# Patient Record
Sex: Male | Born: 1960 | Race: White | Hispanic: No | Marital: Single | Smoking: Current every day smoker
Health system: Southern US, Community
[De-identification: ages and names within clinical notes are randomized; demographics above are authoritative.]

## PROBLEM LIST (undated history)

## (undated) DIAGNOSIS — H9190 Unspecified hearing loss, unspecified ear: Secondary | ICD-10-CM

## (undated) DIAGNOSIS — I10 Essential (primary) hypertension: Secondary | ICD-10-CM

## (undated) DIAGNOSIS — F319 Bipolar disorder, unspecified: Secondary | ICD-10-CM

## (undated) DIAGNOSIS — R569 Unspecified convulsions: Secondary | ICD-10-CM

## (undated) DIAGNOSIS — R011 Cardiac murmur, unspecified: Secondary | ICD-10-CM

## (undated) DIAGNOSIS — F1010 Alcohol abuse, uncomplicated: Secondary | ICD-10-CM

## (undated) DIAGNOSIS — F329 Major depressive disorder, single episode, unspecified: Secondary | ICD-10-CM

## (undated) DIAGNOSIS — B1920 Unspecified viral hepatitis C without hepatic coma: Secondary | ICD-10-CM

## (undated) DIAGNOSIS — F32A Depression, unspecified: Secondary | ICD-10-CM

## (undated) HISTORY — DX: Depression, unspecified: F32.A

## (undated) HISTORY — PX: KNEE SURGERY: SHX244

## (undated) HISTORY — DX: Major depressive disorder, single episode, unspecified: F32.9

## (undated) HISTORY — DX: Bipolar disorder, unspecified: F31.9

## (undated) HISTORY — PX: HIP FRACTURE SURGERY: SHX118

---

## 2015-12-27 ENCOUNTER — Encounter (HOSPITAL_COMMUNITY): Payer: Self-pay | Admitting: *Deleted

## 2015-12-27 ENCOUNTER — Emergency Department (HOSPITAL_COMMUNITY)
Admission: EM | Admit: 2015-12-27 | Discharge: 2015-12-28 | Disposition: A | Discharge status: Other Acute Inpatient Hospital | Payer: Medicaid Other | Attending: Emergency Medicine | Admitting: Emergency Medicine

## 2015-12-27 DIAGNOSIS — Z87828 Personal history of other (healed) physical injury and trauma: Secondary | ICD-10-CM | POA: Insufficient documentation

## 2015-12-27 DIAGNOSIS — I2510 Atherosclerotic heart disease of native coronary artery without angina pectoris: Secondary | ICD-10-CM | POA: Insufficient documentation

## 2015-12-27 DIAGNOSIS — Z880 Allergy status to penicillin: Secondary | ICD-10-CM | POA: Insufficient documentation

## 2015-12-27 DIAGNOSIS — F322 Major depressive disorder, single episode, severe without psychotic features: Secondary | ICD-10-CM | POA: Insufficient documentation

## 2015-12-27 DIAGNOSIS — I10 Essential (primary) hypertension: Secondary | ICD-10-CM | POA: Insufficient documentation

## 2015-12-27 DIAGNOSIS — J029 Acute pharyngitis, unspecified: Secondary | ICD-10-CM | POA: Insufficient documentation

## 2015-12-27 DIAGNOSIS — F1010 Alcohol abuse, uncomplicated: Secondary | ICD-10-CM | POA: Insufficient documentation

## 2015-12-27 DIAGNOSIS — F17200 Nicotine dependence, unspecified, uncomplicated: Secondary | ICD-10-CM | POA: Insufficient documentation

## 2015-12-27 HISTORY — DX: Alcohol abuse, uncomplicated: F10.10

## 2015-12-27 HISTORY — DX: Essential (primary) hypertension: I10

## 2015-12-27 HISTORY — DX: Unspecified hearing loss, unspecified ear: H91.90

## 2015-12-27 LAB — RAPID URINE DRUG SCREEN, HOSP PERFORMED
Amphetamines: NOT DETECTED
Barbiturates: NOT DETECTED
Benzodiazepines: NOT DETECTED
COCAINE: NOT DETECTED
OPIATES: NOT DETECTED
TETRAHYDROCANNABINOL: NOT DETECTED

## 2015-12-27 LAB — COMPREHENSIVE METABOLIC PANEL
ALBUMIN: 3.6 g/dL (ref 3.5–5.0)
ALK PHOS: 106 U/L (ref 38–126)
ALT: 35 U/L (ref 17–63)
ANION GAP: 13 (ref 5–15)
AST: 21 U/L (ref 15–41)
BILIRUBIN TOTAL: 0.2 mg/dL — AB (ref 0.3–1.2)
BUN: 12 mg/dL (ref 6–20)
CALCIUM: 9 mg/dL (ref 8.9–10.3)
CO2: 24 mmol/L (ref 22–32)
Chloride: 103 mmol/L (ref 101–111)
Creatinine, Ser: 0.72 mg/dL (ref 0.61–1.24)
GFR calc Af Amer: >60 mL/min (ref >60)
GLUCOSE: 157 mg/dL — AB (ref 65–99)
POTASSIUM: 3.7 mmol/L (ref 3.5–5.1)
SODIUM: 140 mmol/L (ref 135–145)
TOTAL PROTEIN: 7 g/dL (ref 6.5–8.1)

## 2015-12-27 LAB — CBC
HCT: 46.5 % (ref 39.0–52.0)
HEMOGLOBIN: 15.5 g/dL (ref 13.0–17.0)
MCH: 30.7 pg (ref 26.0–34.0)
MCHC: 33.3 g/dL (ref 30.0–36.0)
MCV: 92.1 fL (ref 78.0–100.0)
PLATELETS: 267 10*3/uL (ref 150–400)
RBC: 5.05 MIL/uL (ref 4.22–5.81)
RDW: 13.2 % (ref 11.5–15.5)
WBC: 12.2 10*3/uL — ABNORMAL HIGH (ref 4.0–10.5)

## 2015-12-27 LAB — ETHANOL: ALCOHOL ETHYL (B): 222 mg/dL — AB (ref <5)

## 2015-12-27 MED ORDER — LORAZEPAM 1 MG PO TABS
1.0000 mg | ORAL_TABLET | Freq: Once | ORAL | Status: AC
  Administered 2015-12-27: 1 mg via ORAL
  Filled 2015-12-27: qty 1
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
## 2015-12-27 NOTE — ED Notes (Signed)
He had a 5th of vodka just before he came here

## 2015-12-27 NOTE — ED Notes (Signed)
Pt advised he is having a panic attack. States he lives with it constantly due to PTSD. No noted tachycardia or SOB. Vitals at baseline.  Turned down lights and pt appears more comfortable and stable.

## 2015-12-27 NOTE — Discharge Instructions (Signed)
If you were given medicines take as directed.  If you are on coumadin or contraceptives realize their levels and effectiveness is altered by many different medicines.  If you have any reaction (rash, tongues swelling, other) to the medicines stop taking and see a physician.    If your blood pressure was elevated in the ER make sure you follow up for management with a primary doctor or return for chest pain, shortness of breath or stroke symptoms.  Please follow up as directed and return to the ER or see a physician for new or worsening symptoms.  Thank you. Filed Vitals:   12/27/15 2136  BP: 125/82  Pulse: 100  Temp: 97.8 F (36.6 C)  TempSrc: Oral  Resp: 18  Weight: 231 lb 8 oz (105.008 kg)  SpO2: 95%

## 2015-12-27 NOTE — ED Notes (Signed)
Pt endorses wanting to go to sleep and not wake up. Denies suicidal ideation. Denies suicide plan.

## 2015-12-27 NOTE — ED Notes (Signed)
The pt has  Been in the bed for 2 weeks.  He has mouth abscesses and he has been drinking alcohol
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
## 2015-12-28 ENCOUNTER — Inpatient Hospital Stay (HOSPITAL_COMMUNITY)
Admission: AD | Admit: 2015-12-28 | Discharge: 2016-01-01 | DRG: 885 | Disposition: A | Discharge status: Home / Self Care | Payer: Medicaid Other | Source: Intra-hospital | Attending: Psychiatry | Admitting: Psychiatry

## 2015-12-28 ENCOUNTER — Encounter (HOSPITAL_COMMUNITY): Payer: Self-pay | Admitting: *Deleted

## 2015-12-28 DIAGNOSIS — Z23 Encounter for immunization: Secondary | ICD-10-CM

## 2015-12-28 DIAGNOSIS — I2510 Atherosclerotic heart disease of native coronary artery without angina pectoris: Secondary | ICD-10-CM | POA: Diagnosis present

## 2015-12-28 DIAGNOSIS — F410 Panic disorder [episodic paroxysmal anxiety] without agoraphobia: Secondary | ICD-10-CM | POA: Diagnosis present

## 2015-12-28 DIAGNOSIS — F10230 Alcohol dependence with withdrawal, uncomplicated: Secondary | ICD-10-CM | POA: Diagnosis present

## 2015-12-28 DIAGNOSIS — F17210 Nicotine dependence, cigarettes, uncomplicated: Secondary | ICD-10-CM | POA: Diagnosis present

## 2015-12-28 DIAGNOSIS — F332 Major depressive disorder, recurrent severe without psychotic features: Secondary | ICD-10-CM | POA: Diagnosis present

## 2015-12-28 DIAGNOSIS — I10 Essential (primary) hypertension: Secondary | ICD-10-CM | POA: Diagnosis present

## 2015-12-28 DIAGNOSIS — G4710 Hypersomnia, unspecified: Secondary | ICD-10-CM | POA: Diagnosis present

## 2015-12-28 DIAGNOSIS — F1020 Alcohol dependence, uncomplicated: Secondary | ICD-10-CM | POA: Diagnosis present

## 2015-12-28 DIAGNOSIS — F314 Bipolar disorder, current episode depressed, severe, without psychotic features: Secondary | ICD-10-CM | POA: Diagnosis present

## 2015-12-28 HISTORY — DX: Cardiac murmur, unspecified: R01.1

## 2015-12-28 LAB — RAPID STREP SCREEN (MED CTR MEBANE ONLY): STREPTOCOCCUS, GROUP A SCREEN (DIRECT): NEGATIVE

## 2015-12-28 MED ORDER — CHLORDIAZEPOXIDE HCL 25 MG PO CAPS: 25.0000 mg | ORAL_CAPSULE | Freq: Every day | ORAL | Status: DC

## 2015-12-28 MED ORDER — PNEUMOCOCCAL VAC POLYVALENT 25 MCG/0.5ML IJ INJ
0.5000 mL | INJECTION | INTRAMUSCULAR | Status: AC
  Administered 2015-12-29: 0.5 mL via INTRAMUSCULAR

## 2015-12-28 MED ORDER — ADULT MULTIVITAMIN W/MINERALS CH
1.0000 | ORAL_TABLET | Freq: Every day | ORAL | Status: DC
  Administered 2015-12-28: 1 via ORAL
  Filled 2015-12-28: qty 1

## 2015-12-28 MED ORDER — HYDROXYZINE HCL 25 MG PO TABS
25.0000 mg | ORAL_TABLET | Freq: Four times a day (QID) | ORAL | Status: AC | PRN
  Administered 2015-12-29 – 2015-12-31 (×2): 25 mg via ORAL
  Filled 2015-12-28 (×2): qty 1

## 2015-12-28 MED ORDER — ALUM & MAG HYDROXIDE-SIMETH 200-200-20 MG/5ML PO SUSP: 30.0000 mL | ORAL | Status: DC | PRN

## 2015-12-28 MED ORDER — MAGNESIUM HYDROXIDE 400 MG/5ML PO SUSP: 30.0000 mL | Freq: Every day | ORAL | Status: DC | PRN

## 2015-12-28 MED ORDER — NICOTINE 21 MG/24HR TD PT24
21.0000 mg | MEDICATED_PATCH | Freq: Once | TRANSDERMAL | Status: DC
  Administered 2015-12-28: 21 mg via TRANSDERMAL
  Filled 2015-12-28: qty 1

## 2015-12-28 MED ORDER — LOPERAMIDE HCL 2 MG PO CAPS: 2.0000 mg | ORAL_CAPSULE | ORAL | Status: AC | PRN

## 2015-12-28 MED ORDER — CHLORDIAZEPOXIDE HCL 25 MG PO CAPS: 25.0000 mg | ORAL_CAPSULE | Freq: Three times a day (TID) | ORAL | Status: DC

## 2015-12-28 MED ORDER — LORAZEPAM 2 MG/ML IJ SOLN
1.0000 mg | Freq: Four times a day (QID) | INTRAMUSCULAR | Status: DC | PRN
  Administered 2015-12-28: 1 mg via INTRAVENOUS
  Filled 2015-12-28 (×2): qty 1

## 2015-12-28 MED ORDER — FOLIC ACID 1 MG PO TABS
1.0000 mg | ORAL_TABLET | Freq: Every day | ORAL | Status: DC
  Administered 2015-12-28: 1 mg via ORAL
  Filled 2015-12-28: qty 1

## 2015-12-28 MED ORDER — CHLORDIAZEPOXIDE HCL 25 MG PO CAPS
25.0000 mg | ORAL_CAPSULE | Freq: Four times a day (QID) | ORAL | Status: DC
  Administered 2015-12-28 – 2015-12-29 (×3): 25 mg via ORAL
  Filled 2015-12-28 (×3): qty 1

## 2015-12-28 MED ORDER — ENSURE ENLIVE PO LIQD
237.0000 mL | Freq: Two times a day (BID) | ORAL | Status: DC
  Administered 2015-12-31: 237 mL via ORAL

## 2015-12-28 MED ORDER — LORAZEPAM 1 MG PO TABS: 0.0000 mg | ORAL_TABLET | Freq: Two times a day (BID) | ORAL | Status: DC

## 2015-12-28 MED ORDER — VITAMIN B-1 100 MG PO TABS
100.0000 mg | ORAL_TABLET | Freq: Every day | ORAL | Status: DC
  Administered 2015-12-29 – 2016-01-01 (×4): 100 mg via ORAL
  Filled 2015-12-28 (×6): qty 1

## 2015-12-28 MED ORDER — THIAMINE HCL 100 MG/ML IJ SOLN
100.0000 mg | Freq: Every day | INTRAMUSCULAR | Status: DC
  Administered 2015-12-28: 100 mg via INTRAVENOUS
  Filled 2015-12-28: qty 2

## 2015-12-28 MED ORDER — PANTOPRAZOLE SODIUM 40 MG IV SOLR
40.0000 mg | Freq: Once | INTRAVENOUS | Status: AC
  Administered 2015-12-28: 40 mg via INTRAVENOUS
  Filled 2015-12-28: qty 40

## 2015-12-28 MED ORDER — NICOTINE 21 MG/24HR TD PT24: 21.0000 mg | MEDICATED_PATCH | Freq: Every day | TRANSDERMAL | Status: DC

## 2015-12-28 MED ORDER — ADULT MULTIVITAMIN W/MINERALS CH
1.0000 | ORAL_TABLET | Freq: Every day | ORAL | Status: DC
  Administered 2015-12-29: 1 via ORAL
  Filled 2015-12-28 (×2): qty 1

## 2015-12-28 MED ORDER — LORAZEPAM 2 MG/ML IJ SOLN
2.0000 mg | Freq: Once | INTRAMUSCULAR | Status: AC
  Administered 2015-12-28: 2 mg via INTRAVENOUS

## 2015-12-28 MED ORDER — ONDANSETRON HCL 4 MG PO TABS: 4.0000 mg | ORAL_TABLET | Freq: Three times a day (TID) | ORAL | Status: DC | PRN

## 2015-12-28 MED ORDER — LORAZEPAM 1 MG PO TABS: 1.0000 mg | ORAL_TABLET | Freq: Four times a day (QID) | ORAL | Status: DC | PRN

## 2015-12-28 MED ORDER — THIAMINE HCL 100 MG/ML IJ SOLN: 100.0000 mg | Freq: Once | INTRAMUSCULAR | Status: DC

## 2015-12-28 MED ORDER — CHLORDIAZEPOXIDE HCL 25 MG PO CAPS: 25.0000 mg | ORAL_CAPSULE | ORAL | Status: DC

## 2015-12-28 MED ORDER — SODIUM CHLORIDE 0.9 % IV BOLUS (SEPSIS)
1000.0000 mL | Freq: Once | INTRAVENOUS | Status: AC
  Administered 2015-12-28: 1000 mL via INTRAVENOUS

## 2015-12-28 MED ORDER — ACETAMINOPHEN 325 MG PO TABS: 650.0000 mg | ORAL_TABLET | ORAL | Status: DC | PRN

## 2015-12-28 MED ORDER — ACETAMINOPHEN 325 MG PO TABS
650.0000 mg | ORAL_TABLET | Freq: Four times a day (QID) | ORAL | Status: DC | PRN
  Administered 2015-12-28 – 2015-12-30 (×3): 650 mg via ORAL
  Filled 2015-12-28 (×3): qty 2

## 2015-12-28 MED ORDER — VITAMIN B-1 100 MG PO TABS: 100.0000 mg | ORAL_TABLET | Freq: Every day | ORAL | Status: DC

## 2015-12-28 MED ORDER — ONDANSETRON 4 MG PO TBDP
4.0000 mg | ORAL_TABLET | Freq: Four times a day (QID) | ORAL | Status: AC | PRN
  Administered 2015-12-29: 4 mg via ORAL
  Filled 2015-12-28: qty 1

## 2015-12-28 MED ORDER — NICOTINE 21 MG/24HR TD PT24
21.0000 mg | MEDICATED_PATCH | Freq: Every day | TRANSDERMAL | Status: DC
  Administered 2015-12-29 – 2016-01-01 (×4): 21 mg via TRANSDERMAL
  Filled 2015-12-28 (×7): qty 1

## 2015-12-28 MED ORDER — LORAZEPAM 1 MG PO TABS
0.0000 mg | ORAL_TABLET | Freq: Four times a day (QID) | ORAL | Status: DC
  Administered 2015-12-28: 1 mg via ORAL
  Filled 2015-12-28: qty 1

## 2015-12-28 MED ORDER — CHLORDIAZEPOXIDE HCL 25 MG PO CAPS: 25.0000 mg | ORAL_CAPSULE | Freq: Four times a day (QID) | ORAL | Status: DC | PRN

## 2015-12-28 MED ORDER — ONDANSETRON HCL 4 MG/2ML IJ SOLN
4.0000 mg | INTRAMUSCULAR | Status: AC | PRN
  Administered 2015-12-28 (×2): 4 mg via INTRAVENOUS
  Filled 2015-12-28 (×2): qty 2

## 2015-12-28 MED ORDER — LORAZEPAM 1 MG PO TABS: 1.0000 mg | ORAL_TABLET | Freq: Three times a day (TID) | ORAL | Status: DC | PRN

## 2015-12-28 MED ORDER — INFLUENZA VAC SPLIT QUAD 0.5 ML IM SUSY
0.5000 mL | PREFILLED_SYRINGE | INTRAMUSCULAR | Status: AC
  Administered 2015-12-29: 0.5 mL via INTRAMUSCULAR
  Filled 2015-12-28: qty 0.5

## 2015-12-28 NOTE — ED Notes (Signed)
Pt signed consent forms - copy faxed to [facility] and copy sent to medical records.

## 2015-12-28 NOTE — ED Notes (Addendum)
Ginger Ale given to pt. Pt woke briefly and advised "OK" when asked to drink po fluid given. Pt then returned to sleeping.

## 2015-12-28 NOTE — ED Notes (Signed)
Pt spoke w/girlfriend on phone and returned to room.

## 2015-12-28 NOTE — ED Notes (Signed)
Woke pt - pt took a few sips of Ginger Ale and returned to sleeping.

## 2015-12-28 NOTE — Progress Notes (Signed)
Pt accepted at [facility], 303-2 Dr. Dub Mikes Attending, Maryjean Morn accepting pending medical clearance from ED.  Seward Speck [facility] Behavioral Health Disposition CSW [phone]

## 2015-12-28 NOTE — ED Notes (Addendum)
Pt attempted to eat and started violently vomiting.  Dr Clydene Pugh notified.

## 2015-12-28 NOTE — ED Notes (Signed)
Pt to receive meal tray. 

## 2015-12-28 NOTE — ED Notes (Signed)
Attempted to call report to [facility] - no answer x 2.

## 2015-12-28 NOTE — Tx Team (Signed)
Initial Interdisciplinary Treatment Plan   PATIENT STRESSORS: Financial difficulties Health problems Substance abuse   PATIENT STRENGTHS: Ability for insight Active sense of humor Average or above average intelligence Capable of independent living Communication skills General fund of knowledge Supportive family/friends   PROBLEM LIST: Problem List/Patient Goals Date to be addressed Date deferred Reason deferred Estimated date of resolution  "coping mechanisms for my alcohol"  12/28/15     "my depression"  12/28/15           SI 12/28/15     Substance abuse 12/28/15     depression 12/28/15                        DISCHARGE CRITERIA:  Improved stabilization in mood, thinking, and/or behavior Withdrawal symptoms are absent or subacute and managed without 24-hour nursing intervention  PRELIMINARY DISCHARGE PLAN: Attend 12-step recovery group Outpatient therapy  PATIENT/FAMIILY INVOLVEMENT: This treatment plan has been presented to and reviewed with the patient, [employer].  The patient and family have been given the opportunity to ask questions and make suggestions.  Arrie Aran 12/28/2015, 10:47 PM

## 2015-12-28 NOTE — Progress Notes (Addendum)
Disposition CSW faxed referral to [facility] for possible detox bed. [facility] declined patient stating he needed higher level of care.  Seward Speck [facility] Behavioral Health Disposition CSW [phone]

## 2015-12-28 NOTE — ED Notes (Signed)
Woke pt from sleeping. States continues to feel some nausea and headache and states "room felt like it was spinning a few minutes ago". IV fluids continuing. Pt returned to sleeping rather quickly. Snorous respirations noted intermittently.

## 2015-12-28 NOTE — ED Notes (Signed)
Pt placed on 2L [place] for sats of 88% while sleeping.

## 2015-12-28 NOTE — ED Notes (Signed)
Pt is sleeping at present.

## 2015-12-28 NOTE — ED Notes (Signed)
Pt sleeping. 

## 2015-12-28 NOTE — ED Notes (Signed)
Per [facility], requested for report to be given after shift change.
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
## 2015-12-28 NOTE — BH Assessment (Signed)
Assessment Note  Steven Daniels is an 55 y.o. male, who reports driving himself to [facility].  Patient presented orientated x4, mood "depressed, and affect congruent with mood.  The Veteran expressed vague SI in terms of "if I go to sleep and don't wake up, I will be okay with that."  He denied HI and AVH.  Patient reports remaining in bed for the past 3 weeks consuming alcohol and not taking care of daily hygiene needs.  He reports his Father died in Jan 25, 2017and that is when his alcohol consumption increased.  The Patient reports his Spouse was recently hospitalized for schizophrenia.  Patient reports being on SSI for PTSD.  He reports mental health diagnoses of PTSD and Bipolar I.  Patient reports previously being prescribed Prozac, Neurontin, and Trazodone, however he has not been med compliant for " a long time."  Patient reports consuming a 5th of liquor daily starting at wakening, if not he will experience alcohol withdrawal symptoms.  Patient reports snorting and smoking Meth with last use a month ago.  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   He reports participating in substance rehab in 2000.  The Patient reports no history of psychiatric hospitalization.  Patient reports wanting substance use rehabilitation.       .     Diagnosis: Alcohol Use Disorder, severe, Bipolar I Disorder, current episode depressed, severe  Past Medical History:  Past Medical History  Diagnosis Date  . Hypertension   . Coronary artery disease     History reviewed. No pertinent past surgical history.  Family History: No family history on file.  Social History:  reports that he has been smoking.  He does not have any smokeless tobacco history on file. He reports that he drinks alcohol. His drug history is not on file.  Additional Social History:     CIWA: CIWA-Ar BP: 125/82 mmHg Pulse Rate: 100 COWS:    Allergies:  Allergies  Allergen Reactions  . Other Shortness Of Breath    Reaction to  cats  . Apple Other (See Comments)    Raw apples cause gum swelling  . Cherry Other (See Comments)    Raw cherries cause gum swelling  . Lisinopril Hives    Possible reaction to lisinopril  . Penicillins Other (See Comments)    Allergic reaction as a 55 year old  . Plum Pulp Other (See Comments)    Raw plums cause gum swelling    Home Medications:  (Not in a hospital admission)  OB/GYN Status:  No LMP for male patient.  General Assessment Data Location of Assessment: [facility] ED TTS Assessment: In system Is this a Tele or Face-to-Face Assessment?: Tele Assessment Is this an Initial Assessment or a Re-assessment for this encounter?: Initial Assessment Marital status: Married [place] name: N/A Is patient pregnant?: No Pregnancy Status: No Living Arrangements: Spouse/significant other Can pt return to current living arrangement?: Yes Admission Status: Voluntary Is patient capable of signing voluntary admission?: Yes Referral Source: Self/Family/Friend Insurance type: Medicaid, [place]  Medical Screening Exam [facility] Walk-in ONLY) Medical Exam completed: Yes  Crisis Care Plan Living Arrangements: Spouse/significant other Legal Guardian: Other: (Self) Name of Psychiatrist: None Name of Therapist: None  Education Status Is patient currently in school?: No Current Grade: N/A Highest grade of school patient has completed: 12th Name of school: N/A Contact person: N/A  Risk to self with the past 6 months Suicidal Ideation: Yes-Currently Present (Vague SI) Has patient been a risk to self  within the past 6 months prior to admission? : No Suicidal Intent: No Has patient had any suicidal intent within the past 6 months prior to admission? : No Is patient at risk for suicide?: No Suicidal Plan?: No Has patient had any suicidal plan within the past 6 months prior to admission? : No Access to Means: No What has been your use of drugs/alcohol within the last 12 months?: ETOH, Meth Previous  Attempts/Gestures: No How many times?: 0 Other Self Harm Risks: No Triggers for Past Attempts: None known Intentional Self Injurious Behavior: None Family Suicide History: Unknown Recent stressful life event(s): Loss (Comment) (Father died 11/10/2015 and Spouse hospitalized for MH) Persecutory voices/beliefs?: No Depression: Yes Depression Symptoms: Feeling worthless/self pity, Loss of interest in usual pleasures, Fatigue Substance abuse history and/or treatment for substance abuse?: Yes (HX of THC and IV herion) Suicide prevention information given to non-admitted patients: Not applicable  Risk to Others within the past 6 months Homicidal Ideation: No Does patient have any lifetime risk of violence toward others beyond the six months prior to admission? : No Thoughts of Harm to Others: No Current Homicidal Intent: No Current Homicidal Plan: No Access to Homicidal Means: No Identified Victim: N/A History of harm to others?: No Assessment of Violence: None Noted Violent Behavior Description: N/A Does patient have access to weapons?: No (Patient denied) Criminal Charges Pending?: No Does patient have a court date: No Is patient on probation?: No  Psychosis Hallucinations: None noted Delusions: None noted  Mental Status Report Appearance/Hygiene: In hospital gown Eye Contact: Good Motor Activity: Unremarkable Speech: Logical/coherent Level of Consciousness: Alert Mood: Depressed, Other (Comment) (Hopeless) Affect: Depressed Anxiety Level: Minimal Thought Processes: Coherent, Relevant Judgement: Impaired Orientation: Person, Place, Time, Situation Obsessive Compulsive Thoughts/Behaviors: None  Cognitive Functioning Concentration: Decreased Memory: Recent Intact, Remote Intact IQ: Average Insight: Poor Impulse Control: Poor Appetite: Good Weight Loss: 0 Weight Gain: 0 Sleep: Increased Total Hours of Sleep: 12 Vegetative Symptoms: Staying in bed, Not  bathing  ADLScreening [facility] Assessment Services) Patient's cognitive ability adequate to safely complete daily activities?: No Patient able to express need for assistance with ADLs?: Yes Independently performs ADLs?: Yes (appropriate for developmental age)  Prior Inpatient Therapy Prior Inpatient Therapy: Yes (Substance use residential treatment) Prior Therapy Dates: 2000 Prior Therapy Facilty/Provider(s): Unknown Reason for Treatment: alcohol dependency  Prior Outpatient Therapy Prior Outpatient Therapy: No Prior Therapy Dates: N/A Prior Therapy Facilty/Provider(s): N/A Reason for Treatment: N/A Does patient have an ACCT team?: No Does patient have Intensive In-House Services?  : No Does patient have [facility] services? : No Does patient have P4CC services?: No  ADL Screening (condition at time of admission) Patient's cognitive ability adequate to safely complete daily activities?: No Is the patient deaf or have difficulty hearing?: No Does the patient have difficulty seeing, even when wearing glasses/contacts?: No Does the patient have difficulty concentrating, remembering, or making decisions?: No Patient able to express need for assistance with ADLs?: Yes Does the patient have difficulty dressing or bathing?: No Independently performs ADLs?: Yes (appropriate for developmental age) Does the patient have difficulty walking or climbing stairs?: No Weakness of Legs: None Weakness of Arms/Hands: None  Home Assistive Devices/Equipment Home Assistive Devices/Equipment: None    Abuse/Neglect Assessment (Assessment to be complete while patient is alone) Physical Abuse: Denies Verbal Abuse: Denies Sexual Abuse: Denies Exploitation of patient/patient's resources: Denies Self-Neglect: Denies Values / Beliefs Cultural Requests During Hospitalization: None Spiritual Requests During Hospitalization: None   Advance Directives (For Healthcare) Does  patient have an advance  directive?: No Would patient like information on creating an advanced directive?: No - patient declined information    Additional Information 1:1 In Past 12 Months?: No CIRT Risk: No Elopement Risk: No Does patient have medical clearance?: Yes     Disposition:  Disposition Initial Assessment Completed for this Encounter: Yes Disposition of Patient: Other dispositions (Pending) Other disposition(s): Other (Comment) (Pending)  On Site Evaluation by:   Reviewed with Physician:    Dey-Johnson,Emillio Ngo 12/28/2015 12:04 AM

## 2015-12-28 NOTE — ED Notes (Signed)
Security at bedside to wand pt. 

## 2015-12-28 NOTE — ED Notes (Addendum)
Pt more alert now and CIWA has decreased. In agreement to go to [facility].

## 2015-12-28 NOTE — ED Notes (Signed)
Per Simonne Come, [facility] - pt has bed 303-2 - Dr Dub Mikes when cleared.

## 2015-12-29 ENCOUNTER — Encounter (HOSPITAL_COMMUNITY): Payer: Self-pay | Admitting: Psychiatry

## 2015-12-29 DIAGNOSIS — F1020 Alcohol dependence, uncomplicated: Secondary | ICD-10-CM | POA: Diagnosis present

## 2015-12-29 DIAGNOSIS — F332 Major depressive disorder, recurrent severe without psychotic features: Principal | ICD-10-CM | POA: Diagnosis present

## 2015-12-29 DIAGNOSIS — F10230 Alcohol dependence with withdrawal, uncomplicated: Secondary | ICD-10-CM

## 2015-12-29 MED ORDER — LORAZEPAM 1 MG PO TABS
1.0000 mg | ORAL_TABLET | Freq: Every day | ORAL | Status: AC
  Administered 2016-01-01: 1 mg via ORAL
  Filled 2015-12-29: qty 1

## 2015-12-29 MED ORDER — THIAMINE HCL 100 MG/ML IJ SOLN
100.0000 mg | Freq: Once | INTRAMUSCULAR | Status: DC
Start: 2015-12-29 — End: 2015-12-29

## 2015-12-29 MED ORDER — ADULT MULTIVITAMIN W/MINERALS CH
1.0000 | ORAL_TABLET | Freq: Every day | ORAL | Status: DC
  Administered 2015-12-30 – 2016-01-01 (×3): 1 via ORAL
  Filled 2015-12-29 (×6): qty 1

## 2015-12-29 MED ORDER — LORAZEPAM 1 MG PO TABS
1.0000 mg | ORAL_TABLET | Freq: Three times a day (TID) | ORAL | Status: AC
  Administered 2015-12-30 (×3): 1 mg via ORAL
  Filled 2015-12-29 (×2): qty 1

## 2015-12-29 MED ORDER — LORAZEPAM 1 MG PO TABS: 1.0000 mg | ORAL_TABLET | Freq: Four times a day (QID) | ORAL | Status: DC | PRN

## 2015-12-29 MED ORDER — VITAMIN B-1 100 MG PO TABS
100.0000 mg | ORAL_TABLET | Freq: Every day | ORAL | Status: DC
  Filled 2015-12-29: qty 1

## 2015-12-29 MED ORDER — SULFAMETHOXAZOLE-TRIMETHOPRIM 800-160 MG PO TABS
1.0000 | ORAL_TABLET | Freq: Two times a day (BID) | ORAL | Status: DC
  Filled 2015-12-29 (×5): qty 1

## 2015-12-29 MED ORDER — ONDANSETRON 4 MG PO TBDP: 4.0000 mg | ORAL_TABLET | Freq: Four times a day (QID) | ORAL | Status: DC | PRN

## 2015-12-29 MED ORDER — LOPERAMIDE HCL 2 MG PO CAPS: 2.0000 mg | ORAL_CAPSULE | ORAL | Status: DC | PRN

## 2015-12-29 MED ORDER — BUSPIRONE HCL 10 MG PO TABS
10.0000 mg | ORAL_TABLET | Freq: Two times a day (BID) | ORAL | Status: DC
Start: 2015-12-29 — End: 2015-12-31
  Administered 2015-12-29 – 2015-12-31 (×5): 10 mg via ORAL
  Filled 2015-12-29 (×8): qty 1

## 2015-12-29 MED ORDER — HYDROXYZINE HCL 25 MG PO TABS: 25.0000 mg | ORAL_TABLET | Freq: Four times a day (QID) | ORAL | Status: DC | PRN

## 2015-12-29 MED ORDER — LORAZEPAM 1 MG PO TABS
1.0000 mg | ORAL_TABLET | Freq: Two times a day (BID) | ORAL | Status: AC
  Administered 2015-12-31 (×2): 1 mg via ORAL
  Filled 2015-12-29 (×4): qty 1

## 2015-12-29 MED ORDER — IBUPROFEN 600 MG PO TABS
600.0000 mg | ORAL_TABLET | Freq: Four times a day (QID) | ORAL | Status: DC | PRN
Start: 2015-12-29 — End: 2015-12-31
  Administered 2015-12-31 (×2): 600 mg via ORAL
  Filled 2015-12-29 (×2): qty 1

## 2015-12-29 MED ORDER — LORAZEPAM 1 MG PO TABS
1.0000 mg | ORAL_TABLET | ORAL | Status: DC | PRN
  Administered 2015-12-30 – 2015-12-31 (×3): 1 mg via ORAL
  Filled 2015-12-29 (×3): qty 1

## 2015-12-29 MED ORDER — FLUOXETINE HCL 20 MG PO CAPS
20.0000 mg | ORAL_CAPSULE | Freq: Every day | ORAL | Status: DC
  Administered 2015-12-29 – 2016-01-01 (×4): 20 mg via ORAL
  Filled 2015-12-29 (×7): qty 1

## 2015-12-29 MED ORDER — LORAZEPAM 1 MG PO TABS
1.0000 mg | ORAL_TABLET | Freq: Four times a day (QID) | ORAL | Status: AC
  Administered 2015-12-29 (×3): 1 mg via ORAL
  Filled 2015-12-29 (×3): qty 1

## 2015-12-29 MED ORDER — DOXYCYCLINE HYCLATE 100 MG PO TABS
100.0000 mg | ORAL_TABLET | Freq: Two times a day (BID) | ORAL | Status: DC
  Administered 2015-12-29 – 2016-01-01 (×7): 100 mg via ORAL
  Filled 2015-12-29 (×11): qty 1

## 2015-12-29 NOTE — Progress Notes (Signed)
Adult Psychoeducational Group Note  Date:  12/29/2015 Time:  9:24 PM  Group Topic/Focus:  Wrap-Up Group:   The focus of this group is to help patients review their daily goal of treatment and discuss progress on daily workbooks.  Participation Level:  Active  Participation Quality:  Appropriate and Attentive  Affect:  Appropriate  Cognitive:  Appropriate  Insight: Appropriate and Good  Engagement in Group:  Engaged  Modes of Intervention:  Discussion  Additional Comments:  Pt rated his day a 4 out of 10. Pt mentioned his day started off bad until he talked with his doctor and got a general ideal of what he was here for. Pt goal for tomorrow is to get more exercise to stretch out his bones.   Merlinda Frederick 12/29/2015, 9:24 PM

## 2015-12-29 NOTE — BHH Group Notes (Signed)
[facility] LCSW Aftercare Discharge Planning Group Note   12/29/2015 10:43 AM  Participation Quality:  Invited .DID NOT ATTEND. Pt chose to remain in bed.   Smart, Dejon Jungman LCSW

## 2015-12-29 NOTE — BHH Group Notes (Signed)
[facility] LCSW Group Therapy  12/29/2015 3:30 PM  Type of Therapy:  Group Therapy  Participation Level:  Did Not Attend-invited. Chose to remain in bed.   Summary of Progress/Problems: Today's Topic: Overcoming Obstacles. Patients identified one short term goal and potential obstacles in reaching this goal. Patients processed barriers involved in overcoming these obstacles. Patients identified steps necessary for overcoming these obstacles and explored motivation (internal and external) for facing these difficulties head on.   Smart, Lastacia Solum LCSW 12/29/2015, 3:30 PM

## 2015-12-29 NOTE — Progress Notes (Signed)
NUTRITION ASSESSMENT  Pt identified as at risk on the Malnutrition Screen Tool  INTERVENTION: 1. Educated patient on the importance of nutrition and encouraged intake of food and beverages. 2. Discussed weight goals. 3. Supplements: continue Ensure Enlive po BID, each supplement provides 350 kcal and 20 grams of protein   NUTRITION DIAGNOSIS: Unintentional weight loss related to sub-optimal intake as evidenced by pt report.   Goal: Pt to meet >/= 90% of their estimated nutrition needs.  Monitor:  PO intake  Assessment:  Pt seen for MST. Pt admitted with bipolar disorder, substance abuse, and SI. He drinks ~1/5 liquor/day. He also states that due to lifestyle choices he has lost ~20 lbs in the past few months. No previous weight hx available in the chart. A 20 lb weight loss would indicate 8% body weight loss in unknown time frame.   Ensure Shiela Mayer has already been ordered BID.  55 y.o. male  Height: Ht Readings from Last 1 Encounters:  12/28/15  (1.905 m)    Weight: Wt Readings from Last 1 Encounters:  12/28/15 226 lb (102.513 kg)    Weight Hx: Wt Readings from Last 10 Encounters:  12/28/15 226 lb (102.513 kg)  12/27/15 231 lb 8 oz (105.008 kg)    BMI:  Body mass index is 28.25 kg/(m^2). Pt meets criteria for overweight based on current BMI.  Estimated Nutritional Needs: Kcal: 25-30 kcal/kg Protein: > 1 gram protein/kg Fluid: 1 ml/kcal  Diet Order: Diet regular Room service appropriate?: Yes; Fluid consistency:: Thin Pt is also offered choice of unit snacks mid-morning and mid-afternoon.  Pt is eating as desired.   Lab results and medications reviewed.     Trenton Gammon, RD, LDN Inpatient Clinical Dietitian Pager # [phone] After hours/weekend pager # [phone]

## 2015-12-29 NOTE — Progress Notes (Signed)
Patient ID: Steven Daniels, male   DOB: November 17, 1961, 55 y.o.   MRN: [id number]   Pt currently presents with a flat affect and anxious behavior. Per self inventory, pt rates depression at a 9, hopelessness 9 and anxiety 4. Pt's daily goal is "less pain" and they intend to do so by "sleep." Pt reports fair sleep, a fair appetite and normal concentration. Pt anxious this morning, relaxes this afternoon after speaking with MD. Pt cooperative.   Pt provided with medications per providers orders. Pt's labs and vitals were monitored throughout the day. Pt supported emotionally and encouraged to express concerns and questions. Pt educated on medications, vaccinations and medication side effects.   Pt's safety ensured with 15 minute and environmental checks. Pt currently denies SI/HI and A/V hallucinations. Pt verbally agrees to seek staff if SI/HI or A/VH occurs and to consult with staff before acting on these thoughts. Will continue POC.

## 2015-12-29 NOTE — BHH Counselor (Signed)
CSW attempted to complete Psycho social assessment/discuss aftercare with pt today. Pt remains sleeping in room/unable to participate in treatment plan at this time. He has not been attending groups today. CSW to reassess in the morning.   Trula Slade, MSW, LCSW Clinical Social Worker 12/29/2015 3:31 PM
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
## 2015-12-29 NOTE — Tx Team (Signed)
Interdisciplinary Treatment Plan Update (Adult)  Date:  12/29/2015  Time Reviewed:  8:49 AM   Progress in Treatment: Attending groups: No. New to unit. Continuing to assess. Participating in groups:  No. Taking medication as prescribed:  Yes. Tolerating medication:  Yes. Family/Significant othe contact made:  SPE required for this pt.  Patient understands diagnosis:  Yes. and As evidenced by:  seeking treatment for passive SI, increased depression/grief, ETOH abuse, hx of polysubstance abuse, and for medication stabilization.  Discussing patient identified problems/goals with staff:  Yes. Medical problems stabilized or resolved:  Yes. Denies suicidal/homicidal ideation: Yes. Issues/concerns per patient self-inventory:  Other:  Discharge Plan or Barriers: CSW assessing for appropriate referrals.   Reason for Continuation of Hospitalization: Depression Medication stabilization Withdrawal symptoms  Comments:  Steven Daniels is an 55 y.o. male, who reports driving himself to [facility]. Patient presented orientated x4, mood "depressed, and affect congruent with mood. The Veteran expressed vague SI in terms of "if I go to sleep and don't wake up, I will be okay with that." He denied HI and AVH. Patient reports remaining in bed for the past 3 weeks consuming alcohol and not taking care of daily hygiene needs. He reports his Father died in 2015/11/07 and that is when his alcohol consumption increased. The Patient reports his Spouse was recently hospitalized for schizophrenia. Patient reports being on SSI for PTSD. He reports mental health diagnoses of PTSD and Bipolar I. Patient reports previously being prescribed Prozac, Neurontin, and Trazodone, however he has not been med compliant for " a long time." Patient reports consuming a 5th of liquor daily starting at wakening, if not he will experience alcohol withdrawal symptoms. Patient reports snorting and smoking Meth with last use a month  ago. 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 He reports participating in substance rehab in 2000. The Patient reports no history of psychiatric hospitalization. Patient reports wanting substance use rehabilitation.Diagnosis: Alcohol Use Disorder, severe, Bipolar I Disorder, current episode depressed, severe  Estimated length of stay:  3-5 days   New goal(s): to develop effective aftercare plan.   Additional Comments:  Patient and CSW reviewed pt's identified goals and treatment plan. Patient verbalized understanding and agreed to treatment plan. CSW reviewed [facility] "Discharge Process and Patient Involvement" Form. Pt verbalized understanding of information provided and signed form.    Review of initial/current patient goals per problem list:  1. Goal(s): Patient will participate in aftercare plan  Met: No.   Target date: at discharge  As evidenced by: Patient will participate within aftercare plan AEB aftercare provider and housing plan at discharge being identified.  1/30: CSW assessing for appropriate referrals. Pt did not attend morning d/c planning group.   2. Goal (s): Patient will exhibit decreased depressive symptoms and suicidal ideations.  Met: No.    Target date: at discharge  As evidenced by: Patient will utilize self rating of depression at 3 or below and demonstrate decreased signs of depression or be deemed stable for discharge by MD.  1/30: Pt rates depression as high. Denies Si/HI/AVH today.   3. Goal(s): Patient will demonstrate decreased signs of withdrawal due to substance abuse  Met:No.   Target date:at discharge   As evidenced by: Patient will produce a CIWA/COWS score of 0, have stable vitals signs, and no symptoms of withdrawal.  1/30: Pt reports moderate/severe withdrawals with CIWA score of 10 and stable vitals.   Attendees: Patient:   12/29/2015 8:49 AM   Family:  12/29/2015 8:49 AM   Physician:  Dr. Carlton Adam, MD  12/29/2015 8:49 AM   Nursing:   Natale Milch RN 12/29/2015 8:49 AM   Clinical Social Worker: Maxie Better, LCSW 12/29/2015 8:49 AM   Clinical Social Worker: Erasmo Downer Drinkard LCSWA; Peri Maris LCSWA 12/29/2015 8:49 AM   Other:  Gerline Legacy Nurse Case Manager 12/29/2015 8:49 AM   Other:   12/29/2015 8:49 AM   Other:   12/29/2015 8:49 AM   Other:  12/29/2015 8:49 AM   Other:  12/29/2015 8:49 AM   Other:  12/29/2015 8:49 AM    12/29/2015 8:49 AM    12/29/2015 8:49 AM    12/29/2015 8:49 AM    12/29/2015 8:49 AM    Scribe for Treatment Team:   Maxie Better, LCSW 12/29/2015 8:49 AM
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
## 2015-12-29 NOTE — Plan of Care (Signed)
Problem: Alteration in mood & ability to function due to Goal: LTG-Pt reports reduction in suicidal thoughts (Patient reports reduction in suicidal thoughts and is able to verbalize a safety plan for whenever patient is feeling suicidal)  Outcome: Progressing Pt denies any current SI

## 2015-12-30 LAB — TSH: TSH: 0.777 u[IU]/mL (ref 0.350–4.500)

## 2015-12-30 LAB — LIPID PANEL
CHOL/HDL RATIO: 3 ratio
CHOLESTEROL: 236 mg/dL — AB (ref 0–200)
HDL: 80 mg/dL (ref >40)
LDL Cholesterol: 124 mg/dL — ABNORMAL HIGH (ref 0–99)
TRIGLYCERIDES: 162 mg/dL — AB (ref <150)
VLDL: 32 mg/dL (ref 0–40)

## 2015-12-30 LAB — CULTURE, GROUP A STREP (THRC)

## 2015-12-30 MED ORDER — PROPRANOLOL HCL 10 MG PO TABS
10.0000 mg | ORAL_TABLET | Freq: Three times a day (TID) | ORAL | Status: DC
  Administered 2015-12-30 – 2016-01-01 (×6): 10 mg via ORAL
  Filled 2015-12-30 (×11): qty 1

## 2015-12-30 NOTE — [facility] Counselor (Signed)
CSW attempted to meet with pt for a second time to complete PSA. He was in bed sleeping and did not wake up when called. CSW will make another attempt in the morning.   Trula Slade, MSW, LCSW Clinical Social Worker 12/30/2015 3:33 PM

## 2015-12-30 NOTE — Progress Notes (Signed)
Patient ID: Steven Daniels, male   DOB: [birth year], 55 y.o.   MRN: [id number]  Adult Psychoeducational Group Note  Date:  12/30/2015 Time:  09:00am  Group Topic/Focus:  Orientation:   The focus of this group is to educate the patient on the purpose and policies of crisis stabilization and provide a format to answer questions about their admission.  The group details unit policies and expectations of patients while admitted.  Participation Level:  Did Not Attend  Participation Quality: n/a  Affect: n/a  Cognitive: n/a  Insight:n/a  Engagement in Group: n/a  Modes of Intervention:  Education, Orientation and Support  Additional Comments:  Pt chose not to attend group, pt in bed asleep.   Aurora Mask 12/30/2015, 2:08 PM

## 2015-12-30 NOTE — Progress Notes (Signed)
Recreation Therapy Notes  Animal-Assisted Activity (AAA) Program Checklist/Progress Notes Patient Eligibility Criteria Checklist & Daily Group note for Rec Tx Intervention  Date: 01.31.2017 Time: 2:45pm Location: [address] Dayroom   AAA/T Program Assumption of Risk Form signed by Patient/ or Parent Legal Guardian yes  Patient is free of allergies or sever asthma yes  Patient reports no fear of animals yes  Patient reports no history of cruelty to animals yes  Patient understands his/her participation is voluntary yes  Behavioral Response: Did not attend.   Ha Placeres L Damarco Keysor, LRT/CTRS  Nicholes Hibler L 12/30/2015 3:20 PM 

## 2015-12-30 NOTE — Progress Notes (Signed)
Patient ID: Steven Daniels, male   DOB: [birth year], 55 y.o.   MRN: [id number]  Pt currently presents with a flat affect and depressed behavior. Pt remains in bed for most of the day, complains of withdrawal symptoms that lessen throughout the day. Pt complains of abdominal cramping, anxiety and headache.   Pt provided with medications per providers orders. Pt's labs and vitals were monitored throughout the day. Pt supported emotionally and encouraged to express concerns and questions. Pt educated on medications.  Pt's safety ensured with 15 minute and environmental checks. Pt currently denies SI/HI and A/V hallucinations. Pt verbally agrees to seek staff if SI/HI or A/VH occurs and to consult with staff before acting on these thoughts. Will continue POC.

## 2015-12-30 NOTE — Plan of Care (Signed)
Problem: Alteration in mood & ability to function due to Goal: STG-Patient will report withdrawal symptoms Outcome: Progressing Same nurse assigned to this patient today as of yesterday

## 2015-12-30 NOTE — Progress Notes (Signed)
Pt reports that he is doing ok this evening.  He reports that he is having mild to moderate withdrawal symptoms, but he is utilizing the prns available.  Pt did express concern about his roommate, stating that he was loud, slamming the bathroom door and posturing as if he was challenging the patient.  Pt stated he was uncomfortable being in the room with the other patient.  Assured pt that staff would keep a close watch on the situation and address any presumed threatening behaviors.  Pt voiced understanding.  Pt denies SI/HI/AVH.  He makes his needs known to staff.  Support and encouragement offered.  Discharge plans are in process.  Safety maintained with q15 minute checks.

## 2015-12-30 NOTE — Progress Notes (Signed)
[facility] MD Progress Note  12/30/2015 3:28 PM Steven Daniels  MRN:  [id number]  Subjective: Steven Daniels reports, "I'm not feeling very well. I got the tremors, headache pains, stomach hurting. I guess you call it alcohol detox"  Objective: Steven Daniels is seen, chart reviewed. Steven Daniels is lying down in Steven Daniels bed sleeping. Steven Daniels has not been out of Steven Daniels room today. Hoping to be up at around lunch time. Steven Daniels presents with alcohol withdrawal symptoms: dizziness, headaches, stomach cramps & nausea. Steven Daniels says Steven Daniels feeling very depressed since the death of Steven Daniels father during the December 2016 holidays. Steven Daniels says that contributed more to Steven Daniels increased drinking & worsening depression. Even with all these in Steven Daniels mind, Ayson denies any SIHI, AVH. Steven Daniels says Steven Daniels will be able to participate in group activities once Steven Daniels withdrawal symptoms subsides. Steven Daniels appears to be in no apparent distress.  Principal Problem: Severe recurrent major depression without psychotic features (HCC)  Diagnosis:   Steven Daniels Active Problem List   Diagnosis Date Noted  . Alcohol dependence with withdrawal, uncomplicated (HCC) [F10.230] 12/29/2015  . Severe recurrent major depression without psychotic features (HCC) [F33.2] 12/29/2015   Total Time spent with Steven Daniels: 25 minutes  Past Psychiatric History: Alcoholism, depression  Past Medical History:  Past Medical History  Diagnosis Date  . Hypertension   . Coronary artery disease   . Alcohol abuse   . Hard of hearing   . Heart murmur     per pt 12/28/15    Past Surgical History  Procedure Laterality Date  . Hip fracture surgery Right    Family History: History reviewed. No pertinent family history.  Family Psychiatric  History: See H&P  Social History:  History  Alcohol Use  . 51.0 oz/week  . 85 Shots of liquor per week     History  Drug Use No    Social History   Social History  . Marital Status: Single    Spouse Name: N/A  . Number of Children: N/A  . Years of Education: N/A   Social History Main  Topics  . Smoking status: Current Every Day Smoker -- 1.00 packs/day    Types: Cigarettes  . Smokeless tobacco: None  . Alcohol Use: 51.0 oz/week    85 Shots of liquor per week  . Drug Use: No  . Sexual Activity: Yes    Birth Control/ Protection: None   Other Topics Concern  . None   Social History Narrative   Additional Social History:    Pain Medications: denies Prescriptions: denies Over the Counter: denies History of alcohol / drug use?: Yes Longest period of sobriety (when/how long): "about 2 or 3 years between 2000-2003" Negative Consequences of Use: Surveyor, quantity, Work / Programmer, multimedia, Personal relationships Withdrawal Symptoms: Tremors, Irritability  Sleep: Good  Appetite:  Good  Current Medications: Current Facility-Administered Medications  Medication Dose Route Frequency Provider Last Rate Last Dose  . acetaminophen (TYLENOL) tablet 650 mg  650 mg Oral Q6H PRN Worthy Flank, NP   650 mg at 12/29/15 1134  . alum & mag hydroxide-simeth (MAALOX/MYLANTA) 200-200-20 MG/5ML suspension 30 mL  30 mL Oral Q4H PRN Worthy Flank, NP      . busPIRone (BUSPAR) tablet 10 mg  10 mg Oral BID Rachael Fee, MD   10 mg at 12/30/15 0850  . doxycycline (VIBRA-TABS) tablet 100 mg  100 mg Oral Q12H Rachael Fee, MD   100 mg at 12/30/15 0850  . feeding supplement (ENSURE ENLIVE) (ENSURE ENLIVE) liquid 237 mL  237 mL Oral BID BM Rachael Fee, MD   237 mL at 12/29/15 1000  . FLUoxetine (PROZAC) capsule 20 mg  20 mg Oral Daily Rachael Fee, MD   20 mg at 12/30/15 0850  . hydrOXYzine (ATARAX/VISTARIL) tablet 25 mg  25 mg Oral Q6H PRN Worthy Flank, NP   25 mg at 12/29/15 2119  . ibuprofen (ADVIL,MOTRIN) tablet 600 mg  600 mg Oral Q6H PRN Rachael Fee, MD      . loperamide (IMODIUM) capsule 2-4 mg  2-4 mg Oral PRN Worthy Flank, NP      . LORazepam (ATIVAN) tablet 1 mg  1 mg Oral TID Rachael Fee, MD   1 mg at 12/30/15 1306   Followed by  . [START ON 12/31/2015] LORazepam (ATIVAN) tablet 1  mg  1 mg Oral BID Rachael Fee, MD       Followed by  . [START ON 01/01/2016] LORazepam (ATIVAN) tablet 1 mg  1 mg Oral Daily Rachael Fee, MD      . LORazepam (ATIVAN) tablet 1 mg  1 mg Oral Q4H PRN Rachael Fee, MD      . magnesium hydroxide (MILK OF MAGNESIA) suspension 30 mL  30 mL Oral Daily PRN Worthy Flank, NP      . multivitamin with minerals tablet 1 tablet  1 tablet Oral Daily Rachael Fee, MD   1 tablet at 12/30/15 0850  . nicotine (NICODERM CQ - dosed in mg/24 hours) patch 21 mg  21 mg Transdermal Daily Rachael Fee, MD   21 mg at 12/30/15 [phone]  . ondansetron (ZOFRAN-ODT) disintegrating tablet 4 mg  4 mg Oral Q6H PRN Worthy Flank, NP   4 mg at 12/29/15 0650  . propranolol (INDERAL) tablet 10 mg  10 mg Oral TID Sanjuana Kava, NP      . thiamine (B-1) injection 100 mg  100 mg Intramuscular Once Worthy Flank, NP   100 mg at 12/28/15 2158  . thiamine (VITAMIN B-1) tablet 100 mg  100 mg Oral Daily Worthy Flank, NP   100 mg at 12/30/15 9604    Lab Results:  Results for orders placed or performed during the hospital encounter of 12/28/15 (from the past 48 hour(s))  TSH     Status: None   Collection Time: 12/30/15  6:35 AM  Result Value Ref Range   TSH 0.777 0.350 - 4.500 uIU/mL    Comment: Performed at [facility]  Lipid panel     Status: Abnormal   Collection Time: 12/30/15  6:35 AM  Result Value Ref Range   Cholesterol 236 (H) 0 - 200 mg/dL   Triglycerides 540 (H) <150 mg/dL   HDL 80 >98 mg/dL   Total CHOL/HDL Ratio 3.0 RATIO   VLDL 32 0 - 40 mg/dL   LDL Cholesterol 119 (H) 0 - 99 mg/dL    Comment:        Total Cholesterol/HDL:CHD Risk Coronary Heart Disease Risk Table                     Men   Women  1/2 Average Risk   3.4   3.3  Average Risk       5.0   4.4  2 X Average Risk   9.6   7.1  3 X Average Risk  23.4   11.0        Use the calculated  Steven Daniels Ratio above and the CHD Risk Table to determine the Steven Daniels's CHD Risk.        ATP  III CLASSIFICATION (LDL):  <100     mg/dL   Optimal  161-096  mg/dL   Near or Above                    Optimal  130-159  mg/dL   Borderline  045-409  mg/dL   High  >811     mg/dL   Very High Performed at [facility]     Physical Findings: AIMS: Facial and Oral Movements Muscles of Facial Expression: None, normal Lips and Perioral Area: None, normal Jaw: None, normal Tongue: None, normal,Extremity Movements Upper (arms, wrists, hands, fingers): None, normal Lower (legs, knees, ankles, toes): None, normal, Trunk Movements Neck, shoulders, hips: None, normal, Overall Severity Severity of abnormal movements (highest score from questions above): None, normal Incapacitation due to abnormal movements: None, normal Steven Daniels's awareness of abnormal movements (rate only Steven Daniels's report): No Awareness, Dental Status Current problems with teeth and/or dentures?: Yes Does Steven Daniels usually wear dentures?: No  CIWA:  CIWA-Ar Total: 3 COWS:  COWS Total Score: 5  Musculoskeletal: Strength & Muscle Tone: within normal limits Gait & Station: normal Steven Daniels leans: N/A  Psychiatric Specialty Exam: Review of Systems  Constitutional: Positive for chills, malaise/fatigue and diaphoresis.  Eyes: Positive for blurred vision.  Respiratory: Negative.   Cardiovascular: Negative.   Gastrointestinal: Positive for nausea and abdominal pain.  Musculoskeletal: Positive for myalgias.  Skin: Negative.   Neurological: Positive for dizziness, weakness and headaches.  Endo/Heme/Allergies: Negative.   Psychiatric/Behavioral: Positive for depression and substance abuse (Alcoholism, chronic). Negative for suicidal ideas, hallucinations and memory loss. The Steven Daniels is nervous/anxious and has insomnia.     Blood pressure 143/99, pulse 91, temperature 97.8 F (36.6 C), temperature source Oral, resp. rate 20, height  (1.905 m), weight 102.513 kg (226 lb).Body mass index is 28.25 kg/(m^2).  General  Appearance: Fairly Groomed  Patent attorney:: Fair  Speech: Clear and Coherent  Volume: Decreased  Mood: Anxious and Depressed  Affect:Anxious worried  Thought Process: Coherent and Goal Directed  Orientation: Full (Time, Place, and Person)  Thought Content: symptoms events worries concerns  Suicidal Thoughts: No  Homicidal Thoughts: No  Memory: Immediate; Fair Recent; Fair Remote; Fair  Judgement: Fair  Insight: Present and Shallow  Psychomotor Activity: Restlessness, Tremors  Concentration: Fair  Recall: [employer] of Knowledge:Fair  Language: Fair  Akathisia: No  Handed: Right  AIMS (if indicated):    Assets: Desire for Improvement  ADL's: Intact  Cognition: WNL  Sleep: Number of Hours: 6.5         1. Continue crisis management, mood stabilization & relapse prevention.. 2. Continue current medication management to reduce current symptoms to base line and improve the  Steven Daniels's overall level of functioning; Continue Fluoxetine 20 mg for depression, Buspar 10 mg for anxiety, Ativan detox protocols for alcohol detox, initiate propranolol for anxiety/HTN. 3. Treat health problems as indicated; initiated Propranolol 10 mg for HTN. 4. Develop treatment plan to enhance medication adeherance upon discharge and prevent the need for  readmission. 5. Psycho-social education regarding relapse prevention and self care.  Armandina Stammer I, PMHNP, FNP-BC 12/30/2015, 3:28 PM

## 2015-12-30 NOTE — Progress Notes (Signed)
Adult Psychoeducational Group Note  Date:  12/30/2015 Time:  9:30 PM  Group Topic/Focus:  Wrap-Up Group:   The focus of this group is to help patients review their daily goal of treatment and discuss progress on daily workbooks.  Participation Level:  Did Not Attend  Participation Quality:  Did not attend  Affect:  Did not attend  Cognitive:  Did not attend  Insight: None  Engagement in Group:  Did not attend  Modes of Intervention:  Did not attend  Additional Comments:  Patient did not attend wrap up group tonight.   Stefany Starace L Rosevelt Luu 12/30/2015, 9:30 PM

## 2015-12-30 NOTE — [facility] Group Notes (Signed)
[facility] LCSW Group Therapy  12/30/2015 3:32 PM  Type of Therapy:  Group Therapy  Participation Level:  Did Not Attend-pt invited. Chose to remain in bed to sleep.   Modes of Intervention:  Confrontation, Discussion, Education, Exploration, Problem-solving, Rapport Building, Socialization and Support  Summary of Progress/Problems: Feelings Around Diagnosis.   Smart, Devaris Quirk LCSW 12/30/2015, 3:32 PM

## 2015-12-31 LAB — HEMOGLOBIN A1C
Hgb A1c MFr Bld: 6 % — ABNORMAL HIGH (ref 4.8–5.6)
MEAN PLASMA GLUCOSE: 126 mg/dL

## 2015-12-31 MED ORDER — IBUPROFEN 800 MG PO TABS
800.0000 mg | ORAL_TABLET | Freq: Four times a day (QID) | ORAL | Status: DC | PRN
  Administered 2015-12-31 – 2016-01-01 (×2): 800 mg via ORAL
  Filled 2015-12-31 (×2): qty 1

## 2015-12-31 MED ORDER — BUSPIRONE HCL 10 MG PO TABS
10.0000 mg | ORAL_TABLET | Freq: Three times a day (TID) | ORAL | Status: DC
  Administered 2016-01-01 (×2): 10 mg via ORAL
  Filled 2015-12-31 (×7): qty 1

## 2015-12-31 NOTE — Progress Notes (Signed)
[facility] MD Progress Note  12/31/2015 6:22 PM Steven Daniels  MRN:  [id number] Subjective:  Lasaro states that he is starting to feel better. He is worried about his GF who he describes as having issues herself. He feels he needs to be there for her no later than tomorrow. States that once he knows she is OK he can pursue further treatment Principal Problem: Severe recurrent major depression without psychotic features (HCC) Diagnosis:   Patient Active Problem List   Diagnosis Date Noted  . Alcohol dependence with withdrawal, uncomplicated (HCC) [F10.230] 12/29/2015  . Severe recurrent major depression without psychotic features (HCC) [F33.2] 12/29/2015   Total Time spent with patient: 20 min  Past Psychiatric History: see admission H and P  Past Medical History:  Past Medical History  Diagnosis Date  . Hypertension   . Coronary artery disease   . Alcohol abuse   . Hard of hearing   . Heart murmur     per pt 12/28/15    Past Surgical History  Procedure Laterality Date  . Hip fracture surgery Right    Family History: History reviewed. No pertinent family history. Family Psychiatric  History: see admission H and P Social History:  History  Alcohol Use  . 51.0 oz/week  . 85 Shots of liquor per week     History  Drug Use No    Social History   Social History  . Marital Status: Single    Spouse Name: N/A  . Number of Children: N/A  . Years of Education: N/A   Social History Main Topics  . Smoking status: Current Every Day Smoker -- 1.00 packs/day    Types: Cigarettes  . Smokeless tobacco: None  . Alcohol Use: 51.0 oz/week    85 Shots of liquor per week  . Drug Use: No  . Sexual Activity: Yes    Birth Control/ Protection: None   Other Topics Concern  . None   Social History Narrative   Additional Social History:    Pain Medications: denies Prescriptions: denies Over the Counter: denies History of alcohol / drug use?: Yes Longest period of sobriety (when/how long):  "about 2 or 3 years between 2000-2003" Negative Consequences of Use: Surveyor, quantity, Work / Programmer, multimedia, Personal relationships Withdrawal Symptoms: Tremors, Irritability                    Sleep: Fair  Appetite:  Fair  Current Medications: Current Facility-Administered Medications  Medication Dose Route Frequency Provider Last Rate Last Dose  . acetaminophen (TYLENOL) tablet 650 mg  650 mg Oral Q6H PRN Worthy Flank, NP   650 mg at 12/30/15 2005  . alum & mag hydroxide-simeth (MAALOX/MYLANTA) 200-200-20 MG/5ML suspension 30 mL  30 mL Oral Q4H PRN Worthy Flank, NP      . busPIRone (BUSPAR) tablet 10 mg  10 mg Oral BID Rachael Fee, MD   10 mg at 12/31/15 1728  . doxycycline (VIBRA-TABS) tablet 100 mg  100 mg Oral Q12H Rachael Fee, MD   100 mg at 12/31/15 0800  . feeding supplement (ENSURE ENLIVE) (ENSURE ENLIVE) liquid 237 mL  237 mL Oral BID BM Rachael Fee, MD   237 mL at 12/31/15 1500  . FLUoxetine (PROZAC) capsule 20 mg  20 mg Oral Daily Rachael Fee, MD   20 mg at 12/31/15 0800  . hydrOXYzine (ATARAX/VISTARIL) tablet 25 mg  25 mg Oral Q6H PRN Worthy Flank, NP   25 mg at 12/29/15  2119  . ibuprofen (ADVIL,MOTRIN) tablet 800 mg  800 mg Oral Q6H PRN Rachael Fee, MD      . loperamide (IMODIUM) capsule 2-4 mg  2-4 mg Oral PRN Worthy Flank, NP      . Melene Muller ON 01/01/2016] LORazepam (ATIVAN) tablet 1 mg  1 mg Oral Daily Rachael Fee, MD      . LORazepam (ATIVAN) tablet 1 mg  1 mg Oral Q4H PRN Rachael Fee, MD   1 mg at 12/31/15 4098  . magnesium hydroxide (MILK OF MAGNESIA) suspension 30 mL  30 mL Oral Daily PRN Worthy Flank, NP      . multivitamin with minerals tablet 1 tablet  1 tablet Oral Daily Rachael Fee, MD   1 tablet at 12/31/15 0800  . nicotine (NICODERM CQ - dosed in mg/24 hours) patch 21 mg  21 mg Transdermal Daily Rachael Fee, MD   21 mg at 12/31/15 0800  . ondansetron (ZOFRAN-ODT) disintegrating tablet 4 mg  4 mg Oral Q6H PRN Worthy Flank, NP   4 mg at  12/29/15 0650  . propranolol (INDERAL) tablet 10 mg  10 mg Oral TID Sanjuana Kava, NP   10 mg at 12/31/15 1728  . thiamine (B-1) injection 100 mg  100 mg Intramuscular Once Worthy Flank, NP   100 mg at 12/28/15 2158  . thiamine (VITAMIN B-1) tablet 100 mg  100 mg Oral Daily Worthy Flank, NP   100 mg at 12/31/15 0800    Lab Results:  Results for orders placed or performed during the hospital encounter of 12/28/15 (from the past 48 hour(s))  TSH     Status: None   Collection Time: 12/30/15  6:35 AM  Result Value Ref Range   TSH 0.777 0.350 - 4.500 uIU/mL    Comment: Performed at [facility]  Hemoglobin A1c     Status: Abnormal   Collection Time: 12/30/15  6:35 AM  Result Value Ref Range   Hgb A1c MFr Bld 6.0 (H) 4.8 - 5.6 %    Comment: (NOTE)         Pre-diabetes: 5.7 - 6.4         Diabetes: >6.4         Glycemic control for adults with diabetes: <7.0    Mean Plasma Glucose 126 mg/dL    Comment: (NOTE) Performed At: [facility] [address] [place], [place] [id number] Mila Homer MD FA:[id number] Performed at [facility]   Lipid panel     Status: Abnormal   Collection Time: 12/30/15  6:35 AM  Result Value Ref Range   Cholesterol 236 (H) 0 - 200 mg/dL   Triglycerides 696 (H) <150 mg/dL   HDL 80 >29 mg/dL   Total CHOL/HDL Ratio 3.0 RATIO   VLDL 32 0 - 40 mg/dL   LDL Cholesterol 528 (H) 0 - 99 mg/dL    Comment:        Total Cholesterol/HDL:CHD Risk Coronary Heart Disease Risk Table                     Men   Women  1/2 Average Risk   3.4   3.3  Average Risk       5.0   4.4  2 X Average Risk   9.6   7.1  3 X Average Risk  23.4   11.0        Use  the calculated Patient Ratio above and the CHD Risk Table to determine the patient's CHD Risk.        ATP III CLASSIFICATION (LDL):  <100     mg/dL   Optimal  161-096  mg/dL   Near or Above                    Optimal  130-159  mg/dL   Borderline  045-409  mg/dL    High  >811     mg/dL   Very High Performed at [facility]     Physical Findings: AIMS: Facial and Oral Movements Muscles of Facial Expression: None, normal Lips and Perioral Area: None, normal Jaw: None, normal Tongue: None, normal,Extremity Movements Upper (arms, wrists, hands, fingers): None, normal Lower (legs, knees, ankles, toes): None, normal, Trunk Movements Neck, shoulders, hips: None, normal, Overall Severity Severity of abnormal movements (highest score from questions above): None, normal Incapacitation due to abnormal movements: None, normal Patient's awareness of abnormal movements (rate only patient's report): No Awareness, Dental Status Current problems with teeth and/or dentures?: Yes Does patient usually wear dentures?: No  CIWA:  CIWA-Ar Total: 4 COWS:  COWS Total Score: 5  Musculoskeletal: Strength & Muscle Tone: within normal limits Gait & Station: uses cane to stabilize his right side Patient leans: normal  Psychiatric Specialty Exam: Review of Systems  Constitutional: Negative.   Eyes: Negative.   Respiratory: Negative.   Cardiovascular: Negative.   Gastrointestinal: Negative.   Genitourinary: Negative.   Musculoskeletal: Positive for back pain and joint pain.  Skin: Negative.   Neurological: Negative.   Endo/Heme/Allergies: Negative.   Psychiatric/Behavioral: Positive for depression and substance abuse. The patient is nervous/anxious.     Blood pressure 157/83, pulse 76, temperature 99.1 F (37.3 C), temperature source Oral, resp. rate 16, height 6\' 3"  (1.905 m), weight 102.513 kg (226 lb).Body mass index is 28.25 kg/(m^2).  General Appearance: Fairly Groomed  Patent attorney::  Fair  Speech:  Clear and Coherent  Volume:  Normal  Mood:  anxious worried  Affect:  anxious worried  Thought Process:  Coherent and Goal Directed  Orientation:  Full (Time, Place, and Person)  Thought Content:  symptoms events worries concerns  Suicidal Thoughts:   No  Homicidal Thoughts:  No  Memory:  Immediate;   Fair Recent;   Fair Remote;   Fair  Judgement:  Fair  Insight:  Present  Psychomotor Activity:  Normal  Concentration:  Fair  Recall:  [employer] of Knowledge:Fair  Language: Fair  Akathisia:  No  Handed:  Right  AIMS (if indicated):     Assets:  Desire for Improvement  ADL's:  Intact  Cognition: WNL  Sleep:  Number of Hours: 6.75   Treatment Plan Summary: Daily contact with patient to assess and evaluate symptoms and progress in treatment and Medication management Alcohol dependence; complete the Ativan detox protocol/continue to work a relapse prevention plan Depression ; continue to work with the Prozac 20 mg Anxiety; increase the Buspar to 10 mg TID Work with CBT/mindfulness Rachael Fee, MD 12/31/2015, 6:22 PM

## 2015-12-31 NOTE — Progress Notes (Signed)
[facility] Group Notes:  (Nursing/MHT/Case Management/Adjunct)  Date:  12/31/2015  Time:  7:27 PM  Type of Therapy:  Psychoeducational Skills    Summary of Progress/Problems: Pt did not attend personal development group. Pt was encouraged to attend group and did not attend.  Karleen Hampshire Brittini 12/31/2015, 7:27 PM

## 2015-12-31 NOTE — [facility] Group Notes (Signed)
[facility] LCSW Group Therapy  12/31/2015 3:39 PM  Type of Therapy:  Group Therapy  Participation Level:  Did Not Attend  Modes of Intervention:  Confrontation, Discussion, Education, Exploration, Problem-solving, Rapport Building, Socialization and Support  Summary of Progress/Problems: Today's Topic: Overcoming Obstacles. Patients identified one short term goal and potential obstacles in reaching this goal. Patients processed barriers involved in overcoming these obstacles. Patients identified steps necessary for overcoming these obstacles and explored motivation (internal and external) for facing these difficulties head on.    Smart, Marquel Pottenger LCSW 12/31/2015, 3:39 PM
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
## 2015-12-31 NOTE — Plan of Care (Signed)
Problem: Alteration in mood & ability to function due to Goal: LTG-Patient demonstrates decreased signs of withdrawal (Patient demonstrates decreased signs of withdrawal to the point the patient is safe to return home and continue treatment in an outpatient setting)  Outcome: Progressing Pt reports decreased sweating, chills and tremors today. Pt reports that overall he feels better physically than yesterday.

## 2015-12-31 NOTE — Progress Notes (Addendum)
Patient ID: Steven Daniels, male   DOB: [birth year], 55 y.o.   MRN: [id number]  Pt currently presents with an affect appropriate to circumstances and cooperative behavior. Per self inventory, pt rates depression at a 5, hopelessness 4 and anxiety 8. Pt's daily goal is to "get my meds straight, get recovery plan" and they intend to do so by "whatever it takes." Pt reports fair sleep, a good appetite, low energy and poor concentration. Pt affect brighter today. Pt reports "day 3 is always the worst, now I just have to stay clean." Pt hypertensive this morning, will follow up. Pt still exhibiting some signs of withdrawal including tremors, sweats and chills this morning.   Pt provided with medications per providers orders. Pt's labs and vitals were monitored throughout the day. Pt supported emotionally and encouraged to express concerns and questions. Pt educated on medications and potential side effects.   Pt's safety ensured with 15 minute and environmental checks. Pt currently denies SI/HI and A/V hallucinations. Pt verbally agrees to seek staff if SI/HI or A/VH occurs and to consult with staff before acting on these thoughts. Pt writes, "Thank you for your help." Pt states that the thing that would keep him from following his discharge plan would be "losing focus on my duty to myself and my family, being selfish." Will continue POC.

## 2015-12-31 NOTE — [facility] Group Notes (Signed)
[facility] LCSW Aftercare Discharge Planning Group Note   12/31/2015 9:25 AM  Participation Quality:  Invited. DID NOT ATTEND. Pt chose to remain in bed.   Smart, Steven Adcox LCSW

## 2015-12-31 NOTE — Progress Notes (Signed)
Pt did not attend NA meeting this evening. Pt stayed in his bed reading a book.

## 2015-12-31 NOTE — Progress Notes (Signed)
D: Pt was in his room upon initial approach.  He presents with anxious affect and mood.  He reports "I've been detoxing pretty heavy today, I think I'll be better tomorrow."  Pt reports withdrawal symptoms of "shakes, sweats."  Pt denies SI/HI, denies hallucinations.  He reports bilateral lower jaw pain of 8/10.  Pt stayed in his room tonight and did not attend evening group because he wasn't feeling well.    A: Met with pt 1:1 and provided support and encouragement.  PRN medication administered for anxiety and pain.  PO fluids encouraged and provided.  Actively listened to pt.   R: Pt is compliant with medications.  He verbally contracts for safety and reports that he will inform staff of needs and concerns.  Will continue to monitor and assess.

## 2016-01-01 MED ORDER — FLUOXETINE HCL 20 MG PO CAPS: 20.0000 mg | ORAL_CAPSULE | Freq: Every day | ORAL | Status: DC

## 2016-01-01 MED ORDER — BUSPIRONE HCL 10 MG PO TABS: 10.0000 mg | ORAL_TABLET | Freq: Three times a day (TID) | ORAL | Status: DC

## 2016-01-01 MED ORDER — DOXYCYCLINE HYCLATE 100 MG PO TABS: 100.0000 mg | ORAL_TABLET | Freq: Two times a day (BID) | ORAL | Status: DC

## 2016-01-01 MED ORDER — NICOTINE 21 MG/24HR TD PT24: 21.0000 mg | MEDICATED_PATCH | Freq: Every day | TRANSDERMAL | Status: DC

## 2016-01-01 MED ORDER — PROPRANOLOL HCL 10 MG PO TABS: 10.0000 mg | ORAL_TABLET | Freq: Three times a day (TID) | ORAL | Status: DC

## 2016-01-01 NOTE — Progress Notes (Signed)
  [facility] Adult Case Management Discharge Plan :  Will you be returning to the same living situation after discharge:  Yes,  home with gf At discharge, do you have transportation home?: Yes,  bus or gf Do you have the ability to pay for your medications: Yes,  [facility] Medicaid  Release of information consent forms completed and submitted to medical records by CSW.  Patient to Follow up at: Follow-up Information    Follow up with [facility].   Why:  Walk in between 8am-9am Monday through Friday for hospital follow-up, medication management, assessment for therapy services.    Contact information:   [address], [place] [address] Phone: [phone] Fax: [phone]      Next level of care provider has access to [facility] Link:no  Safety Planning and Suicide Prevention discussed: Yes,  SPE completed with pt, as he did not have gf's number. SPI pamphlet provided to pt and he was encouraged to share information with his support network, ask questions, and talk about any concerns relating to SPE.  Have you used any form of tobacco in the last 30 days? (Cigarettes, Smokeless Tobacco, Cigars, and/or Pipes): Yes  Has patient been referred to the Quitline?: Patient refused referral  Patient has been referred for addiction treatment: Pt. refused referral-pt given [facility] and [facility] [employer] information per his request.   Smart, Myliyah Rebuck  LCSW  01/01/2016, 10:37 AM

## 2016-01-01 NOTE — BHH Suicide Risk Assessment (Signed)
[facility] Discharge Suicide Risk Assessment   Principal Problem: Severe recurrent major depression without psychotic features [facility]) Discharge Diagnoses:  Patient Active Problem List   Diagnosis Date Noted  . Alcohol dependence with withdrawal, uncomplicated (HCC) [F10.230] 12/29/2015  . Severe recurrent major depression without psychotic features (HCC) [F33.2] 12/29/2015    Total Time spent with patient: 15 minutes  Musculoskeletal: Strength & Muscle Tone: within normal limits Gait & Station: normaluses cane for support Patient leans: normal  Psychiatric Specialty Exam: Review of Systems  Constitutional: Negative.   HENT: Negative.   Eyes: Negative.   Respiratory: Negative.   Cardiovascular: Negative.   Gastrointestinal: Negative.   Musculoskeletal: Positive for joint pain.  Skin: Negative.   Neurological: Negative.   Endo/Heme/Allergies: Negative.   Psychiatric/Behavioral: Positive for substance abuse.    Blood pressure 133/94, pulse 84, temperature 97.5 F (36.4 C), temperature source Oral, resp. rate 16, height  (1.905 m), weight 102.513 kg (226 lb).Body mass index is 28.25 kg/(m^2).  General Appearance: Fairly Groomed  Patent attorney::  Fair  Speech:  Clear and Coherent409  Volume:  Normal  Mood:  Euthymic  Affect:  Appropriate  Thought Process:  Coherent and Goal Directed  Orientation:  Full (Time, Place, and Person)  Thought Content:  plans as he moves on, relapse prevention plan  Suicidal Thoughts:  No  Homicidal Thoughts:  No  Memory:  Immediate;   Fair Recent;   Fair Remote;   Fair  Judgement:  Fair  Insight:  Present  Psychomotor Activity:  Normal  Concentration:  Fair  Recall:  [employer] of Knowledge:Fair  Language: Fair  Akathisia:  No  Handed:  Right  AIMS (if indicated):     Assets:  Desire for Improvement  Sleep:  Number of Hours: 6.75  Cognition: WNL  ADL's:  Intact  IN full contact with reality. There are no active S/S of withdrawal. There  are no active SI plans or intent. He is planning to go back to the motel. His GF is going to pick him up. He is going to see how she is doing and plan to stay together or encourage her to seek out treatment if she is not "right" never the less he is committed not to drink Mental Status Per Nursing Assessment::   On Admission:  NA  Demographic Factors:  Male and Caucasian  Loss Factors: Decline in physical health and Financial problems/change in socioeconomic status  Historical Factors: none identified  Risk Reduction Factors:   Living with another person, especially a relative  Continued Clinical Symptoms:  Depression:   Comorbid alcohol abuse/dependence Alcohol/Substance Abuse/Dependencies  Cognitive Features That Contribute To Risk:  None    Suicide Risk:  Minimal: No identifiable suicidal ideation.  Patients presenting with no risk factors but with morbid ruminations; may be classified as minimal risk based on the severity of the depressive symptoms  Follow-up Information    Follow up with [facility].   Why:  Walk in between 8am-9am Monday through Friday for hospital follow-up, medication management, assessment for therapy services.    Contact information:   [address], [place] [address] Phone: [phone] Fax: [phone]      Plan Of Care/Follow-up recommendations:  Activity:  as tolerated Diet:  heart healthy  Mick Tanguma A, MD 01/01/2016, 11:50 AM

## 2016-01-01 NOTE — Progress Notes (Signed)
Patient resting in bed much of the morning. States she was poor in anticipation for discharge today. Patient rates his depression at a 5/10, hopelessness at a 4/10 and anxiety at a 7/10. Continues with chronic hip pain of a 7/10. Also reports tooth pain from infection at a 7/10 but indicates it is lessening with antibiotic therapy. Patient medicated per orders and motrin given for pain. Emotional support offered. Fall precautions reviewed with patient and in place. Patient verbalizes understanding. On reassess of pain, patient is asleep. He denies SI/HI/AVH and remains safe. Lawrence Marseilles

## 2016-01-01 NOTE — Progress Notes (Signed)
Patient verbalizes readiness for discharge. Follow up plan explained by discharging nurse A Breedlove RN. Rx's given and all belongings returned. Patient verbalizes understanding. Denies SI/HI and assures staff he will seek assistance should that status change. Patient discharged ambulatory and in stable condition. Steven Daniels

## 2016-01-01 NOTE — BHH Group Notes (Signed)
B[facility] Group Notes:  (Nursing/MHT/Case Management/Adjunct)  Date:  01/01/2016  Time:  0900  Type of Therapy:  Nurse Education  Participation Level:  Did Not Attend  Participation Quality:    Affect:    Cognitive:    Insight:    Engagement in Group:    Modes of Intervention:    Summary of Progress/Problems: patient was invited by staff however elected to remain in bed.  Merian Capron [facility] 01/01/2016, 0930

## 2016-01-01 NOTE — Tx Team (Signed)
Interdisciplinary Treatment Plan Update (Adult)  Date:  01/01/2016  Time Reviewed:  10:38 AM   Progress in Treatment: Attending groups: No.  Participating in groups:  No. Taking medication as prescribed:  Yes. Tolerating medication:  Yes. Family/Significant othe contact made:  SPE completed with pt, as he did not provide phone number(s) for any contacts.  Patient understands diagnosis:  Yes. and As evidenced by:  seeking treatment for passive SI, increased depression/grief, ETOH abuse, hx of polysubstance abuse, and for medication stabilization.  Discussing patient identified problems/goals with staff:  Yes. Medical problems stabilized or resolved:  Yes. Denies suicidal/homicidal ideation: Yes. Issues/concerns per patient self-inventory:  Other:  Discharge Plan or Barriers: Pt to return home; given [facility] information per his request. Pt plans to follow-up at [facility].   Reason for Continuation of Hospitalization: None  Comments:  Wright Gravely is an 55 y.o. male, who reports driving himself to [facility]. Patient presented orientated x4, mood "depressed, and affect congruent with mood. The Veteran expressed vague SI in terms of "if I go to sleep and don't wake up, I will be okay with that." He denied HI and AVH. Patient reports remaining in bed for the past 3 weeks consuming alcohol and not taking care of daily hygiene needs. He reports his Father died in 2017/01/06and that is when his alcohol consumption increased. The Patient reports his Spouse was recently hospitalized for schizophrenia. Patient reports being on SSI for PTSD. He reports mental health diagnoses of PTSD and Bipolar I. Patient reports previously being prescribed Prozac, Neurontin, and Trazodone, however he has not been med compliant for " a long time." Patient reports consuming a 5th of liquor daily starting at wakening, if not he will experience alcohol withdrawal symptoms. Patient reports  snorting and smoking Meth with last use a month ago. 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 He reports participating in substance rehab in 2000. The Patient reports no history of psychiatric hospitalization. Patient reports wanting substance use rehabilitation.Diagnosis: Alcohol Use Disorder, severe, Bipolar I Disorder, current episode depressed, severe  Estimated length of stay:  D/c today  Additional Comments:  Patient and CSW reviewed pt's identified goals and treatment plan. Patient verbalized understanding and agreed to treatment plan. CSW reviewed [facility] "Discharge Process and Patient Involvement" Form. Pt verbalized understanding of information provided and signed form.    Review of initial/current patient goals per problem list:  1. Goal(s): Patient will participate in aftercare plan  Met:Yes   Target date: at discharge  As evidenced by: Patient will participate within aftercare plan AEB aftercare provider and housing plan at discharge being identified.  1/30: CSW assessing for appropriate referrals. Pt did not attend morning d/c planning group.   2/2: Pt plans to return home and will follow-up at [facility]   2. Goal (s): Patient will exhibit decreased depressive symptoms and suicidal ideations.  Met: Yes    Target date: at discharge  As evidenced by: Patient will utilize self rating of depression at 3 or below and demonstrate decreased signs of depression or be deemed stable for discharge by MD.   1/30: Pt rates depression as high. Denies Si/HI/AVH today.    2/2: PT rates depression as 2/10 and presents with pleasant mood/lethargic affect.   3. Goal(s): Patient will demonstrate decreased signs of withdrawal due to substance abuse  Met:Yes.   Target date:at discharge   As evidenced by: Patient will produce a CIWA/COWS score of 0, have stable vitals  signs, and no symptoms of withdrawal.  1/30: Pt reports moderate/severe withdrawals with CIWA  score of 10 and stable vitals.   2/2: Pt reports no signs of withdrawal with CIWA score of 0 and high BP. Per MD, pt is medically stable for discharge today.  Attendees: Patient:   01/01/2016 10:38 AM   Family:   01/01/2016 10:38 AM   Physician:  Dr. Carlton Adam, MD 01/01/2016 10:38 AM   Nursing:   Carlis Stable RN  01/01/2016 10:38 AM   Clinical Social Worker: Maxie Better, LCSW 01/01/2016 10:38 AM   Clinical Social Worker: Erasmo Downer Drinkard LCSWA; Peri Maris LCSWA 01/01/2016 10:38 AM   Other:  Gerline Legacy Nurse Case Manager 01/01/2016 10:38 AM   Other:   01/01/2016 10:38 AM   Other:   01/01/2016 10:38 AM   Other:  01/01/2016 10:38 AM   Other:  01/01/2016 10:38 AM   Other:  01/01/2016 10:38 AM    01/01/2016 10:38 AM    01/01/2016 10:38 AM    01/01/2016 10:38 AM    01/01/2016 10:38 AM    Scribe for Treatment Team:   Maxie Better, LCSW 01/01/2016 10:38 AM

## 2016-01-01 NOTE — Discharge Summary (Signed)
Physician Discharge Summary Note  Patient:  Steven Daniels is an 55 y.o., male MRN:  [id number] DOB:  28-Jan-1961 Patient phone:  [phone] (home)  Patient address:   [address] [place] [address],  Total Time spent with patient: Greater than 30 minutes  Date of Admission:  12/28/2015  Date of Discharge: 01-01-16  Reason for Admission: Alcohol use disorder/worsening symptoms of depression  Principal Problem: Severe recurrent major depression without psychotic features [facility])  Discharge Diagnoses: Patient Active Problem List   Diagnosis Date Noted  . Alcohol dependence with withdrawal, uncomplicated (HCC) [F10.230] 12/29/2015  . Severe recurrent major depression without psychotic features (HCC) [F33.2] 12/29/2015   Past Psychiatric History: Alcoholism, chronic  Past Medical History:  Past Medical History  Diagnosis Date  . Hypertension   . Coronary artery disease   . Alcohol abuse   . Hard of hearing   . Heart murmur     per pt 12/28/15    Past Surgical History  Procedure Laterality Date  . Hip fracture surgery Right    Family History: History reviewed. No pertinent family history.  Family Psychiatric  History: See H&P  Social History:  History  Alcohol Use  . 51.0 oz/week  . 85 Shots of liquor per week     History  Drug Use No    Social History   Social History  . Marital Status: Single    Spouse Name: N/A  . Number of Children: N/A  . Years of Education: N/A   Social History Main Topics  . Smoking status: Current Every Day Smoker -- 1.00 packs/day    Types: Cigarettes  . Smokeless tobacco: None  . Alcohol Use: 51.0 oz/week    85 Shots of liquor per week  . Drug Use: No  . Sexual Activity: Yes    Birth Control/ Protection: None   Other Topics Concern  . None   Social History Narrative   Hospital Course:  55 Y/O male who states he has been going through a lot of pressure lately, lost his mother some time back and the funeral did not go as  mother wanted as his sister was the executor and did not follow her wishes. His faather died during the holidays his sister the executor told him [place] could not [place]. He left with his GF of 3 years. They are staying at a motel. States his GF has more issues he thought she had. States she is very unstable. States that is has been hard 3 years with her. 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 States his life is getting more and more complicated, has has several surgeries in hi hip, has some other medical things going on and he is not taking care of himself. Before he went to treatment in 2000 he was drinking [employer] to half a gallon a day. Got sober got bored started drinking here and there. Drinking wine beer not liquor. He was drinking 2-3 beers a night but as the last weeks his drinking has escalated. Admits to depression feeling overwhelmed. States he was diagnosed with Bipolar Disorder but he does not think he has Bipolar disorder but anxiety and depression.   Steven Daniels was admitted to the hospital with a BAL of 222 per toxicology tests results. He admits having been drinking a lot & it has worsened. He stated he was facing a lot of familial stressors that worsened his depression. He was here for alcohol detox as well mood stabilization treatments. Steven Daniels's detoxification treatment  was achieved using Ativan detox regimen on a tapering dose format. By using Ativan detox regimen, he received a cleaner detoxification treatment without the lingering effects of the long acting Benzodiazepines. He was enrolled in the group counseling sessions, AA/NA meetings being offered & held on this unit. He participated and learned coping skills. He tolerated his treatment regimen without any significant adverse effects and or reactions.  Besides the detoxification treatments, Steven Daniels was also medicated & discharged on; Buspar 10 mg for anxiety, Fluoxetine 20 mg for depression, Propranolol 10 for anxiety/HTN & Nicotine patch  21 mg for smoking cessation. He presented no other significant pre-existing medical issues that required treatments. Steven Daniels has completed detox treatment and his mood is stable. This is evidenced by his reports of improved mood and absence substance withdrawal symptoms. He will resume psychiatric care and routine medication management at the [facility] clinic here in [place], [place]. He was encouraged to join/attend AA/NA meetings being offered and held within his community to achieve & maintain maximum sobriety.   Upon discharge, he adamantly denies any suicidal, homicidal ideations, auditory, visual hallucinations, delusional thoughts, paranoia & or withdrawal symptoms. Steven Daniels left [facility] with all personal belongings in no apparent distress. He received a 7 days worth supply samples of his [facility] discharge medications provided by [facility] pharmacy. Transportation per city girlfriend.  Physical Findings: AIMS: Facial and Oral Movements Muscles of Facial Expression: None, normal Lips and Perioral Area: None, normal Jaw: None, normal Tongue: None, normal,Extremity Movements Upper (arms, wrists, hands, fingers): None, normal Lower (legs, knees, ankles, toes): None, normal, Trunk Movements Neck, shoulders, hips: None, normal, Overall Severity Severity of abnormal movements (highest score from questions above): None, normal Incapacitation due to abnormal movements: None, normal Patient's awareness of abnormal movements (rate only patient's report): No Awareness, Dental Status Current problems with teeth and/or dentures?: Yes Does patient usually wear dentures?: No  CIWA:  CIWA-Ar Total: 0 COWS:  COWS Total Score: 5  Musculoskeletal: Strength & Muscle Tone: within normal limits Gait & Station: normal Patient leans: N/A  Psychiatric Specialty Exam: Review of Systems  Constitutional: Negative.   HENT: Negative.   Eyes: Negative.   Respiratory: Negative.   Cardiovascular: Negative.   Gastrointestinal:  Negative.   Genitourinary: Negative.   Musculoskeletal: Negative.   Skin: Negative.   Neurological: Negative.   Endo/Heme/Allergies: Negative.   Psychiatric/Behavioral: Positive for depression (Stable) and substance abuse (Alcoholism, chronic). Negative for suicidal ideas, hallucinations and memory loss. The patient has insomnia (Stable). The patient is not nervous/anxious.     Blood pressure 133/94, pulse 84, temperature 97.5 F (36.4 C), temperature source Oral, resp. rate 16, height 6\' 3"  (1.905 m), weight 102.513 kg (226 lb).Body mass index is 28.25 kg/(m^2).  See Md's SRA   Have you used any form of tobacco in the last 30 days? (Cigarettes, Smokeless Tobacco, Cigars, and/or Pipes): Yes  Has this patient used any form of tobacco in the last 30 days? (Cigarettes, Smokeless Tobacco, Cigars, and/or Pipes) Yes, Nicotine patch prescription provided.  Metabolic Disorder Labs:  Lab Results  Component Value Date   HGBA1C 6.0* 12/30/2015   MPG 126 12/30/2015   No results found for: PROLACTIN Lab Results  Component Value Date   CHOL 236* 12/30/2015   TRIG 162* 12/30/2015   HDL 80 12/30/2015   CHOLHDL 3.0 12/30/2015   VLDL 32 12/30/2015   LDLCALC 124* 12/30/2015   See Psychiatric Specialty Exam and Suicide Risk Assessment completed by Attending Physician prior to discharge.  Discharge destination:  Home  Is patient on multiple antipsychotic therapies at discharge:  No   Has Patient had three or more failed trials of antipsychotic monotherapy by history:  No  Recommended Plan for Multiple Antipsychotic Therapies: NA    Medication List    TAKE these medications      Indication   busPIRone 10 MG tablet  Commonly known as:  BUSPAR  Take 1 tablet (10 mg total) by mouth 3 (three) times daily. For anxiety   Indication:  Anxiety Disorder     doxycycline 100 MG tablet  Commonly known as:  VIBRA-TABS  Take 1 tablet (100 mg total) by mouth every 12 (twelve) hours. For infection    Indication:  Infection     FLUoxetine 20 MG capsule  Commonly known as:  PROZAC  Take 1 capsule (20 mg total) by mouth daily. For depression   Indication:  Major Depressive Disorder     nicotine 21 mg/24hr patch  Commonly known as:  NICODERM CQ - dosed in mg/24 hours  Place 1 patch (21 mg total) onto the skin daily. For smoking cessation   Indication:  Nicotine Addiction     propranolol 10 MG tablet  Commonly known as:  INDERAL  Take 1 tablet (10 mg total) by mouth 3 (three) times daily. For anxiety/high blood pressure   Indication:  High Blood Pressure, Anxiety       Follow-up Information    Follow up with [facility].   Why:  Walk in between 8am-9am Monday through Friday for hospital follow-up, medication management, assessment for therapy services.    Contact information:   [address] [place] [address] Phone: [phone] Fax: [phone]     Follow-up recommendations: Activity:  As tolerated Diet: As recommended by your primary care doctor. Keep all scheduled follow-up appointments as recommended.   Comments: Take all your medications as prescribed by your mental healthcare provider. Report any adverse effects and or reactions from your medicines to your outpatient provider promptly. Patient is instructed and cautioned to not engage in alcohol and or illegal drug use while on prescription medicines. In the event of worsening symptoms, patient is instructed to call the crisis hotline, 911 and or go to the nearest ED for appropriate evaluation and treatment of symptoms. Follow-up with your primary care provider for your other medical issues, concerns and or health care needs.   Signed: Sanjuana Kava, NP, PMHNP-BC 01/01/2016, 10:38 AM  I personally assessed the patient and formulated the plan Madie Reno A. Dub Mikes, M.D.

## 2016-01-01 NOTE — BHH Suicide Risk Assessment (Signed)
[facility] INPATIENT:  Family/Significant Other Suicide Prevention Education  Suicide Prevention Education:  Patient Refusal for Family/Significant Other Suicide Prevention Education: The patient Steven Daniels has refused to provide written consent for family/significant other to be provided Family/Significant Other Suicide Prevention Education during admission and/or prior to discharge.  Physician notified.  SPE completed with pt, as pt refused to consent to family contact. SPI pamphlet provided to pt and pt was encouraged to share information with support network, ask questions, and talk about any concerns relating to SPE. Pt denies access to guns/firearms and verbalized understanding of information provided. Mobile Crisis information also provided to pt.   Smart, Barabara Motz LCSW 01/01/2016, 10:37 AM

## 2016-01-01 NOTE — Progress Notes (Signed)
D    Pt requested his medications early so he could go to bed early   He has not been compliant with treatment as he chooses not to attend group but is otherwise cooperative  A   Verbal support given   Medications administered and effectiveness monitored    Q 15 min checks R   Pt safe at present

## 2016-03-14 DIAGNOSIS — F3160 Bipolar disorder, current episode mixed, unspecified: Secondary | ICD-10-CM | POA: Insufficient documentation

## 2017-01-20 ENCOUNTER — Encounter (HOSPITAL_COMMUNITY): Payer: Self-pay | Admitting: Emergency Medicine

## 2017-01-20 ENCOUNTER — Emergency Department (HOSPITAL_COMMUNITY)
Admission: EM | Admit: 2017-01-20 | Discharge: 2017-01-20 | Discharge status: Against Medical Advice | Payer: Medicaid Other | Attending: Emergency Medicine | Admitting: Emergency Medicine

## 2017-01-20 DIAGNOSIS — F10120 Alcohol abuse with intoxication, uncomplicated: Secondary | ICD-10-CM | POA: Insufficient documentation

## 2017-01-20 DIAGNOSIS — F17210 Nicotine dependence, cigarettes, uncomplicated: Secondary | ICD-10-CM | POA: Diagnosis not present

## 2017-01-20 DIAGNOSIS — I10 Essential (primary) hypertension: Secondary | ICD-10-CM | POA: Insufficient documentation

## 2017-01-20 DIAGNOSIS — F1010 Alcohol abuse, uncomplicated: Secondary | ICD-10-CM

## 2017-01-20 DIAGNOSIS — F1910 Other psychoactive substance abuse, uncomplicated: Secondary | ICD-10-CM | POA: Diagnosis not present

## 2017-01-20 DIAGNOSIS — I2510 Atherosclerotic heart disease of native coronary artery without angina pectoris: Secondary | ICD-10-CM | POA: Insufficient documentation

## 2017-01-20 NOTE — Discharge Instructions (Signed)
RESOURCE GUIDE ° °Chronic Pain Problems: °Contact [facility]  [phone] °Patients need to be referred by their primary care doctor. ° °Insufficient Money for Medicine: °Contact [employer]:  call [phone] [phone] ° °No Primary Care Doctor: °Call [facility]  [phone] - can help you locate a primary care doctor that  accepts your insurance, provides certain services, etc. °Physician Referral Service- [phone] ° °Agencies that provide inexpensive medical care: °[facility]  [phone] °[facility]  [phone] °[facility]  [phone] °[facility]  [phone] °[facility]  [phone] °[facility]  [phone] ° °Medicaid-accepting [place] Providers: °[facility]- [address] ° [phone], Mon-Fri 9am-7pm, Sat 9am-1pm °[facility]- [address], [address] ° [phone] °[facility]- [address], [address] ° [phone] °[facility]- [address] [place] [address] ° [phone] °[facility]- [address], [address], [phone] ° Only accepts [place] Access Medicaid patients after they have their name  applied to their card ° °Self Pay (no insurance) in [place]: °Sickle Cell Patients - [facility] ° [address], [phone] °[facility]- [address] ° [phone] °      -     [facility] - [address] °      -     [facility]- see information above (Speak to Pam H if you do not have insurance) °      -  [facility] [place]- [address],  [phone] °      -  [facility]- [address] [place] [address], [phone] °      -  Dr Osei-Bonsu-  [address], [address], [place], [phone] °      -  [facility] - [address], [phone] °      -  [facility]- [address] [place] [address], [phone], also [address]  [address],  [phone] °      -     [facility]- [address], [phone], 1st & 3rd Saturday °        every month, 10am-1pm ° -     [facility] °  [address], [place]. °  Phone:  [phone], Fax:  [phone]. Hours of Operation:  9 am - 6 pm, M-F. ° -     [facility] °  [address], [address], [place] °  Phone: [phone], Fax: [phone]. Hours of Operation:  8:30 am - 5:30 pm, M-F. ° ° ° °Dental Assistance °If unable to pay or uninsured, contact:  [facility] [facility] to become qualified for the adult dental clinic. ° °Patients with Medicaid: [facility] °[address], [phone] °[address], [phone] ° °If unable to pay, or uninsured, contact [place] [facility] ([phone] in [place], [phone] in [place]) to become qualified for the adult dental clinic ° °[facility] °[address] °, [place] [address] °[phone] °[email or web address] ° °Other Low-Cost Community Dental Services: °[facility]- [address], [place], [place], [address], [phone], Ext. [phone], 2nd and 4th Thursday of the month at 6:30am.  10 clients each day by appointment, can sometimes see walk-in patients if someone does not show for an appointment. °  [facility]- [address], [place], [place], [address], [phone] °[facility]- [address], [place], [place], [address], [phone] °[facility]- [phone] °[facility]- [phone] °[facility]- [phone]  °

## 2017-01-20 NOTE — ED Provider Notes (Signed)
Emergency Department Provider Note   I have reviewed the triage vital signs and the nursing notes.   HISTORY  Chief Complaint Alcohol Intoxication   HPI Steven Daniels is a 56 y.o. male with PMH of EtOH abuse, CAD, and HTN and sent to the emergency department for evaluation of alcohol abuse. Patient states that he's been clean for several years and over the past several days he's been drinking heavily and last night used IV drugs. He woke up this morning and then in place and called police for help. [facility] and is requesting inpatient detox. His [place] inpatient detox in [place] before and that worked for his alcohol abuse. He states he quit IV drugs and opiates on his own.   At this point the patient denies any pain, falls, fevers, chills. He states that he last drank immediately prior to ED presentation.    Past Medical History:  Diagnosis Date  . Alcohol abuse   . Coronary artery disease   . Hard of hearing   . Heart murmur    per pt 12/28/15  . Hypertension     Patient Active Problem List   Diagnosis Date Noted  . Alcohol dependence with withdrawal, uncomplicated (HCC) 12/29/2015  . Severe recurrent major depression without psychotic features (HCC) 12/29/2015    Past Surgical History:  Procedure Laterality Date  . HIP FRACTURE SURGERY Right   . KNEE SURGERY Right     Current Outpatient Rx  . Order #: [id number] Class: Normal  . Order #: [id number] Class: Normal  . Order #: [id number] Class: Normal  . Order #: [id number] Class: Normal  . Order #: [id number] Class: Normal    Allergies Other; Apple; Cherry; Lisinopril; Penicillins; and Plum pulp  No family history on file.  Social History Social History  Substance Use Topics  . Smoking status: Current Every Day Smoker    Packs/day: 1.00    Types: Cigarettes  . Smokeless tobacco: Never Used  . Alcohol use 51.0 oz/week    85 Shots of liquor per week      Review of Systems  Constitutional: No fever/chills. EtOH abuse.  Eyes: No visual changes. ENT: No sore throat. Cardiovascular: Denies chest pain. Respiratory: Denies shortness of breath. Gastrointestinal: No abdominal pain.  No nausea, no vomiting.  No diarrhea.  No constipation. Genitourinary: Negative for dysuria. Musculoskeletal: Negative for back pain. Skin: Negative for rash. Neurological: Negative for headaches, focal weakness or numbness.  10-point ROS otherwise negative.  ____________________________________________   PHYSICAL EXAM:  VITAL SIGNS: ED Triage Vitals  Enc Vitals Group     BP 01/20/17 1359 131/75     Pulse Rate 01/20/17 1359 81     Resp 01/20/17 1359 18     Temp 01/20/17 1359 98.9 F (37.2 C)     Temp Source 01/20/17 1359 Oral     SpO2 01/20/17 1359 92 %     Weight 01/20/17 1402 220 lb (99.8 kg)     Height 01/20/17 1402 6\' 2"  (1.88 m)     Pain Score 01/20/17 1402 5   Constitutional: Alert and oriented. Well appearing and in no acute distress. Smells of EtOH. Walking with steady gait.  Eyes: Conjunctivae are normal.  Head: Atraumatic. Nose: No congestion/rhinnorhea. Mouth/Throat: Mucous membranes are moist.  Oropharynx non-erythematous. Neck: No stridor.   Cardiovascular: Normal rate, regular rhythm. Good peripheral circulation. Grossly normal heart sounds.   Respiratory: Normal respiratory effort.  No retractions. Lungs CTAB. Gastrointestinal: Soft and nontender.  No distention.  Musculoskeletal: No lower extremity tenderness nor edema. No gross deformities of extremities. Neurologic:  Normal speech and language. No gross focal neurologic deficits are appreciated. Normal gait.  Skin:  Skin is warm, dry and intact. No rash noted.  ____________________________________________   PROCEDURES  Procedure(s) performed:   Procedures  None ____________________________________________   INITIAL IMPRESSION / ASSESSMENT AND PLAN / ED  COURSE  Pertinent labs & imaging results that were available during my care of the patient were reviewed by me and considered in my medical decision making (see chart for details).  Patient presents to the emergency department for evaluation of alcohol and drug detox. The patient has no history of seizure or DTs in the past. He is awake, talking on his phone, ambulatory in the department. I advised that he be monitored for signs of severe withdrawal that would trigger an inpatient detox along with baseline lab work. He stated if he could not be immediately offered inpatient treatment that this was "bullshit" and he was leaving. I encouraged him to stay for monitoring or at least allow me to provide him outpatient resources for alcohol and drug abuse treatment. I instructed the patient that by leaving he was leaving AGAINST MEDICAL ADVICE. Patient walked away with steady gait and using foul language. He appears clinically sober to me and capable of making such a decision. The patient did not drive to the ED. He will be allowed to leave and return with any new or worsening symptoms.    ____________________________________________  FINAL CLINICAL IMPRESSION(S) / ED DIAGNOSES  Final diagnoses:  ETOH abuse  Polysubstance abuse     MEDICATIONS GIVEN DURING THIS VISIT:  None  NEW OUTPATIENT MEDICATIONS STARTED DURING THIS VISIT:  None   Note:  This document was prepared using Dragon voice recognition software and may include unintentional dictation errors.  Alona Bene, MD Emergency Medicine   Maia Plan, MD 01/20/17 [phone]

## 2017-01-20 NOTE — ED Notes (Signed)
After speaking with the EDP, patient ambulated out of the department. Patient breathing normally and appeared to be in no acute distress.

## 2017-01-20 NOTE — ED Triage Notes (Signed)
Per EMS, patient reports drinking alcohol  (binge for the past 3 days) and cocaine use (last night). Patient was recently discharged from rehab. Patient is conscious, alert, oriented. Denies any other complaints. Denies SI/HI. Patient states he feels depressed sometimes but does not have plans of hurting self.

## 2017-01-20 NOTE — ED Notes (Signed)
Bed: [facility] Expected date:  Expected time:  Means of arrival:  Comments: EMS/ETOH 

## 2017-01-20 NOTE — ED Notes (Signed)
ED Provider at bedside. 

## 2017-02-15 ENCOUNTER — Ambulatory Visit (HOSPITAL_COMMUNITY)
Admission: EM | Admit: 2017-02-15 | Discharge: 2017-02-15 | Disposition: A | Discharge status: Home / Self Care | Payer: Medicaid Other | Attending: Family Medicine | Admitting: Family Medicine

## 2017-02-15 ENCOUNTER — Encounter (HOSPITAL_COMMUNITY): Payer: Self-pay | Admitting: Emergency Medicine

## 2017-02-15 DIAGNOSIS — J069 Acute upper respiratory infection, unspecified: Secondary | ICD-10-CM

## 2017-02-15 DIAGNOSIS — J40 Bronchitis, not specified as acute or chronic: Secondary | ICD-10-CM

## 2017-02-15 DIAGNOSIS — B9789 Other viral agents as the cause of diseases classified elsewhere: Secondary | ICD-10-CM

## 2017-02-15 DIAGNOSIS — J4520 Mild intermittent asthma, uncomplicated: Secondary | ICD-10-CM

## 2017-02-15 MED ORDER — METHYLPREDNISOLONE 4 MG PO TBPK: ORAL_TABLET | ORAL | 0 refills | Status: DC

## 2017-02-15 MED ORDER — SODIUM CHLORIDE 0.9 % IN NEBU
INHALATION_SOLUTION | RESPIRATORY_TRACT | Status: AC
  Filled 2017-02-15: qty 3

## 2017-02-15 MED ORDER — AZITHROMYCIN 250 MG PO TABS: 250.0000 mg | ORAL_TABLET | Freq: Every day | ORAL | 0 refills | Status: DC

## 2017-02-15 MED ORDER — ALBUTEROL SULFATE (2.5 MG/3ML) 0.083% IN NEBU
INHALATION_SOLUTION | RESPIRATORY_TRACT | Status: AC
  Filled 2017-02-15: qty 3

## 2017-02-15 MED ORDER — ALBUTEROL SULFATE (2.5 MG/3ML) 0.083% IN NEBU
2.5000 mg | INHALATION_SOLUTION | Freq: Once | RESPIRATORY_TRACT | Status: AC
  Administered 2017-02-15: 2.5 mg via RESPIRATORY_TRACT

## 2017-02-15 MED ORDER — BENZONATATE 100 MG PO CAPS: 100.0000 mg | ORAL_CAPSULE | Freq: Three times a day (TID) | ORAL | 0 refills | Status: DC

## 2017-02-15 MED ORDER — IPRATROPIUM BROMIDE 0.06 % NA SOLN: 2.0000 | Freq: Four times a day (QID) | NASAL | 0 refills | Status: DC

## 2017-02-15 NOTE — ED Provider Notes (Signed)
CSN: [id number]     Arrival date & time 02/15/17  1252 History   First MD Initiated Contact with Patient 02/15/17 1323     Chief Complaint  Patient presents with  . Cough  . Shortness of Breath   (Consider location/radiation/quality/duration/timing/severity/associated sxs/prior Treatment) Patient presents with c/o cough and sob staring today.  He states he has difficulty with persistent cough last night.  He has DOE.   The history is provided by the patient.  Cough  Cough characteristics:  Productive Sputum characteristics:  White Severity:  Moderate Onset quality:  Sudden Duration:  1 day Timing:  Constant Chronicity:  New Smoker: no   Context: upper respiratory infection and weather changes   Relieved by:  Nothing Worsened by:  Nothing Ineffective treatments:  None tried Associated symptoms: shortness of breath   Shortness of Breath  Associated symptoms: cough     Past Medical History:  Diagnosis Date  . Alcohol abuse   . Coronary artery disease   . Hard of hearing   . Heart murmur    per pt 12/28/15  . Hypertension    Past Surgical History:  Procedure Laterality Date  . HIP FRACTURE SURGERY Right   . KNEE SURGERY Right    History reviewed. No pertinent family history. Social History  Substance Use Topics  . Smoking status: Current Every Day Smoker    Packs/day: 1.00    Types: Cigarettes  . Smokeless tobacco: Never Used  . Alcohol use 51.0 oz/week    85 Shots of liquor per week    Review of Systems  Constitutional: Positive for fatigue.  HENT: Positive for sinus pressure and sneezing. Negative for sinus pain.   Eyes: Negative.   Respiratory: Positive for cough and shortness of breath.   Cardiovascular: Negative.   Gastrointestinal: Negative.   Endocrine: Negative.   Genitourinary: Negative.   Musculoskeletal: Negative.   Allergic/Immunologic: Negative.   Neurological: Negative.   Hematological: Negative.   Psychiatric/Behavioral: Negative.      Allergies  Other; Apple; Cherry; Lisinopril; Penicillins; and Plum pulp  Home Medications   Prior to Admission medications   Medication Sig Start Date End Date Taking? Authorizing Provider  DULoxetine (CYMBALTA) 60 MG capsule Take 60 mg by mouth daily.   Yes Historical Provider, MD  propranolol (INDERAL) 10 MG tablet Take 1 tablet (10 mg total) by mouth 3 (three) times daily. For anxiety/high blood pressure 01/01/16  Yes Sanjuana Kava, NP  azithromycin (ZITHROMAX) 250 MG tablet Take 1 tablet (250 mg total) by mouth daily. Take first 2 tablets together, then 1 every day until finished. 02/15/17   Deatra Canter, FNP  benzonatate (TESSALON) 100 MG capsule Take 1 capsule (100 mg total) by mouth every 8 (eight) hours. 02/15/17   Deatra Canter, FNP  ipratropium (ATROVENT) 0.06 % nasal spray Place 2 sprays into both nostrils 4 (four) times daily. 02/15/17   Deatra Canter, FNP  methylPREDNISolone (MEDROL DOSEPAK) 4 MG TBPK tablet Take 6-5-4-3-2-1 po qd 02/15/17   Deatra Canter, FNP   Meds Ordered and Administered this Visit   Medications  albuterol (PROVENTIL) (2.5 MG/3ML) 0.083% nebulizer solution 2.5 mg (2.5 mg Nebulization Given 02/15/17 1337)    BP 125/87 (BP Location: Right Arm)   Pulse 75   Temp 98.4 F (36.9 C) (Oral)   Resp 20   SpO2 97%  No data found.   Physical Exam  Constitutional: He is oriented to person, place, and time. He appears well-developed and well-nourished.  HENT:  Head: Normocephalic and atraumatic.  Right Ear: External ear normal.  Left Ear: External ear normal.  Mouth/Throat: Oropharynx is clear and moist.  Eyes: Conjunctivae and EOM are normal. Pupils are equal, round, and reactive to light.  Neck: Normal range of motion. Neck supple.  Cardiovascular: Normal rate, regular rhythm and normal heart sounds.   Pulmonary/Chest: Effort normal. He has wheezes.  Abdominal: Soft. Bowel sounds are normal.  Musculoskeletal: Normal range of motion.   Neurological: He is alert and oriented to person, place, and time.  Nursing note and vitals reviewed.   Urgent Care Course     Procedures (including critical care time)  Labs Review Labs Reviewed - No data to display  Imaging Review No results found.   Visual Acuity Review  Right Eye Distance:   Left Eye Distance:   Bilateral Distance:    Right Eye Near:   Left Eye Near:    Bilateral Near:         MDM   1. Bronchitis   2. Viral URI with cough   3. Mild intermittent reactive airway disease without complication    Zpak Tessalon Perles Medrol dose pack Atrovent nasal spray  Push po fluids, rest, tylenol and motrin otc prn as directed for fever, arthralgias, and myalgias.  Follow up prn if sx's continue or persist.    Deatra CanterWilliam J Oxford, FNP 02/15/17 [phone]

## 2017-02-15 NOTE — ED Triage Notes (Signed)
[facility] a [facility] of a cough and shortness of breath that started today.

## 2017-02-15 NOTE — Discharge Instructions (Signed)
Use albuterol inhaler from home every 6 hours 2 puffs

## 2017-04-06 ENCOUNTER — Emergency Department (HOSPITAL_COMMUNITY)
Admission: EM | Admit: 2017-04-06 | Discharge: 2017-04-06 | Disposition: A | Discharge status: Home / Self Care | Payer: Medicaid Other | Attending: Emergency Medicine | Admitting: Emergency Medicine

## 2017-04-06 DIAGNOSIS — F1010 Alcohol abuse, uncomplicated: Secondary | ICD-10-CM | POA: Insufficient documentation

## 2017-04-06 DIAGNOSIS — I2510 Atherosclerotic heart disease of native coronary artery without angina pectoris: Secondary | ICD-10-CM | POA: Diagnosis not present

## 2017-04-06 DIAGNOSIS — Z79899 Other long term (current) drug therapy: Secondary | ICD-10-CM | POA: Diagnosis not present

## 2017-04-06 DIAGNOSIS — F332 Major depressive disorder, recurrent severe without psychotic features: Secondary | ICD-10-CM | POA: Diagnosis not present

## 2017-04-06 DIAGNOSIS — I10 Essential (primary) hypertension: Secondary | ICD-10-CM | POA: Insufficient documentation

## 2017-04-06 DIAGNOSIS — F17210 Nicotine dependence, cigarettes, uncomplicated: Secondary | ICD-10-CM | POA: Diagnosis not present

## 2017-04-06 LAB — RAPID URINE DRUG SCREEN, HOSP PERFORMED
AMPHETAMINES: NOT DETECTED
BARBITURATES: NOT DETECTED
Benzodiazepines: NOT DETECTED
COCAINE: POSITIVE — AB
Opiates: NOT DETECTED
TETRAHYDROCANNABINOL: NOT DETECTED

## 2017-04-06 LAB — COMPREHENSIVE METABOLIC PANEL
ALT: 53 U/L (ref 17–63)
ANION GAP: 10 (ref 5–15)
AST: 39 U/L (ref 15–41)
Albumin: 5 g/dL (ref 3.5–5.0)
Alkaline Phosphatase: 77 U/L (ref 38–126)
BUN: 9 mg/dL (ref 6–20)
CO2: 25 mmol/L (ref 22–32)
Calcium: 9.2 mg/dL (ref 8.9–10.3)
Chloride: 105 mmol/L (ref 101–111)
Creatinine, Ser: 0.7 mg/dL (ref 0.61–1.24)
GFR calc Af Amer: >60 mL/min (ref >60)
GFR calc non Af Amer: >60 mL/min (ref >60)
GLUCOSE: 82 mg/dL (ref 65–99)
POTASSIUM: 3.9 mmol/L (ref 3.5–5.1)
SODIUM: 140 mmol/L (ref 135–145)
TOTAL PROTEIN: 8.2 g/dL — AB (ref 6.5–8.1)
Total Bilirubin: 0.6 mg/dL (ref 0.3–1.2)

## 2017-04-06 LAB — LIPASE, BLOOD: Lipase: 30 U/L (ref 11–51)

## 2017-04-06 LAB — CBC WITH DIFFERENTIAL/PLATELET
BASOS PCT: 1 %
Basophils Absolute: 0.1 10*3/uL (ref 0.0–0.1)
Eosinophils Absolute: 0.1 10*3/uL (ref 0.0–0.7)
Eosinophils Relative: 1 %
HEMATOCRIT: 50.1 % (ref 39.0–52.0)
HEMOGLOBIN: 17.6 g/dL — AB (ref 13.0–17.0)
LYMPHS ABS: 1.8 10*3/uL (ref 0.7–4.0)
Lymphocytes Relative: 18 %
MCH: 32.4 pg (ref 26.0–34.0)
MCHC: 35.1 g/dL (ref 30.0–36.0)
MCV: 92.3 fL (ref 78.0–100.0)
Monocytes Absolute: 1.2 10*3/uL — ABNORMAL HIGH (ref 0.1–1.0)
Monocytes Relative: 12 %
NEUTROS ABS: 6.7 10*3/uL (ref 1.7–7.7)
NEUTROS PCT: 68 %
Platelets: 209 10*3/uL (ref 150–400)
RBC: 5.43 MIL/uL (ref 4.22–5.81)
RDW: 12.8 % (ref 11.5–15.5)
WBC: 9.8 10*3/uL (ref 4.0–10.5)

## 2017-04-06 LAB — ETHANOL: Alcohol, Ethyl (B): 7 mg/dL — ABNORMAL HIGH (ref <5)

## 2017-04-06 MED ORDER — SODIUM CHLORIDE 0.9 % IV BOLUS (SEPSIS)
1000.0000 mL | Freq: Once | INTRAVENOUS | Status: AC
  Administered 2017-04-06: 1000 mL via INTRAVENOUS

## 2017-04-06 MED ORDER — ONDANSETRON HCL 4 MG/2ML IJ SOLN
4.0000 mg | Freq: Once | INTRAMUSCULAR | Status: AC
  Administered 2017-04-06: 4 mg via INTRAVENOUS
  Filled 2017-04-06: qty 2

## 2017-04-06 MED ORDER — LORAZEPAM 1 MG PO TABS: 0.0000 mg | ORAL_TABLET | Freq: Two times a day (BID) | ORAL | Status: DC

## 2017-04-06 MED ORDER — LORAZEPAM 1 MG PO TABS
0.0000 mg | ORAL_TABLET | Freq: Four times a day (QID) | ORAL | Status: DC
  Administered 2017-04-06: 1 mg via ORAL
  Filled 2017-04-06: qty 1

## 2017-04-06 NOTE — ED Notes (Signed)
Bed: [facility] Expected date:  Expected time:  Means of arrival:  Comments: 

## 2017-04-06 NOTE — ED Notes (Signed)
ED Provider at bedside. 

## 2017-04-06 NOTE — ED Notes (Signed)
Bed: [facility] Expected date:  Expected time:  Means of arrival:  Comments: 

## 2017-04-06 NOTE — ED Notes (Signed)
Last Drink was 5 A.M. Patient stated he drinks 3 to 4 40oz a day He also smoked crack this A.M.

## 2017-04-06 NOTE — ED Provider Notes (Signed)
[facility]-EMERGENCY DEPT Provider Note   CSN: [id number] Arrival date & time: 04/06/17  1101     History   Chief Complaint Chief Complaint  Patient presents with  . Alcohol Problem    HPI Steven Daniels is a 56 y.o. male.  HPI   56yo male with history of alcohol abuse and dependence, hypertension, depression, who presents with concern for depression and desire for ETOH treatment. Reports he has been "on a bender", drinking 3 40oz a day. Reports history of shakes and anxiety with withdrawal, no hx of seizures.  Has nausea, fatigue, just doesn't feel well. Reports using other drugs. Used cocaine today.  Last drink today 5AM. Reports worsening depression, SI. Went to [facility] and was sent here.  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  Reports the thoughts were getting worse and that is why he sought help.  Reports [facility] but denies suicide attempt in the past.   Past Medical History:  Diagnosis Date  . Alcohol abuse   . Coronary artery disease   . Hard of hearing   . Heart murmur    per pt 12/28/15  . Hypertension     Patient Active Problem List   Diagnosis Date Noted  . Alcohol dependence with withdrawal, uncomplicated (HCC) 12/29/2015  . Severe recurrent major depression without psychotic features (HCC) 12/29/2015    Past Surgical History:  Procedure Laterality Date  . HIP FRACTURE SURGERY Right   . KNEE SURGERY Right        Home Medications    Prior to Admission medications   Medication Sig Start Date End Date Taking? Authorizing Provider  propranolol (INDERAL) 10 MG tablet Take 1 tablet (10 mg total) by mouth 3 (three) times daily. For anxiety/high blood pressure 01/01/16  Yes Nwoko, Agnes I, NP  azithromycin (ZITHROMAX) 250 MG tablet Take 1 tablet (250 mg total) by mouth daily. Take first 2 tablets together, then 1 every day until  finished. Patient not taking: Reported on 04/06/2017 02/15/17   Deatra Canterxford, William J, FNP  benzonatate (TESSALON) 100 MG capsule Take 1 capsule (100 mg total) by mouth every 8 (eight) hours. Patient not taking: Reported on 04/06/2017 02/15/17   Deatra Canterxford, William J, FNP  ipratropium (ATROVENT) 0.06 % nasal spray Place 2 sprays into both nostrils 4 (four) times daily. Patient not taking: Reported on 04/06/2017 02/15/17   Deatra Canterxford, William J, FNP  methylPREDNISolone (MEDROL DOSEPAK) 4 MG TBPK tablet Take 6-5-4-3-2-1 po qd Patient not taking: Reported on 04/06/2017 02/15/17   Deatra Canterxford, William J, FNP    Family History No family history on file.  Social History Social History  Substance Use Topics  . Smoking status: Current Every Day Smoker    Packs/day: 1.00    Types: Cigarettes  . Smokeless tobacco: Never Used  . Alcohol use 51.0 oz/week    85 Shots of liquor per week     Allergies   Other; Apple; Cherry; Lisinopril; Penicillins; and Plum pulp   Review of Systems Review of Systems  Constitutional: Negative for fever.  HENT: Negative for sore throat.   Eyes: Negative for visual disturbance.  Respiratory: Negative for shortness of breath.   Cardiovascular: Negative for chest pain.  Gastrointestinal: Positive for nausea. Negative for abdominal pain.  Genitourinary: Negative for difficulty urinating.  Musculoskeletal: Negative for back pain and neck stiffness.  Skin: Negative for rash.  Neurological:  Negative for syncope and headaches.  Psychiatric/Behavioral: Positive for behavioral problems, dysphoric mood and suicidal ideas. Negative for hallucinations and self-injury.     Physical Exam Updated Vital Signs BP 136/85 (BP Location: Right Arm)   Pulse 80   Temp 98.2 F (36.8 C) (Oral)   Resp 18   Ht 6\' 2"  (1.88 m)   Wt 215 lb (97.5 kg)   SpO2 97%   BMI 27.60 kg/m   Physical Exam  Constitutional: He is oriented to person, place, and time. He appears well-developed and well-nourished. No  distress.  HENT:  Head: Normocephalic and atraumatic.  Eyes: Conjunctivae and EOM are normal.  Neck: Normal range of motion.  Cardiovascular: Normal rate, regular rhythm, normal heart sounds and intact distal pulses.  Exam reveals no gallop and no friction rub.   No murmur heard. Pulmonary/Chest: Effort normal and breath sounds normal. No respiratory distress. He has no wheezes. He has no rales.  Abdominal: Soft. He exhibits no distension. There is no tenderness. There is no guarding.  Musculoskeletal: He exhibits no edema.  Neurological: He is alert and oriented to person, place, and time.  Skin: Skin is warm and dry. He is not diaphoretic.  Psychiatric: He expresses suicidal ideation. He expresses no homicidal ideation. He expresses no suicidal plans and no homicidal plans.  Nursing note and vitals reviewed.    ED Treatments / Results  Labs (all labs ordered are listed, but only abnormal results are displayed) Labs Reviewed  COMPREHENSIVE METABOLIC PANEL - Abnormal; Notable for the following:       Result Value   Total Protein 8.2 (*)    All other components within normal limits  ETHANOL - Abnormal; Notable for the following:    Alcohol, Ethyl (B) 7 (*)    All other components within normal limits  CBC WITH DIFFERENTIAL/PLATELET - Abnormal; Notable for the following:    Hemoglobin 17.6 (*)    Monocytes Absolute 1.2 (*)    All other components within normal limits  RAPID URINE DRUG SCREEN, HOSP PERFORMED - Abnormal; Notable for the following:    Cocaine POSITIVE (*)    All other components within normal limits  LIPASE, BLOOD    EKG  EKG Interpretation None       Radiology No results found.  Procedures Procedures (including critical care time)  Medications Ordered in ED Medications  LORazepam (ATIVAN) tablet 0-4 mg (1 mg Oral Given 04/06/17 1522)    Followed by  LORazepam (ATIVAN) tablet 0-4 mg (not administered)  sodium chloride 0.9 % bolus 1,000 mL (0 mLs  Intravenous Stopped 04/06/17 1613)  ondansetron (ZOFRAN) injection 4 mg (4 mg Intravenous Given 04/06/17 1521)     Initial Impression / Assessment and Plan / ED Course  I have reviewed the triage vital signs and the nursing notes.  Pertinent labs & imaging results that were available during my care of the patient were reviewed by me and considered in my medical decision making (see chart for details).     56yo male with history of alcohol abuse and dependence, hypertension, depression, who presents with concern for depression and desire for ETOH treatment. Reports feeling of anxiety. Tremors noted on exam. Given IV fluids, ativan. Labs WNL. Consulted TTS however prior to evaluation, patient reports he has changed his mind regarding psychiatric evaluation.  He shows appropriate insight into symptoms, appropriately seeking help, no history of suicide attempt, has capacity to make decisions. Discussed possibility to stay but he would like outpatient follow up,  reports he would like to get home to his wife.  He contracts for safety, reports he will seek help again if he has worsening depression and suicidal thoughts and will follow up with [facility].  Patient discharged in stable condition with understanding of reasons to return.   Final Clinical Impressions(s) / ED Diagnoses   Final diagnoses:  Alcohol abuse  Severe episode of recurrent major depressive disorder, without psychotic features [facility])    New Prescriptions Discharge Medication List as of 04/06/2017  5:00 PM       Alvira Monday, MD 04/06/17 2111

## 2017-04-06 NOTE — ED Notes (Signed)
Bed: [facility] Expected date:  Expected time:  Means of arrival:  Comments: 

## 2017-04-06 NOTE — ED Triage Notes (Signed)
Pt here for alcohol detox, felt depressed yesterday, had suicidal ideation at that time with no specific plan. Denies suicidal ideation today, states "I don't want to say anything that will make you keep me." Pt reports drinking 3-4 forty ounce beers per day. Last drink at about 0500 today.

## 2017-04-06 NOTE — ED Notes (Signed)
Pt not in room.

## 2017-08-14 ENCOUNTER — Emergency Department (HOSPITAL_COMMUNITY)
Admission: EM | Admit: 2017-08-14 | Discharge: 2017-08-14 | Disposition: A | Discharge status: Home / Self Care | Payer: Medicaid Other | Attending: Emergency Medicine | Admitting: Emergency Medicine

## 2017-08-14 ENCOUNTER — Encounter (HOSPITAL_COMMUNITY): Payer: Self-pay | Admitting: Emergency Medicine

## 2017-08-14 DIAGNOSIS — I10 Essential (primary) hypertension: Secondary | ICD-10-CM | POA: Diagnosis not present

## 2017-08-14 DIAGNOSIS — K0889 Other specified disorders of teeth and supporting structures: Secondary | ICD-10-CM

## 2017-08-14 DIAGNOSIS — Z79899 Other long term (current) drug therapy: Secondary | ICD-10-CM | POA: Insufficient documentation

## 2017-08-14 DIAGNOSIS — Z23 Encounter for immunization: Secondary | ICD-10-CM | POA: Diagnosis not present

## 2017-08-14 DIAGNOSIS — I2510 Atherosclerotic heart disease of native coronary artery without angina pectoris: Secondary | ICD-10-CM | POA: Diagnosis not present

## 2017-08-14 MED ORDER — TETANUS-DIPHTH-ACELL PERTUSSIS 5-2.5-18.5 LF-MCG/0.5 IM SUSP
0.5000 mL | Freq: Once | INTRAMUSCULAR | Status: AC
  Administered 2017-08-14: 0.5 mL via INTRAMUSCULAR
  Filled 2017-08-14: qty 0.5

## 2017-08-14 MED ORDER — CLINDAMYCIN HCL 150 MG PO CAPS: 450.0000 mg | ORAL_CAPSULE | Freq: Three times a day (TID) | ORAL | 0 refills | Status: AC

## 2017-08-14 NOTE — ED Triage Notes (Signed)
C/o dental abscess with facial swelling x 3 days.

## 2017-08-14 NOTE — ED Notes (Signed)
No answer when called for triage 

## 2017-08-14 NOTE — ED Notes (Signed)
Pt departed in NAD, refused use of wheelchair.  

## 2017-08-14 NOTE — ED Notes (Signed)
Pt came back and apologized stating he "lost my head.. I should not have acted that way....  Just talked to a guy who's family member has been here for 20 days, I have no right to act that way."  Pt "undischarged, PA agreed to see in fast track.  Pt is very hard of hearing.

## 2017-08-14 NOTE — Discharge Instructions (Signed)

## 2017-08-14 NOTE — ED Notes (Signed)
Pt requesting "some of those pharmaceutical rep packs of antibiotics," if possible.

## 2017-08-14 NOTE — ED Notes (Signed)
Pt was seen walking out front door by staff.

## 2017-08-14 NOTE — ED Notes (Signed)
ED Provider at bedside. 

## 2017-08-14 NOTE — ED Notes (Signed)
Pt walking out of triage and states, "don't bother calling me.  I am gone."  Asked pt what was wrong and he states, "if you can't take care of this back here then I am gone."  Explained to pt that this was triage and he would have to go to the back to see provider.  Explained triage process.  He states, "Well, if you call me and I am here then good and if not I still love you."

## 2017-08-15 NOTE — ED Provider Notes (Signed)
[facility] Provider Note   CSN: [id number] Arrival date & time: 08/14/17  2027     History   Chief Complaint Chief Complaint  Patient presents with  . Dental Pain    HPI Steven Daniels is a 56 y.o. male with a history of alcohol abuse, hypertension, heart appearing, who presents for evaluation of 4 days of left-sided dental pain. He reports that 3 days ago he noticed swelling to the left side of his face.  He denies any pain with opening his jaw.  He says that he is unable to afford antibiotics for the next few days, and asks for samples, or if we can give him a "shot of something that will last me a while."  He denies any nausea or vomiting.  He is still able to eat and drink. He does not know when his last tetanus shot was.  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   HPI  Past Medical History:  Diagnosis Date  . Alcohol abuse   . Coronary artery disease   . Hard of hearing   . Heart murmur    per pt 12/28/15  . Hypertension     Patient Active Problem List   Diagnosis Date Noted  . Alcohol dependence with withdrawal, uncomplicated (HCC) 12/29/2015  . Severe recurrent major depression without psychotic features (HCC) 12/29/2015    Past Surgical History:  Procedure Laterality Date  . HIP FRACTURE SURGERY Right   . KNEE SURGERY Right        Home Medications    Prior to Admission medications   Medication Sig Start Date End Date Taking? Authorizing Provider  azithromycin (ZITHROMAX) 250 MG tablet Take 1 tablet (250 mg total) by mouth daily. Take first 2 tablets together, then 1 every day until finished. Patient not taking: Reported on 04/06/2017 02/15/17   Deatra Canter, FNP  benzonatate (TESSALON) 100 MG capsule Take 1 capsule (100 mg total) by mouth every 8 (eight) hours. Patient not taking: Reported on 04/06/2017 02/15/17   Deatra Canter, FNP  clindamycin (CLEOCIN) 150 MG capsule Take 3 capsules (450 mg total) by mouth 3 (three) times daily. 08/14/17  08/21/17  Cristina Gong, PA-C  ipratropium (ATROVENT) 0.06 % nasal spray Place 2 sprays into both nostrils 4 (four) times daily. Patient not taking: Reported on 04/06/2017 02/15/17   Deatra Canter, FNP  methylPREDNISolone (MEDROL DOSEPAK) 4 MG TBPK tablet Take 6-5-4-3-2-1 po qd Patient not taking: Reported on 04/06/2017 02/15/17   Deatra Canter, FNP  propranolol (INDERAL) 10 MG tablet Take 1 tablet (10 mg total) by mouth 3 (three) times daily. For anxiety/high blood pressure 01/01/16   Sanjuana Kava, NP    Family History No family history on file.  Social History Social History  Substance Use Topics  . Smoking status: Current Every Day Smoker    Packs/day: 1.00    Types: Cigarettes  . Smokeless tobacco: Never Used  . Alcohol use 51.0 oz/week    85 Shots of liquor per week     Allergies   Other; Apple; Cherry; Lisinopril; Penicillins; and Plum pulp   Review of Systems Review of Systems  Constitutional: Negative for activity change, appetite change, chills and fever.  HENT: Positive for dental problem and facial swelling. Negative for drooling, sinus pain, sinus pressure, sneezing, trouble swallowing and voice change.   Respiratory: Negative for stridor.   Skin: Negative for color change, rash and wound.     Physical Exam Updated Vital  Signs BP 125/74 (BP Location: Left Arm)   Pulse 88   Temp 98.2 F (36.8 C) (Oral)   Resp 18   SpO2 99%   Physical Exam  Constitutional: He appears well-developed and well-nourished. No distress.  HENT:  Head: Normocephalic and atraumatic.  Right Ear: External ear normal.  Left Ear: External ear normal.  Nose: Nose normal.  Mouth/Throat: Uvula is midline, oropharynx is clear and moist and mucous membranes are normal. No oral lesions. No trismus in the jaw. Abnormal dentition. No dental abscesses, uvula swelling or lacerations. No oropharyngeal exudate. Tonsils are 1+ on the right. Tonsils are 1+ on the left. No tonsillar exudate.   Very minimal swelling to left superior anterior jaw.  He has a left upper molar that is broken off at the gumline. There is no obvious fluctuance or swelling consistent with an abscess.  Swelling does not extend below jaw line.   Neck: Normal range of motion. Neck supple.  Cardiovascular: Normal rate and intact distal pulses.   Pulmonary/Chest: Effort normal. No stridor. No respiratory distress.  Lymphadenopathy:    He has no cervical adenopathy.  Neurological: He is alert.  Skin: He is not diaphoretic.  Nursing note and vitals reviewed.    ED Treatments / Results  Labs (all labs ordered are listed, but only abnormal results are displayed) Labs Reviewed - No data to display  EKG  EKG Interpretation None       Radiology No results found.  Procedures Procedures (including critical care time)  Medications Ordered in ED Medications  Tdap (BOOSTRIX) injection 0.5 mL (0.5 mLs Intramuscular Given 08/14/17 2220)     Initial Impression / Assessment and Plan / ED Course  I have reviewed the triage vital signs and the nursing notes.  Pertinent labs & imaging results that were available during my care of the patient were reviewed by me and considered in my medical decision making (see chart for details).  Clinical Course as of Aug 15 33  Wynelle Link Aug 14, 2017  2142 Patient not in room  [EH]    Clinical Course User Index [EH] Cristina Gong, PA-C   Patient with toothache.  No gross abscess.  Exam unconcerning for Ludwig's angina or spread of infection.  Will treat with clindamycin due to PCN allergy, and instructions on OTC pain medicine.  Urged patient to follow-up with dentist.  I informed him that we do not have any antibiotic samples available for him. I informed him that there is no long-acting injection or one time pill I could give him today to treat his infection.  I provided him with return precautions, the dental resource guide, financial resource, and a good Rx  discount drug coupon.  Tdap Updated.   Final Clinical Impressions(s) / ED Diagnoses   Final diagnoses:  Pain, dental  Toothache    New Prescriptions Discharge Medication List as of 08/14/2017 10:08 PM    START taking these medications   Details  clindamycin (CLEOCIN) 150 MG capsule Take 3 capsules (450 mg total) by mouth 3 (three) times daily., Starting Sun 08/14/2017, Until Sun 08/21/2017, Print         Cristina Gong, PA-C 08/15/17 0037    Tegeler, Canary Brim, MD 08/15/17 1258

## 2017-09-03 ENCOUNTER — Emergency Department (HOSPITAL_COMMUNITY)
Admission: EM | Admit: 2017-09-03 | Discharge: 2017-09-03 | Disposition: A | Discharge status: Home / Self Care | Payer: Medicaid Other | Attending: Emergency Medicine | Admitting: Emergency Medicine

## 2017-09-03 ENCOUNTER — Encounter (HOSPITAL_COMMUNITY): Payer: Self-pay | Admitting: Emergency Medicine

## 2017-09-03 DIAGNOSIS — I2510 Atherosclerotic heart disease of native coronary artery without angina pectoris: Secondary | ICD-10-CM | POA: Diagnosis not present

## 2017-09-03 DIAGNOSIS — H10213 Acute toxic conjunctivitis, bilateral: Secondary | ICD-10-CM | POA: Insufficient documentation

## 2017-09-03 DIAGNOSIS — F17210 Nicotine dependence, cigarettes, uncomplicated: Secondary | ICD-10-CM | POA: Insufficient documentation

## 2017-09-03 DIAGNOSIS — Z79899 Other long term (current) drug therapy: Secondary | ICD-10-CM | POA: Diagnosis not present

## 2017-09-03 DIAGNOSIS — T59891A Toxic effect of other specified gases, fumes and vapors, accidental (unintentional), initial encounter: Secondary | ICD-10-CM | POA: Insufficient documentation

## 2017-09-03 DIAGNOSIS — I10 Essential (primary) hypertension: Secondary | ICD-10-CM | POA: Insufficient documentation
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
## 2017-09-03 NOTE — Discharge Instructions (Signed)
Return to the emergency department if you experience a significant worsening of your symptoms.

## 2017-09-03 NOTE — ED Provider Notes (Signed)
[facility] Provider Note   CSN: [id number] Arrival date & time: 09/03/17  0035     History   Chief Complaint Chief Complaint  Patient presents with  . Eye Pain    HPI Steven Daniels is a 56 y.o. male.  Patient is a 56 year old male with history of hypertension, coronary artery disease, and alcohol abuse. He presents today for evaluation of eye pain. He sprayed air freshener this evening which sprayed backward into his eyes causing burning. He got into the bathtub and irrigated his eyes immediately but continues to have some burning. He has a history of eye surgery performed in 1981 and is concerned that the chemical he sprayed in his eyes may "dissolve the stitches". He denies any visual disturbances. He has been consuming alcohol this evening.   The history is provided by the patient.  Eye Pain  This is a new problem. The current episode started less than 1 hour ago. The problem occurs constantly. The problem has not changed since onset.Nothing aggravates the symptoms. Nothing relieves the symptoms. Treatments tried: Irrigation.    Past Medical History:  Diagnosis Date  . Alcohol abuse   . Coronary artery disease   . Hard of hearing   . Heart murmur    per pt 12/28/15  . Hypertension     Patient Active Problem List   Diagnosis Date Noted  . Alcohol dependence with withdrawal, uncomplicated (HCC) 12/29/2015  . Severe recurrent major depression without psychotic features (HCC) 12/29/2015    Past Surgical History:  Procedure Laterality Date  . HIP FRACTURE SURGERY Right   . KNEE SURGERY Right        Home Medications    Prior to Admission medications   Medication Sig Start Date End Date Taking? Authorizing Provider  azithromycin (ZITHROMAX) 250 MG tablet Take 1 tablet (250 mg total) by mouth daily. Take first 2 tablets together, then 1 every day until finished. Patient not taking: Reported on 04/06/2017 02/15/17   Deatra Canter, FNP  benzonatate  (TESSALON) 100 MG capsule Take 1 capsule (100 mg total) by mouth every 8 (eight) hours. Patient not taking: Reported on 04/06/2017 02/15/17   Deatra Canter, FNP  ipratropium (ATROVENT) 0.06 % nasal spray Place 2 sprays into both nostrils 4 (four) times daily. Patient not taking: Reported on 04/06/2017 02/15/17   Deatra Canter, FNP  methylPREDNISolone (MEDROL DOSEPAK) 4 MG TBPK tablet Take 6-5-4-3-2-1 po qd Patient not taking: Reported on 04/06/2017 02/15/17   Deatra Canter, FNP  propranolol (INDERAL) 10 MG tablet Take 1 tablet (10 mg total) by mouth 3 (three) times daily. For anxiety/high blood pressure 01/01/16   Sanjuana Kava, NP    Family History No family history on file.  Social History Social History  Substance Use Topics  . Smoking status: Current Every Day Smoker    Packs/day: 1.00    Types: Cigarettes  . Smokeless tobacco: Never Used  . Alcohol use 51.0 oz/week    85 Shots of liquor per week     Allergies   Other; Apple; Cherry; Lisinopril; Penicillins; and Plum pulp   Review of Systems Review of Systems  Eyes: Positive for pain.  All other systems reviewed and are negative.    Physical Exam Updated Vital Signs BP 114/71 (BP Location: Right Arm)   Pulse 77   Temp 99.1 F (37.3 C) (Oral)   Resp 20   Wt 97.5 kg (215 lb)   SpO2 97%   BMI 27.60 kg/m  Physical Exam  Constitutional: He is oriented to person, place, and time. He appears well-developed and well-nourished. No distress.  HENT:  Head: Normocephalic and atraumatic.  Eyes:  Conjunctiva are mildly injected bilaterally. Corneas appear clear.  Pulmonary/Chest: Effort normal.  Neurological: He is alert and oriented to person, place, and time.  Skin: He is not diaphoretic.  Nursing note and vitals reviewed.    ED Treatments / Results  Labs (all labs ordered are listed, but only abnormal results are displayed) Labs Reviewed - No data to display  EKG  EKG Interpretation None        Radiology No results found.  Procedures Procedures (including critical care time)  Medications Ordered in ED Medications - No data to display   Initial Impression / Assessment and Plan / ED Course  I have reviewed the triage vital signs and the nursing notes.  Pertinent labs & imaging results that were available during my care of the patient were reviewed by me and considered in my medical decision making (see chart for details).  Spoke with poison control. This is an essential oil and is only an irritant with no caustic materials. Irrigation recommended. Both eyes will be irrigated with saline and he will be discharged.  Final Clinical Impressions(s) / ED Diagnoses   Final diagnoses:  None    New Prescriptions New Prescriptions   No medications on file     Geoffery Lyons, MD 09/03/17 [phone]

## 2017-09-03 NOTE — ED Notes (Signed)
Pt up to BR

## 2017-09-03 NOTE — ED Notes (Signed)
Spoke with EDP about pt wanting to irrigate eyes.  Pt set up with irrigation by the sink and is irrigating his eyes with saline and IV tubing.  Pt opting to do this himself.  Will check on pt periodically.

## 2017-09-05 ENCOUNTER — Encounter (HOSPITAL_COMMUNITY): Payer: Self-pay | Admitting: *Deleted

## 2017-09-05 ENCOUNTER — Emergency Department (HOSPITAL_COMMUNITY)
Admission: EM | Admit: 2017-09-05 | Discharge: 2017-09-05 | Disposition: A | Discharge status: Home / Self Care | Payer: Medicaid Other | Attending: Emergency Medicine | Admitting: Emergency Medicine

## 2017-09-05 ENCOUNTER — Emergency Department (HOSPITAL_COMMUNITY): Payer: Medicaid Other

## 2017-09-05 DIAGNOSIS — W010XXA Fall on same level from slipping, tripping and stumbling without subsequent striking against object, initial encounter: Secondary | ICD-10-CM | POA: Insufficient documentation

## 2017-09-05 DIAGNOSIS — Z79899 Other long term (current) drug therapy: Secondary | ICD-10-CM | POA: Diagnosis not present

## 2017-09-05 DIAGNOSIS — S20211A Contusion of right front wall of thorax, initial encounter: Secondary | ICD-10-CM | POA: Insufficient documentation

## 2017-09-05 DIAGNOSIS — H1132 Conjunctival hemorrhage, left eye: Secondary | ICD-10-CM

## 2017-09-05 DIAGNOSIS — I2510 Atherosclerotic heart disease of native coronary artery without angina pectoris: Secondary | ICD-10-CM | POA: Insufficient documentation

## 2017-09-05 DIAGNOSIS — Y939 Activity, unspecified: Secondary | ICD-10-CM | POA: Insufficient documentation

## 2017-09-05 DIAGNOSIS — Y9289 Other specified places as the place of occurrence of the external cause: Secondary | ICD-10-CM | POA: Diagnosis not present

## 2017-09-05 DIAGNOSIS — S20219A Contusion of unspecified front wall of thorax, initial encounter: Secondary | ICD-10-CM

## 2017-09-05 DIAGNOSIS — F17210 Nicotine dependence, cigarettes, uncomplicated: Secondary | ICD-10-CM | POA: Insufficient documentation

## 2017-09-05 DIAGNOSIS — Y999 Unspecified external cause status: Secondary | ICD-10-CM | POA: Insufficient documentation

## 2017-09-05 DIAGNOSIS — I10 Essential (primary) hypertension: Secondary | ICD-10-CM | POA: Diagnosis not present

## 2017-09-05 DIAGNOSIS — T401X1A Poisoning by heroin, accidental (unintentional), initial encounter: Secondary | ICD-10-CM | POA: Diagnosis present

## 2017-09-05 LAB — CBC WITH DIFFERENTIAL/PLATELET
BASOS PCT: 1 %
Basophils Absolute: 0.1 10*3/uL (ref 0.0–0.1)
EOS ABS: 0.2 10*3/uL (ref 0.0–0.7)
Eosinophils Relative: 2 %
HCT: 47.2 % (ref 39.0–52.0)
Hemoglobin: 15.9 g/dL (ref 13.0–17.0)
Lymphocytes Relative: 13 %
Lymphs Abs: 1.4 10*3/uL (ref 0.7–4.0)
MCH: 32.3 pg (ref 26.0–34.0)
MCHC: 33.7 g/dL (ref 30.0–36.0)
MCV: 95.9 fL (ref 78.0–100.0)
MONO ABS: 1 10*3/uL (ref 0.1–1.0)
MONOS PCT: 9 %
Neutro Abs: 7.6 10*3/uL (ref 1.7–7.7)
Neutrophils Relative %: 75 %
PLATELETS: 232 10*3/uL (ref 150–400)
RBC: 4.92 MIL/uL (ref 4.22–5.81)
RDW: 13.1 % (ref 11.5–15.5)
WBC: 10.2 10*3/uL (ref 4.0–10.5)

## 2017-09-05 LAB — COMPREHENSIVE METABOLIC PANEL
ALBUMIN: 3.6 g/dL (ref 3.5–5.0)
ALT: 33 U/L (ref 17–63)
ANION GAP: 11 (ref 5–15)
AST: 38 U/L (ref 15–41)
Alkaline Phosphatase: 90 U/L (ref 38–126)
BUN: 9 mg/dL (ref 6–20)
CHLORIDE: 106 mmol/L (ref 101–111)
CO2: 21 mmol/L — AB (ref 22–32)
Calcium: 8 mg/dL — ABNORMAL LOW (ref 8.9–10.3)
Creatinine, Ser: 0.68 mg/dL (ref 0.61–1.24)
GFR calc Af Amer: >60 mL/min (ref >60)
GFR calc non Af Amer: >60 mL/min (ref >60)
GLUCOSE: 109 mg/dL — AB (ref 65–99)
Potassium: 3.2 mmol/L — ABNORMAL LOW (ref 3.5–5.1)
SODIUM: 138 mmol/L (ref 135–145)
TOTAL PROTEIN: 6.9 g/dL (ref 6.5–8.1)
Total Bilirubin: 0.7 mg/dL (ref 0.3–1.2)

## 2017-09-05 LAB — RAPID URINE DRUG SCREEN, HOSP PERFORMED
Amphetamines: NOT DETECTED
BARBITURATES: NOT DETECTED
BENZODIAZEPINES: POSITIVE — AB
Cocaine: POSITIVE — AB
Opiates: POSITIVE — AB
TETRAHYDROCANNABINOL: NOT DETECTED

## 2017-09-05 LAB — I-STAT TROPONIN, ED: Troponin i, poc: 0 ng/mL (ref 0.00–0.08)

## 2017-09-05 LAB — ETHANOL: Alcohol, Ethyl (B): 101 mg/dL — ABNORMAL HIGH (ref <10)

## 2017-09-05 MED ORDER — ACETAMINOPHEN 500 MG PO TABS
1000.0000 mg | ORAL_TABLET | Freq: Once | ORAL | Status: AC
  Administered 2017-09-05: 1000 mg via ORAL
  Filled 2017-09-05: qty 2

## 2017-09-05 NOTE — ED Notes (Signed)
Pt calling for a ride. Pt not made aware he can go home if someone comes and picks him up.
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
## 2017-09-05 NOTE — ED Provider Notes (Signed)
Patient's labs are reassuring. Alcohol level of 101 but otherwise no significant findings. On repeat evaluation patient was sleeping but awoke easily. Still complaining of soreness in his chest and was given Tylenol. If patient has a ride feel it's reasonable to allow him to go home. Repeat blood pressure was 110/80. Oxygen saturation 92-94%   Gwyneth Sprout, MD 09/05/17 1106
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
## 2017-09-05 NOTE — ED Notes (Signed)
Pt dropped to 86% while sleeping. Placed on 2L. Pt's oxygen increases to 91% on oxygen

## 2017-09-05 NOTE — ED Notes (Signed)
Pt ambulated to the bathroom. Pt was steady no complaints. Nasal Trumpet removed. Alert to self, place, and situation.

## 2017-11-11 ENCOUNTER — Emergency Department (HOSPITAL_COMMUNITY): Payer: Medicaid Other

## 2017-11-11 ENCOUNTER — Emergency Department (HOSPITAL_COMMUNITY)
Admission: EM | Admit: 2017-11-11 | Discharge: 2017-11-11 | Disposition: A | Discharge status: Home / Self Care | Payer: Medicaid Other | Attending: Emergency Medicine | Admitting: Emergency Medicine

## 2017-11-11 ENCOUNTER — Encounter (HOSPITAL_COMMUNITY): Payer: Self-pay | Admitting: Emergency Medicine

## 2017-11-11 ENCOUNTER — Ambulatory Visit (HOSPITAL_COMMUNITY)
Admission: EM | Admit: 2017-11-11 | Discharge: 2017-11-11 | Disposition: A | Discharge status: Home / Self Care | Payer: Medicaid Other

## 2017-11-11 ENCOUNTER — Other Ambulatory Visit: Payer: Self-pay

## 2017-11-11 DIAGNOSIS — F10230 Alcohol dependence with withdrawal, uncomplicated: Secondary | ICD-10-CM | POA: Insufficient documentation

## 2017-11-11 DIAGNOSIS — F1490 Cocaine use, unspecified, uncomplicated: Secondary | ICD-10-CM

## 2017-11-11 DIAGNOSIS — R0602 Shortness of breath: Secondary | ICD-10-CM | POA: Diagnosis present

## 2017-11-11 DIAGNOSIS — Z79899 Other long term (current) drug therapy: Secondary | ICD-10-CM | POA: Diagnosis not present

## 2017-11-11 DIAGNOSIS — K7030 Alcoholic cirrhosis of liver without ascites: Secondary | ICD-10-CM | POA: Insufficient documentation

## 2017-11-11 DIAGNOSIS — I2510 Atherosclerotic heart disease of native coronary artery without angina pectoris: Secondary | ICD-10-CM | POA: Diagnosis not present

## 2017-11-11 DIAGNOSIS — F17210 Nicotine dependence, cigarettes, uncomplicated: Secondary | ICD-10-CM | POA: Diagnosis not present

## 2017-11-11 DIAGNOSIS — I10 Essential (primary) hypertension: Secondary | ICD-10-CM | POA: Diagnosis not present

## 2017-11-11 DIAGNOSIS — R601 Generalized edema: Secondary | ICD-10-CM | POA: Diagnosis not present

## 2017-11-11 DIAGNOSIS — Z7982 Long term (current) use of aspirin: Secondary | ICD-10-CM | POA: Insufficient documentation

## 2017-11-11 LAB — COMPREHENSIVE METABOLIC PANEL
ALBUMIN: 3 g/dL — AB (ref 3.5–5.0)
ALT: 75 U/L — ABNORMAL HIGH (ref 17–63)
AST: 63 U/L — AB (ref 15–41)
Alkaline Phosphatase: 162 U/L — ABNORMAL HIGH (ref 38–126)
Anion gap: 8 (ref 5–15)
BILIRUBIN TOTAL: 0.7 mg/dL (ref 0.3–1.2)
BUN: 12 mg/dL (ref 6–20)
CO2: 31 mmol/L (ref 22–32)
Calcium: 8.7 mg/dL — ABNORMAL LOW (ref 8.9–10.3)
Chloride: 97 mmol/L — ABNORMAL LOW (ref 101–111)
Creatinine, Ser: 0.81 mg/dL (ref 0.61–1.24)
GFR calc Af Amer: >60 mL/min (ref >60)
GFR calc non Af Amer: >60 mL/min (ref >60)
GLUCOSE: 97 mg/dL (ref 65–99)
POTASSIUM: 4 mmol/L (ref 3.5–5.1)
Sodium: 136 mmol/L (ref 135–145)
TOTAL PROTEIN: 6.5 g/dL (ref 6.5–8.1)

## 2017-11-11 LAB — CBC WITH DIFFERENTIAL/PLATELET
BASOS ABS: 0.1 10*3/uL (ref 0.0–0.1)
BASOS PCT: 1 %
Eosinophils Absolute: 0.4 10*3/uL (ref 0.0–0.7)
Eosinophils Relative: 4 %
HCT: 38.6 % — ABNORMAL LOW (ref 39.0–52.0)
HEMOGLOBIN: 12.2 g/dL — AB (ref 13.0–17.0)
Lymphocytes Relative: 22 %
Lymphs Abs: 1.9 10*3/uL (ref 0.7–4.0)
MCH: 32.4 pg (ref 26.0–34.0)
MCHC: 31.6 g/dL (ref 30.0–36.0)
MCV: 102.7 fL — ABNORMAL HIGH (ref 78.0–100.0)
Monocytes Absolute: 1.5 10*3/uL — ABNORMAL HIGH (ref 0.1–1.0)
Monocytes Relative: 17 %
NEUTROS ABS: 4.7 10*3/uL (ref 1.7–7.7)
NEUTROS PCT: 56 %
Platelets: 219 10*3/uL (ref 150–400)
RBC: 3.76 MIL/uL — ABNORMAL LOW (ref 4.22–5.81)
RDW: 14.7 % (ref 11.5–15.5)
WBC: 8.5 10*3/uL (ref 4.0–10.5)

## 2017-11-11 LAB — BRAIN NATRIURETIC PEPTIDE: B Natriuretic Peptide: 150.4 pg/mL — ABNORMAL HIGH (ref 0.0–100.0)

## 2017-11-11 LAB — PROTIME-INR
INR: 0.9
Prothrombin Time: 12 seconds (ref 11.4–15.2)

## 2017-11-11 MED ORDER — FUROSEMIDE 10 MG/ML IJ SOLN
40.0000 mg | Freq: Once | INTRAMUSCULAR | Status: AC
  Administered 2017-11-11: 40 mg via INTRAVENOUS
  Filled 2017-11-11: qty 4

## 2017-11-11 MED ORDER — LORAZEPAM 1 MG PO TABS: 1.0000 mg | ORAL_TABLET | ORAL | 0 refills | Status: DC

## 2017-11-11 MED ORDER — LORAZEPAM 1 MG PO TABS: 0.0000 mg | ORAL_TABLET | Freq: Four times a day (QID) | ORAL | Status: DC

## 2017-11-11 MED ORDER — FUROSEMIDE 20 MG PO TABS: 20.0000 mg | ORAL_TABLET | Freq: Every day | ORAL | 0 refills | Status: DC

## 2017-11-11 MED ORDER — LORAZEPAM 1 MG PO TABS: 0.0000 mg | ORAL_TABLET | Freq: Two times a day (BID) | ORAL | Status: DC

## 2017-11-11 MED ORDER — LORAZEPAM 2 MG/ML IJ SOLN
0.0000 mg | Freq: Four times a day (QID) | INTRAMUSCULAR | Status: DC
  Administered 2017-11-11: 1 mg via INTRAVENOUS
  Filled 2017-11-11: qty 1

## 2017-11-11 MED ORDER — LORAZEPAM 2 MG/ML IJ SOLN
0.0000 mg | Freq: Two times a day (BID) | INTRAMUSCULAR | Status: DC
Start: 2017-11-13 — End: 2017-11-11

## 2017-11-11 NOTE — ED Triage Notes (Signed)
Swelling in his legs x 2 days ago states  Swelling goes all up leg to  Groin , states his urine stream is not good and he is really sob

## 2017-11-11 NOTE — ED Provider Notes (Signed)
[facility] Provider [facility]   CSN: [id number] Arrival date & time: 11/11/17  1239     History   Chief Complaint Chief Complaint  Patient presents with  . Leg Pain  . Shortness of Breath  . Dysuria    HPI Steven Daniels is a 56 y.o. male.  HPI   56 year old male who presents with bilateral leg swelling and shortness of breath.  He has a history of hypertension, CAD, and chronic alcohol abuse and polysubstance abuse.  Reports 5 days of lower extremity edema, increased abdominal distention, and shortness of breath primarily with activity.  Denies chest pain, orthopnea, PND, fevers, chills, nausea or vomiting, diarrhea or melena/hematochezia.  Endorses frequent alcohol binges.  States that for the past 2 weeks he has been drinking 2 bottles of whiskey and almost 8 x 40s a day.  States that he does every single drug that I can think of aside from IV drug use.  Does report remote history of IV drug use.  Past Medical History:  Diagnosis Date  . Alcohol abuse   . Coronary artery disease   . Hard of hearing   . Heart murmur    per pt 12/28/15  . Hypertension     Patient Active Problem List   Diagnosis Date Noted  . Alcohol dependence with withdrawal, uncomplicated (HCC) 12/29/2015  . Severe recurrent major depression without psychotic features (HCC) 12/29/2015    Past Surgical History:  Procedure Laterality Date  . HIP FRACTURE SURGERY Right   . KNEE SURGERY Right        Home Medications    Prior to Admission medications   Medication Sig Start Date End Date Taking? Authorizing Provider  aspirin EC 81 MG tablet Take 81 mg by mouth daily.   Yes [provider]  clindamycin (CLEOCIN) 150 MG capsule Take by mouth 3 (three) times daily.   Yes [provider]  ferrous sulfate 325 (65 FE) MG tablet Take 325 mg by mouth daily with breakfast.   Yes [provider]  ibuprofen (ADVIL,MOTRIN) 800 MG tablet Take 800  mg by mouth every 8 (eight) hours as needed.   Yes [provider]  Multiple Vitamins-Minerals (MULTIVITAMIN WITH MINERALS) tablet Take 1 tablet by mouth daily.   Yes [provider]  propranolol (INDERAL) 10 MG tablet Take 1 tablet (10 mg total) by mouth 3 (three) times daily. For anxiety/high blood pressure 01/01/16  Yes Nwoko, Agnes I, NP  azithromycin (ZITHROMAX) 250 MG tablet Take 1 tablet (250 mg total) by mouth daily. Take first 2 tablets together, then 1 every day until finished. Patient not taking: Reported on 04/06/2017 02/15/17   Deatra Canterxford, William J, FNP  benzonatate (TESSALON) 100 MG capsule Take 1 capsule (100 mg total) by mouth every 8 (eight) hours. Patient not taking: Reported on 04/06/2017 02/15/17   Deatra Canterxford, William J, FNP  furosemide (LASIX) 20 MG tablet Take 1 tablet (20 mg total) by mouth daily for 7 days. 11/11/17 11/18/17  Lavera GuiseLiu, Kashira Behunin Duo, MD  ipratropium (ATROVENT) 0.06 % nasal spray Place 2 sprays into both nostrils 4 (four) times daily. Patient not taking: Reported on 04/06/2017 02/15/17   Deatra Canterxford, William J, FNP  LORazepam (ATIVAN) 1 MG tablet Take 1 tablet (1 mg total) by mouth See admin instructions. Take 2 mg (2 tablets) by mouth every 4 hours for 6 doses, then take 2 mg (2 tablets) by mouth every  6 hours for 4 doses, then 1 mg (1 tablet)  every 6 hours for 8 additional doses 11/11/17   Lavera GuiseLiu, Nikolos Billig Duo, MD  methylPREDNISolone (MEDROL DOSEPAK) 4 MG TBPK tablet Take 6-5-4-3-2-1 po qd Patient not taking: Reported on 04/06/2017 02/15/17   Deatra Canterxford, William J, FNP    Family History No family history on file.  Social History Social History   Tobacco Use  . Smoking status: Current Every Day Smoker    Packs/day: 1.00    Types: Cigarettes  . Smokeless tobacco: Never Used  Substance Use Topics  . Alcohol use: Yes    Alcohol/week: 51.0 oz    Types: 85 Shots of liquor per week  . Drug use: Yes    Types: Cocaine    Comment: crack, heroin      Allergies   Other; Apple;  Cherry; Lisinopril; Penicillins; and Plum pulp   Review of Systems Review of Systems  Constitutional: Negative for fever.  Respiratory: Positive for cough. Negative for shortness of breath.   Cardiovascular: Positive for leg swelling. Negative for chest pain.  Gastrointestinal: Positive for abdominal distention. Negative for abdominal pain, blood in stool, diarrhea, nausea and vomiting.  All other systems reviewed and are negative.    Physical Exam Updated Vital Signs BP 119/73   Pulse 86   Temp 98.2 F (36.8 C) (Oral)   Resp 16   SpO2 93%   Physical Exam Physical Exam  Nursing note and vitals reviewed. Constitutional: Non-toxic, and in no acute distress Head: Normocephalic and atraumatic.  Mouth/Throat: Oropharynx is clear and moist.  Neck: Normal range of motion. Neck supple.  Cardiovascular: Normal rate and regular rhythm.   Pulmonary/Chest: Effort normal and breath sounds normal.  Abdominal: Soft. There is no tenderness.  Mild distention.  There is no rebound and no guarding.  Musculoskeletal: Normal range of motion. Bilateral lower extremity pitting edema, symmetric. Neurological: Alert, no facial droop, fluent speech, moves all extremities symmetrically Skin: Skin is warm and dry.  Psychiatric: Cooperative   ED Treatments / Results  Labs (all labs ordered are listed, but only abnormal results are displayed) Labs Reviewed  CBC WITH DIFFERENTIAL/PLATELET - Abnormal; Notable for the following components:      Result Value   RBC 3.76 (*)    Hemoglobin 12.2 (*)    HCT 38.6 (*)    MCV 102.7 (*)    Monocytes Absolute 1.5 (*)    All other components within normal limits  COMPREHENSIVE METABOLIC PANEL - Abnormal; Notable for the following components:   Chloride 97 (*)    Calcium 8.7 (*)    Albumin 3.0 (*)    AST 63 (*)    ALT 75 (*)    Alkaline Phosphatase 162 (*)    All other components within normal limits  BRAIN NATRIURETIC PEPTIDE - Abnormal; Notable for the  following components:   B Natriuretic Peptide 150.4 (*)    All other components within normal limits  PROTIME-INR    EKG  EKG Interpretation None       Radiology Dg Chest 2 View  Result Date: 11/11/2017 CLINICAL DATA:  Leg swelling and shortness of breath for 3 days. EXAM: CHEST  2 VIEW COMPARISON:  09/05/2017 FINDINGS: Mildly enlarged cardiac silhouette. Mediastinal contours appear intact. Persistent mild thickening in the right peritracheal soft tissues. Right hilar fullness seen on the lateral view. There is no evidence of pleural effusion or pneumothorax. Mild prominence of the interstitium and fissural thickening. Ground-glass opacity overlying the right lower thorax with uncertain significance. Osseous structures are without acute abnormality. Soft  tissues are grossly normal. IMPRESSION: Mildly enlarged cardiac silhouette with minimal pulmonary vascular congestion. Persistent thickening in the right peritracheal soft tissues, questionable right hilar fullness. Subtle ground-glass opacity in the right lower hemithorax with uncertain significance. Further evaluation with contrast-enhanced CT of the chest may be considered, if found clinically necessary. Electronically Signed   By: Ted Mcalpine M.D.   On: 11/11/2017 13:28    Procedures Procedures (including critical care time)  Medications Ordered in ED Medications  LORazepam (ATIVAN) injection 0-4 mg (1 mg Intravenous Given 11/11/17 1432)    Or  LORazepam (ATIVAN) tablet 0-4 mg ( Oral See Alternative 11/11/17 1432)  LORazepam (ATIVAN) injection 0-4 mg (not administered)    Or  LORazepam (ATIVAN) tablet 0-4 mg (not administered)  furosemide (LASIX) injection 40 mg (40 mg Intravenous Given 11/11/17 1430)     Initial Impression / Assessment and Plan / ED Course  I have reviewed the triage vital signs and the nursing notes.  Pertinent labs & imaging results that were available during my care of the patient were reviewed  by me and considered in my medical decision making (see chart for details).     Presenting with lower extremity edema, increased abdominal girth, and dyspnea on exertion. Patient nontoxic in no acute distress.  Vital signs are non-concerning.  Does look somewhat fluid overloaded with some pitting edema in bilateral lower extremity and increased abdominal girth.  Lungs are clear.  Chest x-ray visualized and does show some mild pulmonary vascular congestion.  Workup reveals normal renal function.  There is evidence of liver dysfunction I suspect that he may have cirrhosis related to his chronic alcohol abuse.  This is likely etiology of his edema.  He did receive a single dose of Lasix here and have significant urine output and feels symptomatically improved.  He is ambulated here in the ED with pulse ox.  He has no hypoxia or desaturations.  He has no dyspnea with exertion or activity, and states that his shortness of breath is resolved.  I discussed alcohol cessation with patient.  His last drink was this morning and has a CIWA score of 5.  Is given Ativan 1 mg while waiting in the ED. Feels comfortable on follow-up.   I have discussed close outpatient follow-up with his primary care doctor.  Gastroenterology referral is also provided.  In the interim he will also be given prescription of Lasix for the short-term.  He will also be given prescription for Ativan as he is very interested in stopping alcohol use.  Strict return instructions are reviewed.  He will return to ED for any symptoms of worsening fluid overload or any complications of alcohol withdrawal. Strict return and follow-up instructions reviewed. He expressed understanding of all discharge instructions and felt comfortable with the plan of care.     Final Clinical Impressions(s) / ED Diagnoses   Final diagnoses:  Alcohol dependence with uncomplicated withdrawal (HCC)  Alcoholic cirrhosis, unspecified whether ascites present [place])     ED Discharge Orders        Ordered    furosemide (LASIX) 20 MG tablet  Daily     11/11/17 1610    LORazepam (ATIVAN) 1 MG tablet  See admin instructions     11/11/17 1610       Lavera Guise, MD 11/11/17 1615

## 2017-11-11 NOTE — Discharge Instructions (Signed)
Your swelling is likely due to alcoholic liver disease/cirrhosis. You need to see a gastroenterology (number listed, please all for appointment) and you need to see your primary care doctor (who will follow-up and coordinate care).  Please stop drinking to cause further damage to your liver. You are given ativan prescription to help you with withdrawal symptoms. Please come to the ED if you have complication of withdrawal, such as seizures, confusion, intractable vomiting.  You are given lasix to help get fluid off. This is a temporizing measure. Please follow-up with your PCP to decide if this is necessary to continue.   Please return without fail for worsening symptoms, including fever, confusion, worsening swelling, difficulty breathing, passing out, bloody stools, or any other symptoms concerning to you.

## 2017-11-11 NOTE — ED Notes (Signed)
Pt ambulated with pulse ox and sats were noted to be 93-95%. Pt denied sob and had smooth and steady gait.

## 2017-11-11 NOTE — ED Triage Notes (Signed)
Pt reports 3 days of bilateral foot and leg swelling.  He also reports SOB.  Pt reports dental surgery yesterday.    He admits to a "four day binder."

## 2017-11-11 NOTE — Discharge Instructions (Signed)
You have a large amount of fluid in your legs and abdomen. I think you will be best served in the ED where you can get a full work up of the underlying cause and medications to help with the fluid overload.

## 2017-11-11 NOTE — ED Provider Notes (Signed)
[facility]    CSN: [id number] Arrival date & time: 11/11/17  1101     History   Chief Complaint Chief Complaint  Patient presents with  . Leg Swelling  . Shortness of Breath    HPI Steven CuriaClay Ditommaso is a 56 y.o. male.    Who carries a history of cocaine and alcohol abuse. He presents with a 3 day history of BLE swelling that has "moved" up into his thigh and abdomen. Last night he began to have mild dyspnea. He admits to a 3 day binge of alcohol and cocaine. He has not sought help at that time. He denies chest pain, N, V. He denies orthopnea, but his abdomen feels "tight". He denies a past history of this problem. He has a long history of a heart murmer. No history of CHF or cardiomyopathy is noted.  He had dental extraction yesterday and is currently on Clindamycin.       Past Medical History:  Diagnosis Date  . Alcohol abuse   . Coronary artery disease   . Hard of hearing   . Heart murmur    per pt 12/28/15  . Hypertension     Patient Active Problem List   Diagnosis Date Noted  . Alcohol dependence with withdrawal, uncomplicated (HCC) 12/29/2015  . Severe recurrent major depression without psychotic features (HCC) 12/29/2015    Past Surgical History:  Procedure Laterality Date  . HIP FRACTURE SURGERY Right   . KNEE SURGERY Right        Home Medications    Prior to Admission medications   Medication Sig Start Date End Date Taking? Authorizing Provider  aspirin EC 81 MG tablet Take 81 mg by mouth daily.   Yes [provider]  clindamycin (CLEOCIN) 150 MG capsule Take by mouth 3 (three) times daily.   Yes [provider]  ferrous sulfate 325 (65 FE) MG tablet Take 325 mg by mouth daily with breakfast.   Yes [provider]  ibuprofen (ADVIL,MOTRIN) 800 MG tablet Take 800 mg by mouth every 8 (eight) hours as needed.   Yes [provider]  Multiple Vitamins-Minerals (MULTIVITAMIN WITH MINERALS) tablet Take 1 tablet  by mouth daily.   Yes [provider]  propranolol (INDERAL) 10 MG tablet Take 1 tablet (10 mg total) by mouth 3 (three) times daily. For anxiety/high blood pressure 01/01/16  Yes Nwoko, Agnes I, NP  azithromycin (ZITHROMAX) 250 MG tablet Take 1 tablet (250 mg total) by mouth daily. Take first 2 tablets together, then 1 every day until finished. Patient not taking: Reported on 04/06/2017 02/15/17   Deatra Canterxford, William J, FNP  benzonatate (TESSALON) 100 MG capsule Take 1 capsule (100 mg total) by mouth every 8 (eight) hours. Patient not taking: Reported on 04/06/2017 02/15/17   Deatra Canterxford, William J, FNP  ipratropium (ATROVENT) 0.06 % nasal spray Place 2 sprays into both nostrils 4 (four) times daily. Patient not taking: Reported on 04/06/2017 02/15/17   Deatra Canterxford, William J, FNP  methylPREDNISolone (MEDROL DOSEPAK) 4 MG TBPK tablet Take 6-5-4-3-2-1 po qd Patient not taking: Reported on 04/06/2017 02/15/17   Deatra Canterxford, William J, FNP    Family History History reviewed. No pertinent family history.  Social History Social History   Tobacco Use  . Smoking status: Current Every Day Smoker    Packs/day: 1.00    Types: Cigarettes  . Smokeless tobacco: Never Used  Substance Use Topics  . Alcohol use: Yes    Alcohol/week: 51.0 oz  Types: 85 Shots of liquor per week  . Drug use: Yes    Types: Cocaine    Comment: crack, heroin      Allergies   Other; Apple; Cherry; Lisinopril; Penicillins; and Plum pulp   Review of Systems Review of Systems  Constitutional: Positive for fatigue. Negative for chills and fever.  Respiratory: Positive for shortness of breath. Negative for apnea, cough, choking and wheezing.   Cardiovascular: Positive for leg swelling. Negative for chest pain and palpitations.  Gastrointestinal: Positive for abdominal distention. Negative for abdominal pain, nausea and vomiting.  Endocrine:       +oliguria is also noted x 2 days  Genitourinary: Positive for decreased urine volume.  Negative for dysuria and urgency.  Musculoskeletal: Negative.   Skin: Negative for rash.  Psychiatric/Behavioral: Negative.      Physical Exam Triage Vital Signs ED Triage Vitals  Enc Vitals Group     BP 11/11/17 1154 136/80     Pulse Rate 11/11/17 1154 83     Resp --      Temp 11/11/17 1154 98.2 F (36.8 C)     Temp Source 11/11/17 1154 Oral     SpO2 11/11/17 1154 97 %     Weight --      Height --      Head Circumference --      Peak Flow --      Pain Score 11/11/17 1147 6     Pain Loc --      Pain Edu? --      Excl. in GC? --    No data found.  Updated Vital Signs BP 136/80 (BP Location: Right Arm)   Pulse 83   Temp 98.2 F (36.8 C) (Oral)   SpO2 97%   Visual Acuity Right Eye Distance:   Left Eye Distance:   Bilateral Distance:    Right Eye Near:   Left Eye Near:    Bilateral Near:     Physical Exam  Constitutional: He is oriented to person, place, and time. He appears well-developed and well-nourished.  Non-toxic appearance. He does not appear ill. No distress.  Cardiovascular: Normal rate and regular rhythm.  Pulmonary/Chest: Effort normal. No accessory muscle usage. No apnea and no bradypnea. No respiratory distress. He has rales in the right lower field and the left lower field.  No acute respiratory compromise, mild rales in bilateral bases  Abdominal: Soft. He exhibits distension. There is no tenderness.  ? Ascites, + hyperresonance  Musculoskeletal:       Right lower leg: He exhibits edema.       Left lower leg: He exhibits edema.  Neurological: He is alert and oriented to person, place, and time.  Skin: Skin is warm.  Psychiatric: He has a normal mood and affect. His behavior is normal.  Nursing note and vitals reviewed.    UC Treatments / Results  Labs (all labs ordered are listed, but only abnormal results are displayed) Labs Reviewed - No data to display  EKG  EKG Interpretation None       Radiology No results  found.  Procedures Procedures (including critical care time)  Medications Ordered in UC Medications - No data to display   Initial Impression / Assessment and Plan / UC Course  I have reviewed the triage vital signs and the nursing notes.  Pertinent labs & imaging results that were available during my care of the patient were reviewed by me and considered in my medical decision making (see  chart for details).   Chronic alcohol and cocaine abuser with anasarca +/- ascitics and new onset dyspnea. Vitals stable to transport himself to ED, but advised to report to ED to rule out cardiac or renal etiology of anasarca. Limited resources to do that here in urgent care and he understands the need to go to the ED.  Final Clinical Impressions(s) / UC Diagnoses   Final diagnoses:  Anasarca  Cocaine use    ED Discharge Orders    None       Controlled Substance Prescriptions [place] Controlled Substance Registry consulted? Not Applicable   Riki Sheer, PA-C 11/11/17 1231

## 2017-11-17 ENCOUNTER — Other Ambulatory Visit: Payer: Self-pay

## 2017-11-17 ENCOUNTER — Encounter (HOSPITAL_COMMUNITY): Payer: Self-pay | Admitting: *Deleted

## 2017-11-17 ENCOUNTER — Emergency Department (HOSPITAL_COMMUNITY)
Admission: EM | Admit: 2017-11-17 | Discharge: 2017-11-17 | Disposition: A | Discharge status: Home / Self Care | Payer: Medicaid Other | Attending: Emergency Medicine | Admitting: Emergency Medicine

## 2017-11-17 ENCOUNTER — Emergency Department (HOSPITAL_COMMUNITY): Payer: Medicaid Other

## 2017-11-17 DIAGNOSIS — F1410 Cocaine abuse, uncomplicated: Secondary | ICD-10-CM | POA: Insufficient documentation

## 2017-11-17 DIAGNOSIS — I10 Essential (primary) hypertension: Secondary | ICD-10-CM | POA: Insufficient documentation

## 2017-11-17 DIAGNOSIS — Z79899 Other long term (current) drug therapy: Secondary | ICD-10-CM | POA: Diagnosis not present

## 2017-11-17 DIAGNOSIS — R2243 Localized swelling, mass and lump, lower limb, bilateral: Secondary | ICD-10-CM | POA: Diagnosis present

## 2017-11-17 DIAGNOSIS — F17210 Nicotine dependence, cigarettes, uncomplicated: Secondary | ICD-10-CM | POA: Diagnosis not present

## 2017-11-17 DIAGNOSIS — R609 Edema, unspecified: Secondary | ICD-10-CM | POA: Insufficient documentation

## 2017-11-17 DIAGNOSIS — Z7982 Long term (current) use of aspirin: Secondary | ICD-10-CM | POA: Diagnosis not present

## 2017-11-17 DIAGNOSIS — I2510 Atherosclerotic heart disease of native coronary artery without angina pectoris: Secondary | ICD-10-CM | POA: Insufficient documentation

## 2017-11-17 LAB — CBC
HEMATOCRIT: 40 % (ref 39.0–52.0)
HEMOGLOBIN: 12.5 g/dL — AB (ref 13.0–17.0)
MCH: 32.3 pg (ref 26.0–34.0)
MCHC: 31.3 g/dL (ref 30.0–36.0)
MCV: 103.4 fL — AB (ref 78.0–100.0)
Platelets: 306 10*3/uL (ref 150–400)
RBC: 3.87 MIL/uL — AB (ref 4.22–5.81)
RDW: 14.8 % (ref 11.5–15.5)
WBC: 9.8 10*3/uL (ref 4.0–10.5)

## 2017-11-17 LAB — COMPREHENSIVE METABOLIC PANEL
ALBUMIN: 3.4 g/dL — AB (ref 3.5–5.0)
ALK PHOS: 129 U/L — AB (ref 38–126)
ALT: 37 U/L (ref 17–63)
AST: 31 U/L (ref 15–41)
Anion gap: 6 (ref 5–15)
BUN: 14 mg/dL (ref 6–20)
CALCIUM: 9 mg/dL (ref 8.9–10.3)
CO2: 32 mmol/L (ref 22–32)
CREATININE: 0.69 mg/dL (ref 0.61–1.24)
Chloride: 97 mmol/L — ABNORMAL LOW (ref 101–111)
GFR calc Af Amer: >60 mL/min (ref >60)
GFR calc non Af Amer: >60 mL/min (ref >60)
GLUCOSE: 113 mg/dL — AB (ref 65–99)
Potassium: 4 mmol/L (ref 3.5–5.1)
SODIUM: 135 mmol/L (ref 135–145)
Total Bilirubin: 0.5 mg/dL (ref 0.3–1.2)
Total Protein: 7 g/dL (ref 6.5–8.1)

## 2017-11-17 LAB — I-STAT TROPONIN, ED: Troponin i, poc: 0 ng/mL (ref 0.00–0.08)

## 2017-11-17 LAB — LIPASE, BLOOD: Lipase: 31 U/L (ref 11–51)

## 2017-11-17 LAB — AMMONIA: Ammonia: 37 umol/L — ABNORMAL HIGH (ref 9–35)

## 2017-11-17 MED ORDER — FUROSEMIDE 40 MG PO TABS: 40.0000 mg | ORAL_TABLET | Freq: Every day | ORAL | 0 refills | Status: DC

## 2017-11-17 MED ORDER — FUROSEMIDE 20 MG PO TABS
40.0000 mg | ORAL_TABLET | Freq: Once | ORAL | Status: AC
  Administered 2017-11-17: 40 mg via ORAL
  Filled 2017-11-17: qty 2

## 2017-11-17 NOTE — Discharge Instructions (Signed)
You must stop alcohol and street drugs.  I am increasing your Lasix to 40 mg daily.  Make sure you see the doctor next Wednesday.

## 2017-11-17 NOTE — ED Triage Notes (Signed)
Pt reports having liver disease, has increase in swelling to legs and abd causing sob.

## 2017-11-17 NOTE — ED Provider Notes (Signed)
[facility] Provider Note   CSN: [id number] Arrival date & time: 11/17/17  1552     History   Chief Complaint Chief Complaint  Patient presents with  . Leg Swelling  . Shortness of Breath    HPI Steven Daniels is a 56 y.o. male.  Patient reports increased swelling in his legs extending into his abdomen.  He has a history of alcohol abuse, heroin and cocaine snorting.  Past medical history includes coronary artery disease, hypertension.  No substernal chest pain, excessive dyspnea, fever, sweats, chills, cough.  Severity of symptoms is moderate.  Nothing makes symptoms better or worse.      Past Medical History:  Diagnosis Date  . Alcohol abuse   . Coronary artery disease   . Hard of hearing   . Heart murmur    per pt 12/28/15  . Hypertension     Patient Active Problem List   Diagnosis Date Noted  . Alcohol dependence with withdrawal, uncomplicated (HCC) 12/29/2015  . Severe recurrent major depression without psychotic features (HCC) 12/29/2015    Past Surgical History:  Procedure Laterality Date  . HIP FRACTURE SURGERY Right   . KNEE SURGERY Right        Home Medications    Prior to Admission medications   Medication Sig Start Date End Date Taking? Authorizing Provider  aspirin EC 81 MG tablet Take 81 mg by mouth daily.   Yes [provider]  clindamycin (CLEOCIN) 150 MG capsule Take by mouth 3 (three) times daily.   Yes [provider]  ferrous sulfate 325 (65 FE) MG tablet Take 325 mg by mouth daily with breakfast.   Yes [provider]  ibuprofen (ADVIL,MOTRIN) 800 MG tablet Take 800 mg by mouth every 8 (eight) hours as needed.   Yes [provider]  LORazepam (ATIVAN) 1 MG tablet Take 1 tablet (1 mg total) by mouth See admin instructions. Take 2 mg (2 tablets) by mouth every 4 hours for 6 doses, then take 2 mg (2 tablets) by mouth every  6 hours for 4 doses, then 1 mg (1 tablet)  every 6 hours for 8 additional doses 11/11/17  Yes Lavera GuiseLiu, Dana Duo, MD  Multiple Vitamins-Minerals (MULTIVITAMIN WITH MINERALS) tablet Take 1 tablet by mouth daily.   Yes [provider]  propranolol ER (INDERAL LA) 80 MG 24 hr capsule Take 80 mg by mouth daily. 12/03/16  Yes [provider]  azithromycin (ZITHROMAX) 250 MG tablet Take 1 tablet (250 mg total) by mouth daily. Take first 2 tablets together, then 1 every day until finished. Patient not taking: Reported on 04/06/2017 02/15/17   Deatra Canterxford, William J, FNP  benzonatate (TESSALON) 100 MG capsule Take 1 capsule (100 mg total) by mouth every 8 (eight) hours. Patient not taking: Reported on 04/06/2017 02/15/17   Deatra Canterxford, William J, FNP  furosemide (LASIX) 40 MG tablet Take 1 tablet (40 mg total) by mouth daily. 11/17/17   Donnetta Hutchingook, Briele Lagasse, MD  ipratropium (ATROVENT) 0.06 % nasal spray Place 2 sprays into both nostrils 4 (four) times daily. Patient not taking: Reported on 04/06/2017 02/15/17   Deatra Canterxford, William J, FNP  methylPREDNISolone (MEDROL DOSEPAK) 4 MG TBPK tablet Take 6-5-4-3-2-1 po qd Patient not taking: Reported on 04/06/2017 02/15/17   Deatra Canterxford, William J, FNP  propranolol (INDERAL) 10 MG tablet Take 1 tablet (10 mg total) by mouth 3 (three) times daily. For anxiety/high blood pressure Patient not taking: Reported on 11/17/2017 01/01/16   Nwoko,  Nelda Marseille, NP    Family History History reviewed. No pertinent family history.  Social History Social History   Tobacco Use  . Smoking status: Current Every Day Smoker    Packs/day: 1.00    Types: Cigarettes  . Smokeless tobacco: Never Used  Substance Use Topics  . Alcohol use: Yes    Alcohol/week: 51.0 oz    Types: 85 Shots of liquor per week  . Drug use: Yes    Types: Cocaine    Comment: crack, heroin      Allergies   Other; Apple; Cherry; Lisinopril; Penicillins; and Plum pulp   Review of Systems Review of Systems  All other systems reviewed and are negative.    Physical  Exam Updated Vital Signs BP (!) 150/91   Pulse (!) 59   Temp 98.1 F (36.7 C) (Oral)   Resp 18   SpO2 97%   Physical Exam  Constitutional: He is oriented to person, place, and time. He appears well-developed and well-nourished.  HENT:  Head: Normocephalic and atraumatic.  Eyes: Conjunctivae are normal.  Neck: Neck supple.  Cardiovascular: Normal rate and regular rhythm.  Pulmonary/Chest: Effort normal and breath sounds normal.  Abdominal: Soft. Bowel sounds are normal.  Musculoskeletal: Normal range of motion.  Neurological: He is alert and oriented to person, place, and time.  Skin: Skin is warm and dry.  Tight peripheral edema.  Psychiatric: He has a normal mood and affect. His behavior is normal.  Nursing note and vitals reviewed.    ED Treatments / Results  Labs (all labs ordered are listed, but only abnormal results are displayed) Labs Reviewed  CBC - Abnormal; Notable for the following components:      Result Value   RBC 3.87 (*)    Hemoglobin 12.5 (*)    MCV 103.4 (*)    All other components within normal limits  COMPREHENSIVE METABOLIC PANEL - Abnormal; Notable for the following components:   Chloride 97 (*)    Glucose, Bld 113 (*)    Albumin 3.4 (*)    Alkaline Phosphatase 129 (*)    All other components within normal limits  AMMONIA - Abnormal; Notable for the following components:   Ammonia 37 (*)    All other components within normal limits  LIPASE, BLOOD  I-STAT TROPONIN, ED    EKG  EKG Interpretation  Date/Time:  Thursday November 17 2017 16:18:04 EST Ventricular Rate:  71 PR Interval:  154 QRS Duration: 114 QT Interval:  426 QTC Calculation: 462 R Axis:   42 Text Interpretation:  Normal sinus rhythm Incomplete right bundle branch block Borderline ECG T waves improved since earlier in day Confirmed by Marily Memos [id number]) on 11/17/2017 4:24:58 PM       Radiology Dg Chest 2 View  Result Date: 11/17/2017 CLINICAL DATA:  Shortness of  breath with decreased liver function. EXAM: CHEST  2 VIEW COMPARISON:  11/11/2017 FINDINGS: Lungs are adequately inflated without focal airspace consolidation or effusion. There is mild prominence of the central perihilar markings suggesting mild vascular congestion. Cardiomediastinal silhouette is within normal. There are mild degenerate changes of the spine. IMPRESSION: Findings suggesting mild vascular congestion. Electronically Signed   By: Elberta Fortis M.D.   On: 11/17/2017 16:40    Procedures Procedures (including critical care time)  Medications Ordered in ED Medications  furosemide (LASIX) tablet 40 mg (40 mg Oral Given 11/17/17 1946)     Initial Impression / Assessment and Plan / ED Course  I have reviewed  the triage vital signs and the nursing notes.  Pertinent labs & imaging results that were available during my care of the patient were reviewed by me and considered in my medical decision making (see chart for details).    Patient has notable tight edema in his periphery.  He states the sensation of fullness goes to the abdomen.  He is not visibly dyspneic.  Lasix 20 mg daily was started 1 week ago.  Screening tests tonight including labs, EKG, chest x-ray show no acute findings.  Will increase Lasix to 40 mg daily.  He has a appointment with Dr. Leone PayorGessner on December 26.  He has been strongly encouraged to stop drinking alcohol and using street drugs  Final Clinical Impressions(s) / ED Diagnoses   Final diagnoses:  Peripheral edema    ED Discharge Orders        Ordered    furosemide (LASIX) 40 MG tablet  Daily     11/17/17 2232       Donnetta Hutchingook, Stetson Pelaez, MD 11/17/17 2239

## 2017-11-17 NOTE — ED Notes (Signed)
Pt states he has filled 1 full urinal since arrival

## 2017-11-23 ENCOUNTER — Encounter: Payer: Self-pay | Admitting: Internal Medicine

## 2017-11-23 ENCOUNTER — Other Ambulatory Visit: Payer: Self-pay

## 2017-11-23 ENCOUNTER — Other Ambulatory Visit (INDEPENDENT_AMBULATORY_CARE_PROVIDER_SITE_OTHER): Payer: Medicaid Other

## 2017-11-23 ENCOUNTER — Ambulatory Visit: Payer: Medicaid Other | Admitting: Internal Medicine

## 2017-11-23 VITALS — BP 104/64 | HR 86 | Ht 74.0 in | Wt 221.0 lb

## 2017-11-23 DIAGNOSIS — F1910 Other psychoactive substance abuse, uncomplicated: Secondary | ICD-10-CM

## 2017-11-23 DIAGNOSIS — R609 Edema, unspecified: Secondary | ICD-10-CM

## 2017-11-23 DIAGNOSIS — R7989 Other specified abnormal findings of blood chemistry: Secondary | ICD-10-CM

## 2017-11-23 DIAGNOSIS — K709 Alcoholic liver disease, unspecified: Secondary | ICD-10-CM | POA: Diagnosis not present

## 2017-11-23 DIAGNOSIS — Z23 Encounter for immunization: Secondary | ICD-10-CM | POA: Diagnosis not present

## 2017-11-23 DIAGNOSIS — R945 Abnormal results of liver function studies: Secondary | ICD-10-CM

## 2017-11-23 LAB — COMPREHENSIVE METABOLIC PANEL
ALBUMIN: 4.3 g/dL (ref 3.5–5.2)
ALT: 31 U/L (ref 0–53)
AST: 27 U/L (ref 0–37)
Alkaline Phosphatase: 106 U/L (ref 39–117)
BUN: 21 mg/dL (ref 6–23)
CALCIUM: 9.3 mg/dL (ref 8.4–10.5)
CHLORIDE: 94 meq/L — AB (ref 96–112)
CO2: 32 meq/L (ref 19–32)
Creatinine, Ser: 0.77 mg/dL (ref 0.40–1.50)
GFR: 111.12 mL/min (ref >60.00)
Glucose, Bld: 109 mg/dL — ABNORMAL HIGH (ref 70–99)
POTASSIUM: 3.3 meq/L — AB (ref 3.5–5.1)
SODIUM: 136 meq/L (ref 135–145)
Total Bilirubin: 0.7 mg/dL (ref 0.2–1.2)
Total Protein: 8.1 g/dL (ref 6.0–8.3)

## 2017-11-23 LAB — CBC WITH DIFFERENTIAL/PLATELET
BASOS PCT: 1.1 % (ref 0.0–3.0)
Basophils Absolute: 0.1 10*3/uL (ref 0.0–0.1)
EOS PCT: 4.1 % (ref 0.0–5.0)
Eosinophils Absolute: 0.4 10*3/uL (ref 0.0–0.7)
HEMATOCRIT: 41.1 % (ref 39.0–52.0)
HEMOGLOBIN: 14 g/dL (ref 13.0–17.0)
LYMPHS PCT: 18.9 % (ref 12.0–46.0)
Lymphs Abs: 1.8 10*3/uL (ref 0.7–4.0)
MCHC: 34.1 g/dL (ref 30.0–36.0)
MCV: 96.8 fl (ref 78.0–100.0)
Monocytes Absolute: 1.1 10*3/uL — ABNORMAL HIGH (ref 0.1–1.0)
Monocytes Relative: 11.1 % (ref 3.0–12.0)
NEUTROS ABS: 6.3 10*3/uL (ref 1.4–7.7)
Neutrophils Relative %: 64.8 % (ref 43.0–77.0)
Platelets: 333 10*3/uL (ref 150.0–400.0)
RBC: 4.24 Mil/uL (ref 4.22–5.81)
RDW: 13.9 % (ref 11.5–15.5)
WBC: 9.7 10*3/uL (ref 4.0–10.5)

## 2017-11-23 LAB — FERRITIN: FERRITIN: 366.2 ng/mL — AB (ref 22.0–322.0)

## 2017-11-23 MED ORDER — POTASSIUM CHLORIDE ER 20 MEQ PO TBCR: 20.0000 | EXTENDED_RELEASE_TABLET | Freq: Every day | ORAL | 1 refills | Status: DC

## 2017-11-23 NOTE — Progress Notes (Signed)
K is low  Kdur 20 mEq daily # 30 1 refill

## 2017-11-23 NOTE — Patient Instructions (Addendum)
Your physician has requested that you go to the basement for the lab work before leaving today.   Today we are giving you a flu vaccine to protect you for the 2018-2019 flu season.   Continue to abstain from alcohol.   Contact a treatment center for help.  Here are names of several , [facility] at [phone] and the [facility] [phone].    You have been [facility] abdominal [facility]/17 at 10:30AM. Please arrive 20 minutes prior to your appointment for registration. Make certain not to have anything to eat or drink 6 hours prior to your appointment. Should you need to reschedule your appointment, please contact radiology at [phone]. This test typically takes about 30 minutes to perform.   I appreciate the opportunity to care for you. Stan Headarl Gessner, MD, [facility]

## 2017-11-23 NOTE — Progress Notes (Signed)
Steven Daniels 56 y.o. [birth year] [id number]  Assessment & Plan:   Encounter Diagnoses  Name Primary?  . Alcoholic liver disease ([place]) suspected Yes  . Peripheral edema   . Abnormal LFTs   . Polysubstance abuse ([place])    The patient's peripheral edema is better by history.  He had abnormal transaminases in setting of heavier than normal chronic alcohol use.  He is a drug abuser.  Alcoholic liver disease could be responsible but I do not think we know that for sure at this point.  Other causes of edema like heart failure are possible though his BNP was not that elevated.  I plan to assess with a complete abdominal ultrasound.  Repeat chemistries, CBC.  Hepatitis A B and C screening and HIV screen given polysubstance abuse.  Ferritin and an antinuclear antibody will be undertaken as well.  Further plans pending these results.  Influenza vaccine today.  He claims he is committed to trying to become abstinent and pursue rehab.  I agree.  We have given him some phone numbers of service providers that could help with this.  Steven Mayer, MD, Steven Daniels    Subjective:   Chief Complaint:  HPI Patient is a 56 year old divorced white male here unaccompanied upon the request of the emergency department because of the suspicion of alcoholic liver disease leading to edema.  He has been in the emergency department multiple times in the last several weeks, in October he overdosed and was successfully treated with Narcan, he uses heroin and cocaine.  Then in December on the 14th and 20th he was seen for bilateral lower extremity edema and dyspnea, was treated with furosemide.  He had been drinking quite heavily beyond typical in the last few weeks.  Liver chemistries were elevated.  He was started on furosemide 40 mg daily and the edema has improved but still persists.  He has a chronic sharp tingly bilateral lower extremity pain.  He is GI review of systems seems otherwise unremarkable.  He said he  has not taken a drink of alcohol since the 14th.  He started drinking when he was 18.  He had a girlfriend up until recently or very recently and says she is in rehab her and to try to get rehabilitated and to get clean.  He denies IV drug abuse lately says he only snorts he did use cocaine and heroin yesterday.  Does not describe any abdominal pain nausea or vomiting.  There have been no fevers or chills reported.  He has a history of bipolar disorder or psychiatric disturbance he has been under treatment before but not in some time.  Evaluation in the emergency room and descent consisted of the following:  Chest x-ray twice on the 14th that showed pulmonary congestion mild cardiomegaly and some groundglass changes on the 20th that showed resolution of most of those other than mild pulmonary congestion  EKG with nonspecific inferior T wave changes otherwise normal sinus rhythm  Electrolytes liver chemistries, on the 14th his AST was 63 ALT 75 and then 31 and 37 normal bilirubin, slightly elevated ammonia at 37 lipase normal alk phos 162 and then 129 kidney function normal BNP slightly elevated at 150 on the 14th hemoglobin 12 MCV mildly elevated platelets normal white count normal troponins were negative in October and December and pro time was normal  In October urine drug screen was positive for benzodiazepines opiates cocaine and he had positive alcohol levels  TSH was normal in January 2017  Review of outside records shows that in 2017 he had normal liver chemistries.  Allergies  Allergen Reactions  . Other Shortness Of Breath    Reaction to cats  . Apple Other (See Comments)    Raw apples cause gum swelling  . Cherry Other (See Comments)    Raw cherries cause gum swelling  . Lisinopril Hives    Possible reaction to lisinopril Possible reaction to lisinopril  . Penicillins Other (See Comments)    Allergic reaction as a 56 year old Has patient had a PCN reaction causing immediate  rash, facial/tongue/throat swelling, SOB or lightheadedness with hypotension: no Has patient had a PCN reaction causing severe rash involving mucus membranes or skin necrosis: no Has patient had a PCN reaction that required hospitalization no Has patient had a PCN reaction occurring within the last 10 years: no If all of the above answers are "NO", then may proceed with Cephalosporin use.  Marland Kitchen Plum Pulp Other (See Comments)    Raw plums cause gum swelling   Current Meds  Medication Sig  . aspirin EC 81 MG tablet Take 81 mg by mouth daily.  . clindamycin (CLEOCIN) 150 MG capsule Take by mouth 3 (three) times daily.  . ferrous sulfate 325 (65 FE) MG tablet Take 325 mg by mouth daily with breakfast.  . furosemide (LASIX) 40 MG tablet Take 1 tablet (40 mg total) by mouth daily.  Marland Kitchen ibuprofen (ADVIL,MOTRIN) 800 MG tablet Take 800 mg by mouth every 8 (eight) hours as needed.  Marland Kitchen LORazepam (ATIVAN) 1 MG tablet Take 1 tablet (1 mg total) by mouth See admin instructions. Take 2 mg (2 tablets) by mouth every 4 hours for 6 doses, then take 2 mg (2 tablets) by mouth every  6 hours for 4 doses, then 1 mg (1 tablet) every 6 hours for 8 additional doses  . Multiple Vitamins-Minerals (MULTIVITAMIN WITH MINERALS) tablet Take 1 tablet by mouth daily.  . propranolol ER (INDERAL LA) 80 MG 24 hr capsule Take 80 mg by mouth daily.   Past Medical History:  Diagnosis Date  . Alcohol abuse   . Bipolar affective disorder ([place])   . Depression   . Hard of hearing   . Heart murmur    per pt 12/28/15  . Hypertension    Past Surgical History:  Procedure Laterality Date  . HIP FRACTURE SURGERY Right    x 3  . KNEE SURGERY Right    Social History   Social History Narrative   Divorced, no children   Veteran of [employer] [place] and a graduate of [employer] college with a degree in radio TV and marketing formally worked in that [place]   Cocaine heroine and alcohol use.  States intention to quit currently  started opiates after her first hip surgery   He is a smoker   11/23/2017      family history includes Alzheimer's disease in his mother; Breast cancer in his maternal grandmother; Heart disease in his cousin and paternal uncle; Other in his mother; Skin cancer in his father.   Review of Systems Decreased hearing.  As per HPI.  Objective:   Physical Exam _0  104/64   Pulse 86   Ht _1  (1.88 m)   Wt 221 lb (100.2 kg)   BMI 28.37 kg/m @  General:  Well-developed, well-nourished and in no acute distress Eyes:  anicteric. ENT:   Mouth and posterior pharynx free of lesions.  Hearing acuity is decreased.  There are  multiple dental fillings Neck:   supple w/o thyromegaly or mass.  Lungs: Clear to auscultation bilaterally. Heart:  S1S2, no rubs, murmurs, gallops. Abdomen:  soft, question mildly tender in the region of the spleen, no discrete hepatosplenomegaly but there is fullness in the left upper quadrant, hernia, or mass and BS+.  Lymph:  no cervical or supraclavicular adenopathy. Extremities:   no edema, cyanosis or clubbing Skin   no rash.  I do not see any stigmata of chronic liver disease.  There is a tattoo in the left upper chest Neuro:  A&O x 3.  Psych:  Seems a bit anxious and hypervigilant.   Data Reviewed:  See HPI

## 2017-11-24 NOTE — Progress Notes (Signed)
Let him know that his HIV is negative It looks like he has hepatitis C - confirmatory testing pending Will call again with update when other testing returns, including ultrasound

## 2017-11-25 LAB — HEPATITIS B SURFACE ANTIGEN: HEP B S AG: NONREACTIVE

## 2017-11-25 LAB — HCV RNA,QUANTITATIVE REAL TIME PCR
HCV QUANT LOG: 7.07 {Log_IU}/mL — AB
HCV RNA, PCR, QN: 11700000 [IU]/mL — AB

## 2017-11-25 LAB — HEPATITIS A ANTIBODY, TOTAL: Hepatitis A AB,Total: REACTIVE — AB

## 2017-11-25 LAB — HEPATITIS B SURFACE ANTIBODY,QUALITATIVE: HEP B S AB: NONREACTIVE

## 2017-11-25 LAB — ANA: ANA: NEGATIVE

## 2017-11-25 LAB — HEPATITIS C ANTIBODY
HEP C AB: REACTIVE — AB
SIGNAL TO CUT-OFF: 21.9 — ABNORMAL HIGH (ref <1.00)

## 2017-11-25 LAB — HEPATITIS B CORE ANTIBODY, TOTAL: Hep B Core Total Ab: NONREACTIVE

## 2017-11-25 LAB — HIV ANTIBODY (ROUTINE TESTING W REFLEX): HIV: NONREACTIVE

## 2017-11-28 ENCOUNTER — Ambulatory Visit
Admission: RE | Admit: 2017-11-28 | Discharge: 2017-11-28 | Disposition: A | Discharge status: Home / Self Care | Payer: Medicaid Other | Source: Ambulatory Visit | Attending: Internal Medicine | Admitting: Internal Medicine

## 2017-11-28 DIAGNOSIS — R7989 Other specified abnormal findings of blood chemistry: Secondary | ICD-10-CM

## 2017-11-28 DIAGNOSIS — R609 Edema, unspecified: Secondary | ICD-10-CM

## 2017-11-28 DIAGNOSIS — R945 Abnormal results of liver function studies: Secondary | ICD-10-CM

## 2017-11-28 LAB — HEPATITIS C GENOTYPE

## 2017-11-28 LAB — HCV RT-PCR, QUANT (NON-GRAPH)

## 2017-11-28 LAB — HCV RNA (INTERNATIONAL UNITS)
HCV RNA (International Units): 11400000 IU/mL
HCV log10: 7.057 log10 IU/mL

## 2017-11-28 LAB — HCV ANTIBODY CASCADE(PCR/GENO): HCV Ab: >11 s/co ratio — ABNORMAL HIGH (ref 0.0–0.9)

## 2017-11-30 ENCOUNTER — Other Ambulatory Visit: Payer: Medicaid Other

## 2017-12-01 ENCOUNTER — Ambulatory Visit
Admission: RE | Admit: 2017-12-01 | Discharge: 2017-12-01 | Disposition: A | Discharge status: Home / Self Care | Payer: Medicaid Other | Source: Ambulatory Visit | Attending: Internal Medicine | Admitting: Internal Medicine

## 2017-12-05 ENCOUNTER — Other Ambulatory Visit: Payer: Self-pay

## 2017-12-05 DIAGNOSIS — B1920 Unspecified viral hepatitis C without hepatic coma: Secondary | ICD-10-CM

## 2017-12-05 NOTE — Progress Notes (Signed)
He does have hepatitis C  Refer to [facility] please  Liver looks ok on ultrasound - no signs of cirrhosis I do d not think we can say leg swelling was from liver disease He should go to his PCP for follow-up of that  Stay off ETOH and drugs  Please fax/send ultrasound and lab results to his PCP

## 2017-12-05 NOTE — Progress Notes (Signed)
See lab result note Has HCV and will need to go to ID  Cannot say edema was due to liver disease needs to see PCP also

## 2017-12-07 ENCOUNTER — Telehealth: Payer: Self-pay | Admitting: Internal Medicine

## 2017-12-07 NOTE — Telephone Encounter (Signed)
I left a detailed message that ID will call him in a week or so to arrange the appt. He is also notified to follow up with Dr. Leone PayorGessner as needed

## 2017-12-09 DIAGNOSIS — F1410 Cocaine abuse, uncomplicated: Secondary | ICD-10-CM | POA: Insufficient documentation

## 2017-12-28 ENCOUNTER — Encounter: Payer: Self-pay | Admitting: Internal Medicine

## 2017-12-29 ENCOUNTER — Ambulatory Visit (INDEPENDENT_AMBULATORY_CARE_PROVIDER_SITE_OTHER): Payer: Medicaid Other | Admitting: Internal Medicine

## 2017-12-29 ENCOUNTER — Encounter: Payer: Self-pay | Admitting: Internal Medicine

## 2017-12-29 VITALS — BP 136/88 | HR 85 | Temp 98.2°F | Ht 74.0 in | Wt 221.0 lb

## 2017-12-29 DIAGNOSIS — F1910 Other psychoactive substance abuse, uncomplicated: Secondary | ICD-10-CM | POA: Diagnosis not present

## 2017-12-29 DIAGNOSIS — H9190 Unspecified hearing loss, unspecified ear: Secondary | ICD-10-CM | POA: Insufficient documentation

## 2017-12-29 DIAGNOSIS — Z96641 Presence of right artificial hip joint: Secondary | ICD-10-CM

## 2017-12-29 DIAGNOSIS — B182 Chronic viral hepatitis C: Secondary | ICD-10-CM | POA: Diagnosis not present

## 2017-12-29 DIAGNOSIS — Z23 Encounter for immunization: Secondary | ICD-10-CM | POA: Diagnosis not present

## 2017-12-29 DIAGNOSIS — F1990 Other psychoactive substance use, unspecified, uncomplicated: Secondary | ICD-10-CM | POA: Diagnosis not present

## 2017-12-29 DIAGNOSIS — G894 Chronic pain syndrome: Secondary | ICD-10-CM

## 2017-12-29 DIAGNOSIS — F17210 Nicotine dependence, cigarettes, uncomplicated: Secondary | ICD-10-CM

## 2017-12-29 DIAGNOSIS — H9103 Ototoxic hearing loss, bilateral: Secondary | ICD-10-CM | POA: Diagnosis not present

## 2017-12-29 DIAGNOSIS — G8929 Other chronic pain: Secondary | ICD-10-CM | POA: Insufficient documentation

## 2017-12-29 NOTE — Progress Notes (Signed)
HPI: Steven Daniels is a 57 y.o. male who is here to see Dr. Orvan Falconer for his hep C eval.   No results found for: HCVGENOTYPE, HEPCGENOTYPE  Allergies: Allergies  Allergen Reactions  . Other Shortness Of Breath    Reaction to cats  . Apple Other (See Comments)    Raw apples cause gum swelling  . Cherry Other (See Comments)    Raw cherries cause gum swelling  . Lisinopril Hives    Possible reaction to lisinopril Possible reaction to lisinopril  . Penicillins Other (See Comments)    Allergic reaction as a 57 year old Has patient had a PCN reaction causing immediate rash, facial/tongue/throat swelling, SOB or lightheadedness with hypotension: no Has patient had a PCN reaction causing severe rash involving mucus membranes or skin necrosis: no Has patient had a PCN reaction that required hospitalization no Has patient had a PCN reaction occurring within the last 10 years: no If all of the above answers are "NO", then may proceed with Cephalosporin use.  Marland Kitchen Plum Pulp Other (See Comments)    Raw plums cause gum swelling    Vitals: Temp: 98.2 F (36.8 C) (01/31 1504) Temp Source: Oral (01/31 1504) BP: 136/88 (01/31 1504) Pulse Rate: 85 (01/31 1504)  Past Medical History: Past Medical History:  Diagnosis Date  . Alcohol abuse   . Bipolar affective disorder (HCC)   . Depression   . Hard of hearing   . Heart murmur    per pt 12/28/15  . Hypertension     Social History: Social History   Socioeconomic History  . Marital status: Single    Spouse name: None  . Number of children: 0  . Years of education: None  . Highest education level: None  Social Needs  . Financial resource strain: None  . Food insecurity - worry: None  . Food insecurity - inability: None  . Transportation needs - medical: None  . Transportation needs - non-medical: None  Occupational History  . Occupation: disabled  Tobacco Use  . Smoking status: Current Every Day Smoker    Packs/day: 1.00   Types: Cigarettes  . Smokeless tobacco: Never Used  Substance and Sexual Activity  . Alcohol use: No    Frequency: Never    Comment: 2 40's a day  . Drug use: Yes    Types: Cocaine    Comment: crack, heroin -snort last use 12/27/2017  . Sexual activity: Yes    Birth control/protection: None  Other Topics Concern  . None  Social History Narrative   Divorced, no children   Veteran of 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  States intention to quit currently started opiates after her first hip surgery   He is a smoker   11/23/2017       Labs: Hep B S Ab (no units)  Date Value  11/23/2017 NON-REACTIVE   Hepatitis B Surface Ag (no units)  Date Value  11/23/2017 NON-REACTIVE    No results found for: HCVGENOTYPE, HEPCGENOTYPE  Hepatitis C RNA quantitative Latest Ref Rng & Units 11/23/2017  HCV Quantitative Log NOT DETECT Log IU/mL 7.07(H)    AST (U/L)  Date Value  11/23/2017 27  11/17/2017 31  11/11/2017 63 (H)   ALT (U/L)  Date Value  11/23/2017 31  11/17/2017 37  11/11/2017 75 (H)   INR (no units)  Date Value  11/11/2017 0.90    CrCl: CrCl cannot be calculated (Patient's most recent lab result is older than the maximum 21 days allowed.).  Fibrosis Score: <F4 as assessed by US  Child-Pugh Score: Class A  Previous Treatment Regimen: None  Assessment: Steven Daniels is here to see Dr. Orvan Falconerampbell for his hep C. Most of his labs are already done. He has 1a virus. His regular US didn't show any abnormality or cirrhosis. Dr. Orvan Falconerampbell will get a Fibrosure today. In majority of the case, we could file for med right after we get the fibrosure back, however, he admitted to using meth last week through snorting. He has a hx of IV drug use. He only has medicaid. Explain to him that, there is no way they would cover him right now because of the recent  drug use. He must stop using and commit himself to a treatment program before they would cover him for this. This could be a like an NA meeting for a month. Since he has medicaid, it'll likely be [place] when he gets approved for the medication. Dr. Orvan Falconerampbell would like us to see him next get and put a plan in place for this treatment.   Recommendations:  F/u with pharmacy in a week   Minh Pham, VermontPharm.D., BCPS, AAHIVP Clinical Infectious Disease Pharmacist [facility] 12/29/2017, 4:52 PM

## 2017-12-30 NOTE — Progress Notes (Signed)
[facility]  Reason for Consult: Chronic hepatitis C Referring Physician: Dr. Stan Head  Assessment: He has chronic, active hepatitis C and will be a candidate for treatment.  However, Medicaid will require that he be in treatment before being approved for therapy.  I have talked to him about this today as well as our ID pharmacist.  I will check a fibro-sure assay and plan on seeing him back in several weeks.  Plan: 1. He is encouraged to enter alcohol/drug treatment 2. Check fibrosure 3. Follow-up in 2-3 weeks  Patient Active Problem List   Diagnosis Date Noted  . Chronic hepatitis C without hepatic coma (HCC) 12/29/2017    Priority: High  . Polysubstance abuse (HCC) 12/29/2017  . IVDU (intravenous drug user) 12/29/2017  . Chronic pain 12/29/2017  . Cigarette smoker 12/29/2017  . Hearing loss 12/29/2017  . H/O total hip arthroplasty, right 12/29/2017  . Alcohol dependence with withdrawal, uncomplicated (HCC) 12/29/2015  . Severe recurrent major depression without psychotic features (HCC) 12/29/2015    Patient's Medications  New Prescriptions   No medications on file  Previous Medications   ASPIRIN EC 81 MG TABLET    Take 81 mg by mouth daily.   CLINDAMYCIN (CLEOCIN) 150 MG CAPSULE    Take by mouth 3 (three) times daily.   FERROUS SULFATE 325 (65 FE) MG TABLET    Take 325 mg by mouth daily with breakfast.   FUROSEMIDE (LASIX) 40 MG TABLET    Take 1 tablet (40 mg total) by mouth daily.   IBUPROFEN (ADVIL,MOTRIN) 800 MG TABLET    Take 800 mg by mouth every 8 (eight) hours as needed.   LORAZEPAM (ATIVAN) 1 MG TABLET    Take 1 tablet (1 mg total) by mouth See admin instructions. Take 2 mg (2 tablets) by mouth every 4 hours for 6 doses, then take 2 mg (2 tablets) by mouth every  6 hours for 4 doses, then 1 mg (1 tablet) every 6 hours for 8 additional doses   MULTIPLE VITAMINS-MINERALS (MULTIVITAMIN WITH MINERALS) TABLET    Take 1 tablet by  mouth daily.   POTASSIUM CHLORIDE ER 20 MEQ TBCR    Take 20 tablets by mouth daily.   PROPRANOLOL ER (INDERAL LA) 80 MG 24 HR CAPSULE    Take 80 mg by mouth daily.  Modified Medications   No medications on file  Discontinued Medications   No medications on file    HPI: Steven Daniels is a 57 y.o. male with hepatitis C.  He was recently evaluated and found to be genotype 1a with a viral load of 11,700,000. His liver enzymes and INR were normal in December.  He had no evidence of cirrhosis by exam or abdominal ultrasound.  He is HIV antibody negative.    He has a long-standing history of depression, heavy alcohol and polysubstance abuse.  He has a history of injecting drug use between 2016 and 2017.  He has continued to snort heroin and cocaine and had a positive drug screen last October.  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  He states that he is disabled because of chronic pain from his right hip.  He says that he has hard of hearing after being on antibiotics for his hip infection.  He is a cigarette smoker.  His mother is deceased.  His father died 1 year ago.  He  recently moved here from [place] to settle his father's estate.  He says that he is in a battle with his 2 sisters over the estate and that has been causing a great deal of distress.  He states that he went on an extreme alcohol "bender" recently.  He has never been in any alcohol or drug treatment.  Review of Systems: Review of Systems  Constitutional: Negative for chills, diaphoresis, fever, malaise/fatigue and weight loss.  HENT: Positive for hearing loss. Negative for congestion and sore throat.   Respiratory: Negative for cough, sputum production and shortness of breath.   Cardiovascular: Negative for chest pain.  Gastrointestinal: Negative for abdominal pain, diarrhea, heartburn, nausea and vomiting.  Genitourinary: Negative for dysuria and frequency.    Musculoskeletal: Positive for joint pain. Negative for myalgias.  Skin: Negative for rash.  Neurological: Negative for dizziness and headaches.  Psychiatric/Behavioral: Positive for depression and substance abuse. The patient is nervous/anxious.       Past Medical History:  Diagnosis Date  . Alcohol abuse   . Bipolar affective disorder (HCC)   . Depression   . Hard of hearing   . Heart murmur    per pt 12/28/15  . Hypertension     Social History   Tobacco Use  . Smoking status: Current Every Day Smoker    Packs/day: 1.00    Types: Cigarettes  . Smokeless tobacco: Never Used  Substance Use Topics  . Alcohol use: No    Frequency: Never    Comment: 2 40's a day  . Drug use: Yes    Types: Cocaine    Comment: crack, heroin -snort last use 12/27/2017    Family History  Problem Relation Age of Onset  . Other Mother        brain tumor behind left ear  . Alzheimer's disease Mother   . Skin cancer Father        melatomia  . Breast cancer Maternal Grandmother   . Heart disease Paternal Uncle   . Heart disease Cousin        fathers side  . Colon cancer Neg Hx   . Esophageal cancer Neg Hx    Allergies  Allergen Reactions  . Other Shortness Of Breath    Reaction to cats  . Apple Other (See Comments)    Raw apples cause gum swelling  . Cherry Other (See Comments)    Raw cherries cause gum swelling  . Lisinopril Hives    Possible reaction to lisinopril Possible reaction to lisinopril  . Penicillins Other (See Comments)    Allergic reaction as a 57 year old Has patient had a PCN reaction causing immediate rash, facial/tongue/throat swelling, SOB or lightheadedness with hypotension: no Has patient had a PCN reaction causing severe rash involving mucus membranes or skin necrosis: no Has patient had a PCN reaction that required hospitalization no Has patient had a PCN reaction occurring within the last 10 years: no If all of the above answers are "NO", then may proceed  with Cephalosporin use.  Marland Kitchen Plum Pulp Other (See Comments)    Raw plums cause gum swelling    OBJECTIVE: Vitals:   12/29/17 1504  BP: 136/88  Pulse: 85  Temp: 98.2 F (36.8 C)  TempSrc: Oral  Weight: 221 lb (100.2 kg)  Height: 6\' 2"  (1.88 m)   Body mass index is 28.37 kg/m.   Physical Exam  Constitutional: He is oriented to person, place, and time.  He is diaphoretic and anxious.  He is quite talkative.  HENT:  Mouth/Throat: No oropharyngeal exudate.  Eyes: Conjunctivae are normal.  Neck: Neck supple.  Cardiovascular: Normal rate and regular rhythm.  No murmur heard. Pulmonary/Chest: Effort normal and breath sounds normal. He has no wheezes. He has no rales.  Abdominal: Soft. He exhibits no mass. There is no tenderness.  Abdomen is obese.  Musculoskeletal: Normal range of motion. He exhibits no edema or tenderness.  He has a large healed incision over his right hip.  Neurological: He is alert and oriented to person, place, and time.  Skin: No rash noted.  Psychiatric: Mood and affect normal.    Microbiology: No results found for this or any previous visit (from the past 240 hour(s)).  Cliffton AstersJohn Anay Walter, MD [facility] [phone] pager   [phone] cell 12/30/2017, 3:03 PM

## 2018-01-02 NOTE — Progress Notes (Signed)
Noted.  I will follow his situation and once he is in a program, will file with Medicaid.  .At that time, we will need him to sign the Medicaid Readiness to Treat form.

## 2018-01-05 ENCOUNTER — Ambulatory Visit: Payer: Self-pay

## 2018-01-06 LAB — LIVER FIBROSIS, FIBROTEST-ACTITEST
ALPHA-2-MACROGLOBULIN: 266 mg/dL (ref 106–279)
ALT: 25 U/L (ref 9–46)
Apolipoprotein A1: 197 mg/dL — ABNORMAL HIGH (ref 94–176)
Bilirubin: 0.3 mg/dL (ref 0.2–1.2)
FIBROSIS SCORE: 0.17
GGT: 40 U/L (ref 3–85)
Haptoglobin: 184 mg/dL (ref 43–212)
NECROINFLAMMAT ACT SCORE: 0.09
Reference ID: 2317919

## 2018-01-26 ENCOUNTER — Ambulatory Visit (INDEPENDENT_AMBULATORY_CARE_PROVIDER_SITE_OTHER): Payer: Medicaid Other | Admitting: Behavioral Health

## 2018-01-26 ENCOUNTER — Telehealth: Payer: Self-pay | Admitting: Pharmacist Clinician (PhC)/ Clinical Pharmacy Specialist

## 2018-01-26 DIAGNOSIS — Z23 Encounter for immunization: Secondary | ICD-10-CM | POA: Diagnosis not present

## 2018-01-26 DIAGNOSIS — B182 Chronic viral hepatitis C: Secondary | ICD-10-CM

## 2018-01-26 NOTE — Telephone Encounter (Signed)
Steven Daniels is here for his hep B vaccine. He was a pt that is hep C positive but still using drugs. He has medicaid, therefore, he will not get coverage for this. He used today. He is about to go into a 28d rehab treatment program. He will f/u back with us after he is done with rehab.

## 2018-01-26 NOTE — Progress Notes (Signed)
Patient tolerated injection well.  Patient has an appointment in 6 months for last Hep B injection of the series. Steven SlimAshley Zebulon Gantt RN

## 2018-02-18 ENCOUNTER — Encounter (HOSPITAL_COMMUNITY): Payer: Self-pay | Admitting: Emergency Medicine

## 2018-02-18 ENCOUNTER — Emergency Department (HOSPITAL_COMMUNITY)
Admission: EM | Admit: 2018-02-18 | Discharge: 2018-02-19 | Disposition: A | Discharge status: Other Acute Inpatient Hospital | Payer: Medicaid Other | Attending: Emergency Medicine | Admitting: Emergency Medicine

## 2018-02-18 ENCOUNTER — Other Ambulatory Visit: Payer: Self-pay

## 2018-02-18 DIAGNOSIS — F1014 Alcohol abuse with alcohol-induced mood disorder: Secondary | ICD-10-CM

## 2018-02-18 DIAGNOSIS — Z79899 Other long term (current) drug therapy: Secondary | ICD-10-CM | POA: Insufficient documentation

## 2018-02-18 DIAGNOSIS — R110 Nausea: Secondary | ICD-10-CM | POA: Insufficient documentation

## 2018-02-18 DIAGNOSIS — F419 Anxiety disorder, unspecified: Secondary | ICD-10-CM | POA: Insufficient documentation

## 2018-02-18 DIAGNOSIS — R45851 Suicidal ideations: Secondary | ICD-10-CM | POA: Insufficient documentation

## 2018-02-18 DIAGNOSIS — F1020 Alcohol dependence, uncomplicated: Secondary | ICD-10-CM | POA: Diagnosis present

## 2018-02-18 DIAGNOSIS — Z046 Encounter for general psychiatric examination, requested by authority: Secondary | ICD-10-CM | POA: Insufficient documentation

## 2018-02-18 DIAGNOSIS — F17210 Nicotine dependence, cigarettes, uncomplicated: Secondary | ICD-10-CM | POA: Insufficient documentation

## 2018-02-18 DIAGNOSIS — Z7982 Long term (current) use of aspirin: Secondary | ICD-10-CM | POA: Insufficient documentation

## 2018-02-18 DIAGNOSIS — F329 Major depressive disorder, single episode, unspecified: Secondary | ICD-10-CM | POA: Insufficient documentation

## 2018-02-18 DIAGNOSIS — R251 Tremor, unspecified: Secondary | ICD-10-CM | POA: Insufficient documentation

## 2018-02-18 DIAGNOSIS — I10 Essential (primary) hypertension: Secondary | ICD-10-CM | POA: Insufficient documentation

## 2018-02-18 DIAGNOSIS — F332 Major depressive disorder, recurrent severe without psychotic features: Secondary | ICD-10-CM | POA: Diagnosis present

## 2018-02-18 LAB — COMPREHENSIVE METABOLIC PANEL
ALT: 43 U/L (ref 17–63)
AST: 36 U/L (ref 15–41)
Albumin: 4 g/dL (ref 3.5–5.0)
Alkaline Phosphatase: 95 U/L (ref 38–126)
Anion gap: 12 (ref 5–15)
BUN: 13 mg/dL (ref 6–20)
CHLORIDE: 104 mmol/L (ref 101–111)
CO2: 21 mmol/L — AB (ref 22–32)
CREATININE: 0.6 mg/dL — AB (ref 0.61–1.24)
Calcium: 8.8 mg/dL — ABNORMAL LOW (ref 8.9–10.3)
Glucose, Bld: 105 mg/dL — ABNORMAL HIGH (ref 65–99)
POTASSIUM: 3.5 mmol/L (ref 3.5–5.1)
SODIUM: 137 mmol/L (ref 135–145)
Total Bilirubin: 0.3 mg/dL (ref 0.3–1.2)
Total Protein: 7.8 g/dL (ref 6.5–8.1)

## 2018-02-18 LAB — RAPID URINE DRUG SCREEN, HOSP PERFORMED
AMPHETAMINES: NOT DETECTED
BARBITURATES: NOT DETECTED
BENZODIAZEPINES: NOT DETECTED
COCAINE: POSITIVE — AB
Opiates: NOT DETECTED
Tetrahydrocannabinol: NOT DETECTED

## 2018-02-18 LAB — CBC
HEMATOCRIT: 49.8 % (ref 39.0–52.0)
Hemoglobin: 16.8 g/dL (ref 13.0–17.0)
MCH: 31.1 pg (ref 26.0–34.0)
MCHC: 33.7 g/dL (ref 30.0–36.0)
MCV: 92.1 fL (ref 78.0–100.0)
Platelets: 239 10*3/uL (ref 150–400)
RBC: 5.41 MIL/uL (ref 4.22–5.81)
RDW: 13.2 % (ref 11.5–15.5)
WBC: 13.6 10*3/uL — AB (ref 4.0–10.5)

## 2018-02-18 LAB — ACETAMINOPHEN LEVEL: Acetaminophen (Tylenol), Serum: <10 ug/mL — ABNORMAL LOW (ref 10–30)

## 2018-02-18 LAB — ETHANOL: ALCOHOL ETHYL (B): 28 mg/dL — AB (ref <10)

## 2018-02-18 LAB — SALICYLATE LEVEL

## 2018-02-18 MED ORDER — AMLODIPINE BESYLATE 5 MG PO TABS
5.0000 mg | ORAL_TABLET | Freq: Every day | ORAL | Status: DC
  Administered 2018-02-18 – 2018-02-19 (×2): 5 mg via ORAL
  Filled 2018-02-18 (×2): qty 1

## 2018-02-18 MED ORDER — POTASSIUM CHLORIDE ER 20 MEQ PO TBCR: 20.0000 | EXTENDED_RELEASE_TABLET | Freq: Every day | ORAL | Status: DC

## 2018-02-18 MED ORDER — LORAZEPAM 1 MG PO TABS
1.0000 mg | ORAL_TABLET | Freq: Four times a day (QID) | ORAL | Status: DC | PRN
  Administered 2018-02-18: 1 mg via ORAL
  Filled 2018-02-18: qty 1

## 2018-02-18 MED ORDER — LORAZEPAM 1 MG PO TABS
1.0000 mg | ORAL_TABLET | Freq: Four times a day (QID) | ORAL | Status: DC
  Administered 2018-02-18 (×2): 1 mg via ORAL
  Filled 2018-02-18 (×3): qty 1

## 2018-02-18 MED ORDER — LORAZEPAM 1 MG PO TABS: 1.0000 mg | ORAL_TABLET | Freq: Two times a day (BID) | ORAL | Status: DC

## 2018-02-18 MED ORDER — LORAZEPAM 1 MG PO TABS: 1.0000 mg | ORAL_TABLET | Freq: Every day | ORAL | Status: DC

## 2018-02-18 MED ORDER — ADULT MULTIVITAMIN W/MINERALS CH
1.0000 | ORAL_TABLET | Freq: Every day | ORAL | Status: DC
  Administered 2018-02-18 – 2018-02-19 (×2): 1 via ORAL
  Filled 2018-02-18 (×2): qty 1

## 2018-02-18 MED ORDER — LOPERAMIDE HCL 2 MG PO CAPS: 2.0000 mg | ORAL_CAPSULE | ORAL | Status: DC | PRN

## 2018-02-18 MED ORDER — VITAMIN B-1 100 MG PO TABS
100.0000 mg | ORAL_TABLET | Freq: Every day | ORAL | Status: DC
  Administered 2018-02-18 – 2018-02-19 (×2): 100 mg via ORAL
  Filled 2018-02-18: qty 1

## 2018-02-18 MED ORDER — LORAZEPAM 1 MG PO TABS: 1.0000 mg | ORAL_TABLET | Freq: Three times a day (TID) | ORAL | Status: DC

## 2018-02-18 MED ORDER — HYDROXYZINE HCL 25 MG PO TABS
25.0000 mg | ORAL_TABLET | Freq: Four times a day (QID) | ORAL | Status: DC | PRN
  Administered 2018-02-19: 25 mg via ORAL
  Filled 2018-02-18: qty 1

## 2018-02-18 MED ORDER — POTASSIUM CHLORIDE CRYS ER 20 MEQ PO TBCR
20.0000 meq | EXTENDED_RELEASE_TABLET | Freq: Every day | ORAL | Status: DC
Start: 2018-02-18 — End: 2018-02-19
  Administered 2018-02-18 – 2018-02-19 (×2): 20 meq via ORAL
  Filled 2018-02-18 (×2): qty 1

## 2018-02-18 MED ORDER — PROPRANOLOL HCL ER 80 MG PO CP24
80.0000 mg | ORAL_CAPSULE | Freq: Every day | ORAL | Status: DC
  Administered 2018-02-18 – 2018-02-19 (×2): 80 mg via ORAL
  Filled 2018-02-18 (×2): qty 1

## 2018-02-18 MED ORDER — THIAMINE HCL 100 MG/ML IJ SOLN: 100.0000 mg | Freq: Once | INTRAMUSCULAR | Status: DC

## 2018-02-18 MED ORDER — ONDANSETRON 4 MG PO TBDP
4.0000 mg | ORAL_TABLET | Freq: Four times a day (QID) | ORAL | Status: DC | PRN
  Administered 2018-02-18: 4 mg via ORAL
  Filled 2018-02-18: qty 1

## 2018-02-18 MED ORDER — LORAZEPAM 1 MG PO TABS
2.0000 mg | ORAL_TABLET | Freq: Once | ORAL | Status: AC
  Administered 2018-02-18: 2 mg via ORAL
  Filled 2018-02-18: qty 2
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
## 2018-02-18 NOTE — ED Notes (Signed)
Patient up to side of bed eating dinner.

## 2018-02-18 NOTE — ED Notes (Signed)
Patient talking with TTS 

## 2018-02-18 NOTE — ED Provider Notes (Addendum)
[facility]-EMERGENCY DEPT Provider Note   CSN: [id number] Arrival date & time: 02/18/18  1421     History   Chief Complaint Chief Complaint  Patient presents with  . Suicidal    HPI Steven Daniels is a 57 y.o. male.  This is a 57 year old male with history of bipolar disorder as well as hepatitis C presents with suicidal ideations with plan to drink himself to death.  Patient admits to heavy alcohol use daily and states that due to going through more depression recently he has been drinking until passing out for the past few days.  Denies any illicit drug use.  Does have a prior history of suicide attempt which involved putting a gun to his head.  Denies any possession of firearms at this time.  Has not been responding to internal stimuli.  States that he needs detox from alcohol and has attempted this in the past and was successful for about 2 years.  No other treatments used prior to arrival     Past Medical History:  Diagnosis Date  . Alcohol abuse   . Bipolar affective disorder (HCC)   . Depression   . Hard of hearing   . Heart murmur    per pt 12/28/15  . Hypertension     Patient Active Problem List   Diagnosis Date Noted  . Chronic hepatitis C without hepatic coma (HCC) 12/29/2017  . Polysubstance abuse (HCC) 12/29/2017  . IVDU (intravenous drug user) 12/29/2017  . Chronic pain 12/29/2017  . Cigarette smoker 12/29/2017  . Hearing loss 12/29/2017  . H/O total hip arthroplasty, right 12/29/2017  . Alcohol dependence with withdrawal, uncomplicated (HCC) 12/29/2015  . Severe recurrent major depression without psychotic features (HCC) 12/29/2015    Past Surgical History:  Procedure Laterality Date  . HIP FRACTURE SURGERY Right    x 3  . KNEE SURGERY Right         Home Medications    Prior to Admission medications   Medication Sig Start Date End Date Taking? Authorizing Provider  aspirin EC 81 MG tablet Take 81 mg by mouth daily.     [provider]  clindamycin (CLEOCIN) 150 MG capsule Take by mouth 3 (three) times daily.    [provider]  ferrous sulfate 325 (65 FE) MG tablet Take 325 mg by mouth daily with breakfast.    [provider]  furosemide (LASIX) 40 MG tablet Take 1 tablet (40 mg total) by mouth daily. 11/17/17   Donnetta Hutchingook, Brian, MD  ibuprofen (ADVIL,MOTRIN) 800 MG tablet Take 800 mg by mouth every 8 (eight) hours as needed.    [provider]  LORazepam (ATIVAN) 1 MG tablet Take 1 tablet (1 mg total) by mouth See admin instructions. Take 2 mg (2 tablets) by mouth every 4 hours for 6 doses, then take 2 mg (2 tablets) by mouth every  6 hours for 4 doses, then 1 mg (1 tablet) every 6 hours for 8 additional doses Patient not taking: Reported on 12/29/2017 11/11/17   Lavera GuiseLiu, Dana Duo, MD  Multiple Vitamins-Minerals (MULTIVITAMIN WITH MINERALS) tablet Take 1 tablet by mouth daily.    [provider]  Potassium Chloride ER 20 MEQ TBCR Take 20 tablets by mouth daily. 11/23/17   Iva BoopGessner, Carl E, MD  propranolol ER (INDERAL LA) 80 MG 24 hr capsule Take 80 mg by mouth daily. 12/03/16   [provider]    Family History Family History  Problem Relation Age of Onset  .  Other Mother        brain tumor behind left ear  . Alzheimer's disease Mother   . Skin cancer Father        melatomia  . Breast cancer Maternal Grandmother   . Heart disease Paternal Uncle   . Heart disease Cousin        fathers side  . Colon cancer Neg Hx   . Esophageal cancer Neg Hx     Social History Social History   Tobacco Use  . Smoking status: Current Every Day Smoker    Packs/day: 1.00    Types: Cigarettes  . Smokeless tobacco: Never Used  Substance Use Topics  . Alcohol use: No    Frequency: Never    Comment: 2 40's a day  . Drug use: Yes    Types: Cocaine    Comment: crack, heroin -snort last use 12/27/2017     Allergies   Other; Apple; Cherry; Lisinopril; Penicillins; and Plum  pulp   Review of Systems Review of Systems  All other systems reviewed and are negative.    Physical Exam Updated Vital Signs BP (!) 168/100 (BP Location: Left Arm)   Pulse (!) 101   Temp 98.7 F (37.1 C) (Oral)   Resp 16   SpO2 97%   Physical Exam  Constitutional: He is oriented to person, place, and time. He appears well-developed and well-nourished.  Non-toxic appearance. No distress.  HENT:  Head: Normocephalic and atraumatic.  Eyes: Pupils are equal, round, and reactive to light. Conjunctivae, EOM and lids are normal.  Neck: Normal range of motion. Neck supple. No tracheal deviation present. No thyroid mass present.  Cardiovascular: Normal rate, regular rhythm and normal heart sounds. Exam reveals no gallop.  No murmur heard. Pulmonary/Chest: Effort normal and breath sounds normal. No stridor. No respiratory distress. He has no decreased breath sounds. He has no wheezes. He has no rhonchi. He has no rales.  Abdominal: Soft. Normal appearance and bowel sounds are normal. He exhibits no distension. There is no tenderness. There is no rebound and no CVA tenderness.  Musculoskeletal: Normal range of motion. He exhibits no edema or tenderness.  Neurological: He is alert and oriented to person, place, and time. He has normal strength. He displays tremor. No cranial nerve deficit or sensory deficit. GCS eye subscore is 4. GCS verbal subscore is 5. GCS motor subscore is 6.  Skin: Skin is warm and dry. No abrasion and no rash noted.  Psychiatric: His speech is normal. His affect is blunt. He is withdrawn. He expresses suicidal ideation. He expresses suicidal plans.  Nursing note and vitals reviewed.    ED Treatments / Results  Labs (all labs ordered are listed, but only abnormal results are displayed) Labs Reviewed  COMPREHENSIVE METABOLIC PANEL  ETHANOL  CBC  RAPID URINE DRUG SCREEN, HOSP PERFORMED  SALICYLATE LEVEL  ACETAMINOPHEN LEVEL    EKG None  Radiology No  results found.  Procedures Procedures (including critical care time)  Medications Ordered in ED Medications  LORazepam (ATIVAN) tablet 2 mg (has no administration in time range)     Initial Impression / Assessment and Plan / ED Course  I have reviewed the triage vital signs and the nursing notes.  Pertinent labs & imaging results that were available during my care of the patient were reviewed by me and considered in my medical decision making (see chart for details).    Patient treated with Ativan for resting tremor likely from alcohol withdrawal.  He  is not hallucinating.  Will place on Ativan alcohol withdrawal protocol.  He is currently medically cleared for psychiatric disposition  Final Clinical Impressions(s) / ED Diagnoses   Final diagnoses:  None    ED Discharge Orders    None       Lorre Nick, MD 02/18/18 1701    Lorre Nick, MD 02/18/18 1701

## 2018-02-18 NOTE — ED Notes (Signed)
TTS personnel reported that patient became very red in the face while he was talking with patient.  TTS became concerned and alerted RN who assessed patient and took vital signs.  Patient reported he was very thirsty and felt nauseated.  Gave patient prn Zofran and another mg of Ativan per order.  Encouraged fluids.  Patient falling in and out of sleep with a strong startle reflex.

## 2018-02-18 NOTE — ED Triage Notes (Signed)
Patient states he is bipolar and has been "drinking like a fish". Currently possibly going through DTs. States he has about 6 beers a day and only had one today. Says he has tried to kill himself with pills and is actively wanting to do so again. He has hepatitis C and was trying to kill himself with alcohol at one point. Has also not taken any medication for several days.

## 2018-02-18 NOTE — ED Notes (Signed)
SBAR Report received from previous nurse. Pt received asleep and unable to participate in assessment of current SI/ HI, A/V H, depression, anxiety, or pain at this time, and appears otherwise stable and free of distress. Pt reminded of camera surveillance, q 15 min rounds, and rules of the milieu. Will continue to assess. 

## 2018-02-19 ENCOUNTER — Inpatient Hospital Stay (HOSPITAL_COMMUNITY)
Admission: AD | Admit: 2018-02-19 | Discharge: 2018-03-01 | DRG: 885 | Disposition: A | Discharge status: Home / Self Care | Payer: Medicaid Other | Source: Intra-hospital | Attending: Psychiatry | Admitting: Psychiatry

## 2018-02-19 DIAGNOSIS — R45851 Suicidal ideations: Secondary | ICD-10-CM | POA: Diagnosis present

## 2018-02-19 DIAGNOSIS — Z96641 Presence of right artificial hip joint: Secondary | ICD-10-CM | POA: Diagnosis present

## 2018-02-19 DIAGNOSIS — F419 Anxiety disorder, unspecified: Secondary | ICD-10-CM | POA: Diagnosis present

## 2018-02-19 DIAGNOSIS — G4700 Insomnia, unspecified: Secondary | ICD-10-CM | POA: Diagnosis not present

## 2018-02-19 DIAGNOSIS — F1014 Alcohol abuse with alcohol-induced mood disorder: Secondary | ICD-10-CM | POA: Diagnosis not present

## 2018-02-19 DIAGNOSIS — F1410 Cocaine abuse, uncomplicated: Secondary | ICD-10-CM | POA: Diagnosis present

## 2018-02-19 DIAGNOSIS — F1024 Alcohol dependence with alcohol-induced mood disorder: Secondary | ICD-10-CM | POA: Diagnosis present

## 2018-02-19 DIAGNOSIS — Z79899 Other long term (current) drug therapy: Secondary | ICD-10-CM | POA: Diagnosis not present

## 2018-02-19 DIAGNOSIS — Z810 Family history of intellectual disabilities: Secondary | ICD-10-CM | POA: Diagnosis not present

## 2018-02-19 DIAGNOSIS — F17210 Nicotine dependence, cigarettes, uncomplicated: Secondary | ICD-10-CM | POA: Diagnosis present

## 2018-02-19 DIAGNOSIS — Z915 Personal history of self-harm: Secondary | ICD-10-CM

## 2018-02-19 DIAGNOSIS — Z818 Family history of other mental and behavioral disorders: Secondary | ICD-10-CM | POA: Diagnosis not present

## 2018-02-19 DIAGNOSIS — F332 Major depressive disorder, recurrent severe without psychotic features: Secondary | ICD-10-CM

## 2018-02-19 DIAGNOSIS — R450 Nervousness: Secondary | ICD-10-CM | POA: Diagnosis not present

## 2018-02-19 DIAGNOSIS — F10239 Alcohol dependence with withdrawal, unspecified: Secondary | ICD-10-CM | POA: Diagnosis present

## 2018-02-19 DIAGNOSIS — F314 Bipolar disorder, current episode depressed, severe, without psychotic features: Secondary | ICD-10-CM | POA: Diagnosis present

## 2018-02-19 DIAGNOSIS — I10 Essential (primary) hypertension: Secondary | ICD-10-CM | POA: Diagnosis present

## 2018-02-19 DIAGNOSIS — Z62811 Personal history of psychological abuse in childhood: Secondary | ICD-10-CM | POA: Diagnosis not present

## 2018-02-19 DIAGNOSIS — F1020 Alcohol dependence, uncomplicated: Secondary | ICD-10-CM | POA: Diagnosis present

## 2018-02-19 DIAGNOSIS — Z811 Family history of alcohol abuse and dependence: Secondary | ICD-10-CM | POA: Diagnosis not present

## 2018-02-19 DIAGNOSIS — F1910 Other psychoactive substance abuse, uncomplicated: Secondary | ICD-10-CM | POA: Diagnosis not present

## 2018-02-19 DIAGNOSIS — Z62810 Personal history of physical and sexual abuse in childhood: Secondary | ICD-10-CM | POA: Diagnosis not present

## 2018-02-19 DIAGNOSIS — B182 Chronic viral hepatitis C: Secondary | ICD-10-CM | POA: Diagnosis not present

## 2018-02-19 DIAGNOSIS — F1099 Alcohol use, unspecified with unspecified alcohol-induced disorder: Secondary | ICD-10-CM | POA: Diagnosis not present

## 2018-02-19 DIAGNOSIS — Z813 Family history of other psychoactive substance abuse and dependence: Secondary | ICD-10-CM | POA: Diagnosis not present

## 2018-02-19 DIAGNOSIS — Z736 Limitation of activities due to disability: Secondary | ICD-10-CM | POA: Diagnosis not present

## 2018-02-19 MED ORDER — PROPRANOLOL HCL ER 80 MG PO CP24
80.0000 mg | ORAL_CAPSULE | Freq: Every day | ORAL | Status: DC
  Administered 2018-02-20 – 2018-02-21 (×2): 80 mg via ORAL
  Filled 2018-02-19 (×5): qty 1

## 2018-02-19 MED ORDER — MAGNESIUM HYDROXIDE 400 MG/5ML PO SUSP
30.0000 mL | Freq: Every day | ORAL | Status: DC | PRN
  Administered 2018-02-21: 30 mL via ORAL
  Filled 2018-02-19: qty 30

## 2018-02-19 MED ORDER — LORAZEPAM 1 MG PO TABS: 1.0000 mg | ORAL_TABLET | Freq: Four times a day (QID) | ORAL | Status: DC | PRN

## 2018-02-19 MED ORDER — ACETAMINOPHEN 325 MG PO TABS: 650.0000 mg | ORAL_TABLET | Freq: Four times a day (QID) | ORAL | Status: DC | PRN

## 2018-02-19 MED ORDER — CHLORDIAZEPOXIDE HCL 25 MG PO CAPS: 25.0000 mg | ORAL_CAPSULE | Freq: Every day | ORAL | Status: DC

## 2018-02-19 MED ORDER — ONDANSETRON 4 MG PO TBDP
4.0000 mg | ORAL_TABLET | Freq: Four times a day (QID) | ORAL | Status: AC | PRN
  Administered 2018-02-20 (×3): 4 mg via ORAL
  Filled 2018-02-19 (×4): qty 1

## 2018-02-19 MED ORDER — GABAPENTIN 300 MG PO CAPS
300.0000 mg | ORAL_CAPSULE | Freq: Two times a day (BID) | ORAL | Status: DC
  Administered 2018-02-19: 300 mg via ORAL
  Filled 2018-02-19: qty 1

## 2018-02-19 MED ORDER — CHLORDIAZEPOXIDE HCL 25 MG PO CAPS: 25.0000 mg | ORAL_CAPSULE | ORAL | Status: DC

## 2018-02-19 MED ORDER — LORAZEPAM 1 MG PO TABS
1.0000 mg | ORAL_TABLET | Freq: Four times a day (QID) | ORAL | Status: DC
  Administered 2018-02-19 (×2): 1 mg via ORAL
  Filled 2018-02-19 (×2): qty 1

## 2018-02-19 MED ORDER — VITAMIN B-1 100 MG PO TABS
100.0000 mg | ORAL_TABLET | Freq: Every day | ORAL | Status: DC
  Administered 2018-02-19: 100 mg via ORAL
  Filled 2018-02-19: qty 1

## 2018-02-19 MED ORDER — AMLODIPINE BESYLATE 5 MG PO TABS
5.0000 mg | ORAL_TABLET | Freq: Every day | ORAL | Status: DC
  Administered 2018-02-20 – 2018-02-22 (×3): 5 mg via ORAL
  Filled 2018-02-19 (×6): qty 1

## 2018-02-19 MED ORDER — LORAZEPAM 1 MG PO TABS
1.0000 mg | ORAL_TABLET | Freq: Four times a day (QID) | ORAL | Status: AC | PRN
  Administered 2018-02-20 – 2018-02-22 (×6): 1 mg via ORAL
  Filled 2018-02-19 (×6): qty 1

## 2018-02-19 MED ORDER — ADULT MULTIVITAMIN W/MINERALS CH
1.0000 | ORAL_TABLET | Freq: Every day | ORAL | Status: DC
  Administered 2018-02-20 – 2018-02-28 (×9): 1 via ORAL
  Filled 2018-02-19 (×12): qty 1

## 2018-02-19 MED ORDER — LORAZEPAM 1 MG PO TABS: 1.0000 mg | ORAL_TABLET | Freq: Every day | ORAL | Status: DC

## 2018-02-19 MED ORDER — LORAZEPAM 1 MG PO TABS
1.0000 mg | ORAL_TABLET | Freq: Four times a day (QID) | ORAL | Status: AC
  Administered 2018-02-20 – 2018-02-21 (×5): 1 mg via ORAL
  Filled 2018-02-19 (×6): qty 1

## 2018-02-19 MED ORDER — LORAZEPAM 1 MG PO TABS: 1.0000 mg | ORAL_TABLET | Freq: Three times a day (TID) | ORAL | Status: DC

## 2018-02-19 MED ORDER — ALUM & MAG HYDROXIDE-SIMETH 200-200-20 MG/5ML PO SUSP: 30.0000 mL | ORAL | Status: DC | PRN

## 2018-02-19 MED ORDER — HYDROXYZINE HCL 25 MG PO TABS
25.0000 mg | ORAL_TABLET | Freq: Four times a day (QID) | ORAL | Status: AC | PRN
  Administered 2018-02-20 – 2018-02-22 (×4): 25 mg via ORAL
  Filled 2018-02-19 (×4): qty 1

## 2018-02-19 MED ORDER — TRAZODONE HCL 100 MG PO TABS: 100.0000 mg | ORAL_TABLET | Freq: Every day | ORAL | Status: DC

## 2018-02-19 MED ORDER — CHLORDIAZEPOXIDE HCL 25 MG PO CAPS: 25.0000 mg | ORAL_CAPSULE | Freq: Four times a day (QID) | ORAL | Status: DC

## 2018-02-19 MED ORDER — VITAMIN B-1 100 MG PO TABS
100.0000 mg | ORAL_TABLET | Freq: Every day | ORAL | Status: DC
  Administered 2018-02-20 – 2018-02-28 (×9): 100 mg via ORAL
  Filled 2018-02-19 (×12): qty 1

## 2018-02-19 MED ORDER — POTASSIUM CHLORIDE CRYS ER 20 MEQ PO TBCR
20.0000 meq | EXTENDED_RELEASE_TABLET | Freq: Every day | ORAL | Status: DC
  Administered 2018-02-20 – 2018-02-28 (×9): 20 meq via ORAL
  Filled 2018-02-19: qty 14
  Filled 2018-02-19 (×11): qty 1
  Filled 2018-02-19: qty 14

## 2018-02-19 MED ORDER — LOPERAMIDE HCL 2 MG PO CAPS
2.0000 mg | ORAL_CAPSULE | ORAL | Status: AC | PRN
  Administered 2018-02-20: 4 mg via ORAL
  Filled 2018-02-19: qty 2

## 2018-02-19 MED ORDER — CHLORDIAZEPOXIDE HCL 25 MG PO CAPS: 25.0000 mg | ORAL_CAPSULE | Freq: Four times a day (QID) | ORAL | Status: DC | PRN

## 2018-02-19 MED ORDER — LORAZEPAM 1 MG PO TABS: 1.0000 mg | ORAL_TABLET | Freq: Two times a day (BID) | ORAL | Status: DC

## 2018-02-19 MED ORDER — CHLORDIAZEPOXIDE HCL 25 MG PO CAPS: 25.0000 mg | ORAL_CAPSULE | Freq: Three times a day (TID) | ORAL | Status: DC

## 2018-02-19 MED ORDER — LORAZEPAM 1 MG PO TABS
1.0000 mg | ORAL_TABLET | Freq: Two times a day (BID) | ORAL | Status: AC
  Administered 2018-02-22 – 2018-02-23 (×2): 1 mg via ORAL
  Filled 2018-02-19 (×2): qty 1

## 2018-02-19 MED ORDER — LORAZEPAM 1 MG PO TABS
1.0000 mg | ORAL_TABLET | Freq: Every day | ORAL | Status: AC
  Administered 2018-02-24: 1 mg via ORAL
  Filled 2018-02-19: qty 1

## 2018-02-19 MED ORDER — TRAZODONE HCL 50 MG PO TABS
50.0000 mg | ORAL_TABLET | Freq: Every evening | ORAL | Status: DC | PRN
  Administered 2018-02-20 – 2018-02-22 (×3): 50 mg via ORAL
  Filled 2018-02-19 (×4): qty 1

## 2018-02-19 MED ORDER — NICOTINE 21 MG/24HR TD PT24
21.0000 mg | MEDICATED_PATCH | Freq: Once | TRANSDERMAL | Status: DC
  Administered 2018-02-19: 21 mg via TRANSDERMAL
  Filled 2018-02-19: qty 1

## 2018-02-19 MED ORDER — LORAZEPAM 1 MG PO TABS
1.0000 mg | ORAL_TABLET | Freq: Three times a day (TID) | ORAL | Status: AC
  Administered 2018-02-21 – 2018-02-22 (×3): 1 mg via ORAL
  Filled 2018-02-19 (×3): qty 1

## 2018-02-19 NOTE — ED Notes (Signed)
[facility] [place] [facility] called for transfer

## 2018-02-19 NOTE — Consult Note (Addendum)
[facility] Consult   Reason for Consult:  Suicide attempt Referring Physician:  EDP Patient Identification: Steven Daniels MRN:  [id number] Principal Diagnosis: Severe recurrent major depression without psychotic features [facility]) Diagnosis:   Patient Active Problem List   Diagnosis Date Noted  . Alcohol dependence with withdrawal, uncomplicated ([place]) [G26.948] 12/29/2015    Priority: High  . Severe recurrent major depression without psychotic features ([place]) [F33.2] 12/29/2015    Priority: High  . Alcohol abuse with alcohol-induced mood disorder ([place]) [F10.14] 02/19/2018  . Chronic hepatitis C without hepatic coma ([place]) [B18.2] 12/29/2017  . Polysubstance abuse ([place]) [F19.10] 12/29/2017  . IVDU (intravenous drug user) [F19.90] 12/29/2017  . Chronic pain [G89.29] 12/29/2017  . Cigarette smoker [F17.210] 12/29/2017  . Hearing loss [H91.90] 12/29/2017  . H/O total hip arthroplasty, right [Z96.641] 12/29/2017    Total Time spent with patient: 45 minutes  Subjective:   Steven Daniels is a 57 y.o. male patient admitted with suicide attempt.  HPI:  Steven Daniels is a 57 year old white male who presented to [facility] ED for concerns with depression and seeking rehab for alcohol and drug use.  He attempted to kill himself two days ago by injecting too much heroin.   Drinking 6-40 oz beers daily along with cocaine daily.  Self isolating, endorses feelings of worthlessness, hopelessness, and helplessness.  Agreeable to get help for his depression.  Past Psychiatric History: polysubstance abuse, depression  Risk to Self: Suicidal Ideation: Yes-Currently Present Suicidal Intent: Yes-Currently Present Is patient at risk for suicide?: Yes Suicidal Plan?: Yes-Currently Present(drink self to death) Specify Current Suicidal Plan: (drink too much alcohol) Access to Means: Yes Specify Access to Suicidal Means: (can access alcohol) What has been your use of drugs/alcohol within the last 12  months?: (drinks and uses cocaine daily) How many times?: 1 Other Self Harm Risks: (homeless/minimal support/depression/ anxiety/unable to cope ) Triggers for Past Attempts: Spouse contact, Unpredictable Intentional Self Injurious Behavior: None Risk to Others: Homicidal Ideation: No Thoughts of Harm to Others: No Current Homicidal Intent: No Current Homicidal Plan: No Access to Homicidal Means: No Identified Victim: (none) History of harm to others?: No Assessment of Violence: None Noted Does patient have access to weapons?: No Criminal Charges Pending?: No Does patient have a court date: No Prior Inpatient Therapy: Prior Inpatient Therapy: Yes Prior Therapy Dates: (cannot remember, states that it has been years) Prior Therapy Facilty/Provider(s): (a treatment center in [place]) Reason for Treatment: (alcohol and drugs use / detox and tx) Prior Outpatient Therapy: Prior Outpatient Therapy: No Does patient have an ACCT team?: No Does patient have Intensive In-House Services?  : No Does patient have [facility] services? : No Does patient have P4CC services?: No  Past Medical History:  Past Medical History:  Diagnosis Date  . Alcohol abuse   . Bipolar affective disorder ([place])   . Depression   . Hard of hearing   . Heart murmur    per pt 12/28/15  . Hypertension     Past Surgical History:  Procedure Laterality Date  . HIP FRACTURE SURGERY Right    x 3  . KNEE SURGERY Right    Family History:  Family History  Problem Relation Age of Onset  . Other Mother        brain tumor behind left ear  . Alzheimer's disease Mother   . Skin cancer Father        melatomia  . Breast cancer Maternal Grandmother   . Heart disease Paternal  Uncle   . Heart disease Cousin        fathers side  . Colon cancer Neg Hx   . Esophageal cancer Neg Hx    Family Psychiatric  History: mother with [place] Social History:  Social History   Substance and Sexual Activity  Alcohol Use No  .  Frequency: Never   Comment: 5 forties daily, drank 2 today     Social History   Substance and Sexual Activity  Drug Use Yes  . Types: Cocaine   Comment: crack, heroin -snort last use 12/27/2017    Social History   Socioeconomic History  . Marital status: Single    Spouse name: Not on file  . Number of children: 0  . Years of education: Not on file  . Highest education level: Not on file  Occupational History  . Occupation: disabled  Social Needs  . Financial resource strain: Not on file  . Food insecurity:    Worry: Not on file    Inability: Not on file  . Transportation needs:    Medical: Not on file    Non-medical: Not on file  Tobacco Use  . Smoking status: Current Every Day Smoker    Packs/day: 1.00    Types: Cigarettes  . Smokeless tobacco: Never Used  Substance and Sexual Activity  . Alcohol use: No    Frequency: Never    Comment: 5 forties daily, drank 2 today  . Drug use: Yes    Types: Cocaine    Comment: crack, heroin -snort last use 12/27/2017  . Sexual activity: Yes    Birth control/protection: None  Lifestyle  . Physical activity:    Days per week: Not on file    Minutes per session: Not on file  . Stress: Not on file  Relationships  . Social connections:    Talks on phone: Not on file    Gets together: Not on file    Attends religious service: Not on file    Active member of club or organization: Not on file    Attends meetings of clubs or organizations: Not on file    Relationship status: Not on file  Other Topics Concern  . Not on file  Social History Narrative   Divorced, no children   Veteran of [employer] and a graduate of [employer] with a degree in radio TV and marketing formally worked in that [place]   Cocaine heroine and alcohol use.  States intention to quit currently started opiates after her first hip surgery   He is a smoker   11/23/2017      Additional Social History:    Allergies:   Allergies   Allergen Reactions  . Other Shortness Of Breath    Reaction to cats  . Apple Other (See Comments)    Raw apples cause gum swelling  . Cherry Other (See Comments)    Raw cherries cause gum swelling  . Lisinopril Hives    Possible reaction to lisinopril Possible reaction to lisinopril  . Penicillins Other (See Comments)    Allergic reaction as a 57 year old Has patient had a PCN reaction causing immediate rash, facial/tongue/throat swelling, SOB or lightheadedness with hypotension: no Has patient had a PCN reaction causing severe rash involving mucus membranes or skin necrosis: no Has patient had a PCN reaction that required hospitalization no Has patient had a PCN reaction occurring within the last 10 years: no If all of the above answers are "NO",  then may proceed with Cephalosporin use.  Marland Kitchen Plum Pulp Other (See Comments)    Raw plums cause gum swelling    Labs:  Results for orders placed or performed during the hospital encounter of 02/18/18 (from the past 48 hour(s))  Comprehensive metabolic panel     Status: Abnormal   Collection Time: 02/18/18  3:32 PM  Result Value Ref Range   Sodium 137 135 - 145 mmol/L   Potassium 3.5 3.5 - 5.1 mmol/L   Chloride 104 101 - 111 mmol/L   CO2 21 (L) 22 - 32 mmol/L   Glucose, Bld 105 (H) 65 - 99 mg/dL   BUN 13 6 - 20 mg/dL   Creatinine, Ser 0.60 (L) 0.61 - 1.24 mg/dL   Calcium 8.8 (L) 8.9 - 10.3 mg/dL   Total Protein 7.8 6.5 - 8.1 g/dL   Albumin 4.0 3.5 - 5.0 g/dL   AST 36 15 - 41 U/L   ALT 43 17 - 63 U/L   Alkaline Phosphatase 95 38 - 126 U/L   Total Bilirubin 0.3 0.3 - 1.2 mg/dL   GFR calc non Af Amer >60 >60 mL/min   GFR calc Af Amer >60 >60 mL/min    Comment: (NOTE) The eGFR has been calculated using the CKD EPI equation. This calculation has not been validated in all clinical situations. eGFR's persistently <60 mL/min signify possible Chronic Kidney Disease.    Anion gap 12 5 - 15    Comment: Performed at [facility], [place] [address], [place], [place] [address]  Ethanol     Status: Abnormal   Collection Time: 02/18/18  3:32 PM  Result Value Ref Range   Alcohol, Ethyl (B) 28 (H) <10 mg/dL    Comment:        LOWEST DETECTABLE LIMIT FOR SERUM ALCOHOL IS 10 mg/dL FOR MEDICAL PURPOSES ONLY Performed at [facility] [address], [place], [place] [address]   cbc     Status: Abnormal   Collection Time: 02/18/18  3:32 PM  Result Value Ref Range   WBC 13.6 (H) 4.0 - 10.5 K/uL   RBC 5.41 4.22 - 5.81 MIL/uL   Hemoglobin 16.8 13.0 - 17.0 g/dL   HCT 49.8 39.0 - 52.0 %   MCV 92.1 78.0 - 100.0 fL   MCH 31.1 26.0 - 34.0 pg   MCHC 33.7 30.0 - 36.0 g/dL   RDW 13.2 11.5 - 15.5 %   Platelets 239 150 - 400 K/uL    Comment: Performed at [facility], [place] [address] [place], [place] [address]  Rapid urine drug screen (hospital performed)     Status: Abnormal   Collection Time: 02/18/18  3:32 PM  Result Value Ref Range   Opiates NONE DETECTED NONE DETECTED   Cocaine POSITIVE (A) NONE DETECTED   Benzodiazepines NONE DETECTED NONE DETECTED   Amphetamines NONE DETECTED NONE DETECTED   Tetrahydrocannabinol NONE DETECTED NONE DETECTED   Barbiturates NONE DETECTED NONE DETECTED    Comment: (NOTE) DRUG SCREEN FOR MEDICAL PURPOSES ONLY.  IF CONFIRMATION IS NEEDED FOR ANY PURPOSE, NOTIFY LAB WITHIN 5 DAYS. LOWEST DETECTABLE LIMITS FOR URINE DRUG SCREEN Drug Class                     Cutoff (ng/mL) Amphetamine and metabolites    1000 Barbiturate and metabolites    200 Benzodiazepine  160 Tricyclics and metabolites     300 Opiates and metabolites        300 Cocaine and metabolites        300 THC                            50 Performed at [facility], [place] [address] [place], [place] [address]   Salicylate level     Status: None   Collection Time: 02/18/18  3:56 PM  Result Value Ref Range   Salicylate Lvl <6.2 2.8 - 30.0 mg/dL     Comment: Performed at [facility], [place] [address], [place], [place] [address]  Acetaminophen level     Status: Abnormal   Collection Time: 02/18/18  3:56 PM  Result Value Ref Range   Acetaminophen (Tylenol), Serum <10 (L) 10 - 30 ug/mL    Comment:        THERAPEUTIC CONCENTRATIONS VARY SIGNIFICANTLY. A RANGE OF 10-30 ug/mL MAY BE AN EFFECTIVE CONCENTRATION FOR MANY PATIENTS. HOWEVER, SOME ARE BEST TREATED AT CONCENTRATIONS OUTSIDE THIS RANGE. ACETAMINOPHEN CONCENTRATIONS >150 ug/mL AT 4 HOURS AFTER INGESTION AND >50 ug/mL AT 12 HOURS AFTER INGESTION ARE OFTEN ASSOCIATED WITH TOXIC REACTIONS. Performed at [facility], [place] [address], [place],  [address]     Current Facility-Administered Medications  Medication Dose Route Frequency Provider Last Rate Last Dose  . amLODipine (NORVASC) tablet 5 mg  5 mg Oral Daily Lacretia Leigh, MD   5 mg at 02/19/18 1124  . gabapentin (NEURONTIN) capsule 300 mg  300 mg Oral BID Jaleeyah Munce, MD   300 mg at 02/19/18 1126  . hydrOXYzine (ATARAX/VISTARIL) tablet 25 mg  25 mg Oral Q6H PRN Lacretia Leigh, MD      . loperamide (IMODIUM) capsule 2-4 mg  2-4 mg Oral PRN Lacretia Leigh, MD      . LORazepam (ATIVAN) tablet 1 mg  1 mg Oral Q6H PRN Patrecia Pour, NP      . LORazepam (ATIVAN) tablet 1 mg  1 mg Oral QID Patrecia Pour, NP   1 mg at 02/19/18 1126   Followed by  . [START ON 02/20/2018] LORazepam (ATIVAN) tablet 1 mg  1 mg Oral TID Patrecia Pour, NP       Followed by  . [START ON 02/21/2018] LORazepam (ATIVAN) tablet 1 mg  1 mg Oral BID Patrecia Pour, NP       Followed by  . [START ON 02/22/2018] LORazepam (ATIVAN) tablet 1 mg  1 mg Oral Daily Lord, Jamison Y, NP      . multivitamin with minerals tablet 1 tablet  1 tablet Oral Daily Lacretia Leigh, MD   1 tablet at 02/19/18 1126  . nicotine (NICODERM CQ - dosed in mg/24 hours) patch 21 mg  21 mg Transdermal Once Patrecia Pour, NP   21 mg  at 02/19/18 1129  . ondansetron (ZOFRAN-ODT) disintegrating tablet 4 mg  4 mg Oral Q6H PRN Lacretia Leigh, MD   4 mg at 02/18/18 1700  . potassium chloride SA (K-DUR,KLOR-CON) CR tablet 20 mEq  20 mEq Oral Daily Lacretia Leigh, MD   20 mEq at 02/19/18 1126  . propranolol ER (INDERAL LA) 24 hr capsule 80 mg  80 mg Oral Daily Lacretia Leigh, MD   80 mg at 02/19/18 1129  . thiamine (B-1) injection 100 mg  100 mg Intramuscular Once Lacretia Leigh, MD      .  thiamine (VITAMIN B-1) tablet 100 mg  100 mg Oral Daily Patrecia Pour, NP   100 mg at 02/19/18 1131  . traZODone (DESYREL) tablet 100 mg  100 mg Oral QHS Corena Pilgrim, MD       Current Outpatient Medications  Medication Sig Dispense Refill  . amLODipine (NORVASC) 5 MG tablet Take 5 mg by mouth daily.  1  . Potassium Chloride ER 20 MEQ TBCR Take 20 tablets by mouth daily. (Patient taking differently: Take 20 mEq by mouth daily. ) 30 tablet 1  . propranolol ER (INDERAL LA) 80 MG 24 hr capsule Take 80 mg by mouth daily.    Marland Kitchen LORazepam (ATIVAN) 1 MG tablet Take 1 tablet (1 mg total) by mouth See admin instructions. Take 2 mg (2 tablets) by mouth every 4 hours for 6 doses, then take 2 mg (2 tablets) by mouth every  6 hours for 4 doses, then 1 mg (1 tablet) every 6 hours for 8 additional doses (Patient not taking: Reported on 12/29/2017) 28 tablet 0    Musculoskeletal: Strength & Muscle Tone: within normal limits Gait & Station: normal Patient leans: N/A  Psychiatric Specialty Exam: Physical Exam  Constitutional: He is oriented to person, place, and time. He appears well-developed.  HENT:  Head: Normocephalic.  Cardiovascular: Normal rate and regular rhythm.  Respiratory: Effort normal and breath sounds normal.  Neurological: He is alert and oriented to person, place, and time.  Psychiatric: His speech is normal and behavior is normal. Judgment normal. Cognition and memory are normal. He exhibits a depressed mood. He expresses suicidal  ideation. He expresses suicidal plans.    ROS  Blood pressure (!) 144/86, pulse 63, temperature 98.4 F (36.9 C), temperature source Oral, resp. rate 18, SpO2 99 %.There is no height or weight on file to calculate BMI.  General Appearance: Casual  Eye Contact:  Fair  Speech:  Normal Rate  Volume:  Normal  Mood:  Anxious and Depressed  Affect:  Congruent  Thought Process:  Coherent and Descriptions of Associations: Intact  Orientation:  Full (Time, Place, and Person)  Thought Content:  Rumination  Suicidal Thoughts:  Yes.  with intent/plan  Homicidal Thoughts:  No  Memory:  Immediate;   Fair Recent;   Fair Remote;   Fair  Judgement:  Poor  Insight:  Fair  Psychomotor Activity:  Decreased  Concentration:  Concentration: Fair and Attention Span: Fair  Recall:  [employer] of Knowledge:  Fair  Language:  Good  Akathisia:  No  Handed:  Right  AIMS (if indicated):     Assets:  Leisure Time Physical Health Resilience Social Support  ADL's:  Intact  Cognition:  WNL  Sleep:        Treatment Plan Summary: Daily contact with patient to assess and evaluate symptoms and progress in treatment, Medication management and Plan major depressive disorder, recurrent, severe without psychosis:  -Crisis stabilization -Medication management:  Ativan alcohol detox protocol started along with gabapentin 300 mg BID for alcohol withdrawals, Trazodone 100 mg at bedtime for sleep and medical medications restarted. -Individual counseling  Disposition: Recommend psychiatric Inpatient admission when medically cleared.  Waylan Boga, NP 02/19/2018 12:08 PM  Patient seen face-to-face for psychiatric evaluation, chart reviewed and case discussed with the physician extender and developed treatment plan. Reviewed the information documented and agree with the treatment plan. Corena Pilgrim, MD

## 2018-02-19 NOTE — Patient Outreach (Signed)
ED Peer Support Specialist Patient Intake (Complete at intake & 30-60 Day Follow-up)  Name: Steven Daniels  MRN: [id number]  Age: 57 y.o.   Date of Admission: 02/19/2018  Intake: Initial Comments:      Primary Reason Admitted: male who presented in [place] seeking help for his depression and his alcohol and drug use.  Patient states that he has been diagnosed with bipolar disorder and hepatitis C.  Patient states that he drinks to self-medicate his bipolar disorder and states that he knows that his drinking is killing his liver.  He states that he has been so depressed that he states that he does not care if he dies and states that he has been trying to drink himself to death.  Patient states that he purposely injected too much heroin two months ago in a suicide attempt, but if failed to kill him. Patient states that it is against his religion to commit suicide, but if he overdoses on drugs or alcohol, he does not consider that to be suicide. Patient states that he has been off his bipolar medications for years.  Patient states that because of his drinking and using that he has alienated himself from his family and interpersonal relationships and he states that he is tired of living this way and wants to make changes in his life to have the support he needs to make it through life.  He states that he has been missing out on too much. He denies current HI and Psychosis.    Lab values: Alcohol/ETOH: Positive Positive UDS? 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 No  Number of ED visits:    Reason(s) for visit:    In past year, has patient been hospitalized for any reason? No  Number of hospitalizations:    Reason(s) for hospitalization:    In past year, has patient been arrested? No  Number of arrests:    Reason(s) for arrest:    In past year, has patient been incarcerated?    Number of incarcerations:    Reason(s) for incarceration:    In past year, has patient received medication-assisted treatment? No  In past year, patient received the following treatments: Residential treatment (non-hospital)  In past year, has patient received any harm reduction services? No  Did this include any of the following?    In past year, has patient received care from a mental health provider for diagnosis other than SUD? No  In past year, is this first time patient has overdosed? No  Number of past overdoses:    In past year, is this first time patient has been hospitalized for an overdose? No  Number of hospitalizations for overdose(s):    Is patient currently receiving treatment for a mental health diagnosis? No  Patient reports experiencing difficulty participating in SUD treatment: No    Most important reason(s) for this difficulty?    Has patient received prior services for treatment? No  In past, patient has received services from following agencies:    Plan of Care:  Suggested follow up at these agencies/treatment centers: [facility]),  [facility]Wants to get medications stable at and get himself clean. )  Other information: [facility]PSS met with Pt and was able to discuss with Pt his concerns and issues that he was dealing with. CPSS talked with Pt about what lead up to the issues he was having. CPSS made Pt aware that there is HOPE and to continue to work on bettering the quality of his life. CPSS made Pt aware that after  he gets out of [facility] CPSS will follow up with Pt to work on getting him into a community based facility.    Aaron Edelman Miko Markwood, [place]  02/19/2018 11:27 AM

## 2018-02-19 NOTE — ED Notes (Signed)
SBAR Report received from previous nurse. Pt received calm and visible on unit. Pt denies current SI/ HI, A/V H, depression, anxiety, or pain at this time, and appears otherwise stable and free of distress. Pt reminded of camera surveillance, q 15 min rounds, and rules of the milieu. Will continue to assess. 

## 2018-02-19 NOTE — BHH Counselor (Addendum)
Pt has been [facility] by T[facility] End[facility]Kelly, [facility] and assigned to room/bed: 306-2. Pt can come after 9:45pm. Support paperwork has been completed and faxed.    Steven Pullingreylese D Ebone Alcivar, MS, [facility], [facility] Triage Specialist [phone]

## 2018-02-19 NOTE — BH Assessment (Signed)
[facility]     Per Dr Jannifer FranklinAkintayo, [facility].

## 2018-02-19 NOTE — ED Notes (Signed)
All belongings given to [place] driver.

## 2018-02-19 NOTE — ED Notes (Signed)
C/O anxiety.  PRN medication requested and received. 

## 2018-02-20 ENCOUNTER — Encounter (HOSPITAL_COMMUNITY): Payer: Self-pay | Admitting: *Deleted

## 2018-02-20 ENCOUNTER — Other Ambulatory Visit: Payer: Self-pay

## 2018-02-20 DIAGNOSIS — Z62810 Personal history of physical and sexual abuse in childhood: Secondary | ICD-10-CM

## 2018-02-20 DIAGNOSIS — B182 Chronic viral hepatitis C: Secondary | ICD-10-CM

## 2018-02-20 DIAGNOSIS — Z62811 Personal history of psychological abuse in childhood: Secondary | ICD-10-CM

## 2018-02-20 LAB — HEMOGLOBIN A1C
HEMOGLOBIN A1C: 5.6 % (ref 4.8–5.6)
MEAN PLASMA GLUCOSE: 114.02 mg/dL

## 2018-02-20 LAB — LIPID PANEL
Cholesterol: 192 mg/dL (ref 0–200)
HDL: 79 mg/dL (ref >40)
LDL CALC: 85 mg/dL (ref 0–99)
Total CHOL/HDL Ratio: 2.4 RATIO
Triglycerides: 142 mg/dL (ref <150)
VLDL: 28 mg/dL (ref 0–40)

## 2018-02-20 LAB — TSH: TSH: 1.169 u[IU]/mL (ref 0.350–4.500)

## 2018-02-20 MED ORDER — NICOTINE 21 MG/24HR TD PT24
21.0000 mg | MEDICATED_PATCH | Freq: Every day | TRANSDERMAL | Status: DC
  Administered 2018-02-20 – 2018-02-28 (×9): 21 mg via TRANSDERMAL
  Filled 2018-02-20 (×13): qty 1

## 2018-02-20 MED ORDER — GABAPENTIN 100 MG PO CAPS
200.0000 mg | ORAL_CAPSULE | Freq: Two times a day (BID) | ORAL | Status: DC
  Administered 2018-02-20 – 2018-02-25 (×11): 200 mg via ORAL
  Filled 2018-02-20 (×14): qty 2

## 2018-02-20 MED ORDER — IBUPROFEN 600 MG PO TABS
600.0000 mg | ORAL_TABLET | Freq: Four times a day (QID) | ORAL | Status: DC | PRN
  Administered 2018-02-21 – 2018-02-28 (×9): 600 mg via ORAL
  Filled 2018-02-20 (×9): qty 1

## 2018-02-20 MED ORDER — FLUOXETINE HCL 10 MG PO CAPS
10.0000 mg | ORAL_CAPSULE | Freq: Every day | ORAL | Status: DC
  Administered 2018-02-20 – 2018-02-22 (×3): 10 mg via ORAL
  Filled 2018-02-20 (×7): qty 1

## 2018-02-20 MED ORDER — ENSURE ENLIVE PO LIQD: 237.0000 mL | Freq: Two times a day (BID) | ORAL | Status: DC

## 2018-02-20 MED ORDER — LORAZEPAM 1 MG PO TABS
1.0000 mg | ORAL_TABLET | Freq: Once | ORAL | Status: AC
  Administered 2018-02-20: 1 mg via ORAL

## 2018-02-20 MED ORDER — ONDANSETRON 4 MG PO TBDP
4.0000 mg | ORAL_TABLET | Freq: Once | ORAL | Status: AC
  Administered 2018-02-20: 4 mg via ORAL
  Filled 2018-02-20: qty 1

## 2018-02-20 MED ORDER — ENSURE ENLIVE PO LIQD: 237.0000 mL | Freq: Every day | ORAL | Status: DC | PRN
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
## 2018-02-20 NOTE — Tx Team (Signed)
Initial Treatment Plan 02/20/2018 12:28 AM Steven Daniels BJY:[id number]    PATIENT STRESSORS: Financial difficulties Health problems Loss of father and friend Substance abuse   PATIENT STRENGTHS: Ability for insight Active sense of humor Average or above average intelligence Communication skills General fund of knowledge Motivation for treatment/growth   PATIENT IDENTIFIED PROBLEMS: SI  Alcohol and cocaine abuse    "Get balanced on my Bipolar medications while I'm here and then go to long-term treatment after"    Coping skills           DISCHARGE CRITERIA:  Improved stabilization in mood, thinking, and/or behavior Withdrawal symptoms are absent or subacute and managed without 24-hour nursing intervention  PRELIMINARY DISCHARGE PLAN: Attend PHP/IOP Attend 12-step recovery group  PATIENT/FAMILY INVOLVEMENT: This treatment plan has been presented to and reviewed with the patient, SLM CorporationClay Daniels.  The patient and family have been given the opportunity to ask questions and make suggestions.  Steven Daniels, Steven Daniels, [place]RN 02/20/2018, 12:28 AM

## 2018-02-20 NOTE — Progress Notes (Signed)
Pt did not attend group this evening.  

## 2018-02-20 NOTE — Progress Notes (Signed)
D   Pt is pleasant on approach and cooperative    He reports having some mild to moderate withdrawal symptoms    He interacts appropriately with others and contracts for safety   Pt did not attend group tonight   Pt complained of some nausea A   Verbal support given    Medications administered and effectiveness monitored   q 15 min checks  R   Pt is safe at present and receptive to verbal support

## 2018-02-20 NOTE — Progress Notes (Signed)
NUTRITION ASSESSMENT  Pt identified as at risk on the Malnutrition Screen Tool  INTERVENTION: 1. Educated patient on the importance of nutrition and encouraged intake of food and beverages. 2. Discussed weight goals. 3. Supplements:  - will change Ensure Enlive once/day PRN, this supplement provides 350 kcal and 20 grams of protein - will order daily multivitamin with minerals.   NUTRITION DIAGNOSIS: Unintentional weight loss related to sub-optimal intake as evidenced by pt report.   Goal: Pt to meet >/= 90% of their estimated nutrition needs.  Monitor:  PO intake  Assessment:  Pt admitted for depression, alcohol abuse, and drug use. Pt reported hx of bipolar disorder and hepatitis C and that he drinks to self-medicate.   Per review, pt has lost 6 lbs (2.7 % body weight) in the past 2 months. This is not significant for time frame. Continue to encourage PO intakes of meals and snacks.   57 y.o. male  Height: Ht Readings from Last 1 Encounters:  02/19/18  (1.88 m)    Weight: Wt Readings from Last 1 Encounters:  02/19/18 215 lb (97.5 kg)    Weight Hx: Wt Readings from Last 10 Encounters:  02/19/18 215 lb (97.5 kg)  12/29/17 221 lb (100.2 kg)  11/23/17 221 lb (100.2 kg)  09/03/17 215 lb (97.5 kg)  04/06/17 215 lb (97.5 kg)  01/20/17 220 lb (99.8 kg)  12/28/15 226 lb (102.5 kg)  12/27/15 231 lb 8 oz (105 kg)    BMI:  Body mass index is 27.6 kg/m. Pt meets criteria for overweight based on current BMI.  Estimated Nutritional Needs: Kcal: 25-30 kcal/kg Protein: > 1 gram protein/kg Fluid: 1 ml/kcal  Diet Order: Diet regular Room service appropriate? Yes; Fluid consistency: Thin Pt is also offered choice of unit snacks mid-morning and mid-afternoon.  Pt is eating as desired.   Lab results and medications reviewed.      Trenton Gammon, MS, RD, LDN, [facility] Inpatient Clinical Dietitian Pager # [phone] After hours/weekend pager # [phone]

## 2018-02-20 NOTE — Progress Notes (Signed)
Nursing Progress Note [phone]  Data: Patient presents with pleasant mood and decreasing withdrawal symptoms. Patient does report excessive sweating, nausea and anxiety this morning but reports his symptoms have decreased. Patient was observed sleeping in bed this morning but is now more visible in the milieu. Patient is taking medications without incident. Patient denies SI/HI/AVH or pain. Patient contracts for safety on the unit at this time. Patient provided but declined to complete their self-inventory sheet.  Action: Patient educated about and provided medication per provider's orders. Patient safety maintained with q15 min safety checks. Low fall risk precautions in place. Emotional support given. 1:1 interaction and active listening provided. Patient encouraged to attend meals and groups. Labs, vital signs and patient behavior monitored throughout shift. Patient encouraged to work on treatment plan and goals.  Response: Patient receptive to interaction with nurse. Patient remains safe on the unit at this time. Patient is interacting appropriately in the milieu. Will continue to support and monitor.

## 2018-02-20 NOTE — Progress Notes (Signed)
Recreation Therapy Notes  Date: 3.25.19 Time: 9:30 a.m. Location: [address] Dayroom   Group Topic: Stress Management   Goal Area(s) Addresses:  Goal 1.1: To reduce stress  -Patient will report feeling a reduction in stress level  -Patient will identify the importance of stress management  -Patient will participate during stress management group treatment     Intervention: Stress Management   Activity: Meditattion- Patients were in a peaceful environment with soft lighting enhancing patients mood. Patients listened to a deep concentration voice over to decrease stress levels   Education: Stress Management, Discharge Planning.    Education Outcome: Acknowledges edcuation/In group clarification offered/Needs additional education   Clinical Observations/Feedback:: Patient did not attend     Kenya Shiraishi, Recreation Therapy Intern   Patrick Sohm 02/20/2018 8:31 AM 

## 2018-02-20 NOTE — [facility] Suicide Risk Assessment (Signed)
[facility] INPATIENT:  Family/Significant Other Suicide Prevention Education  Suicide Prevention Education:  Education Completed; Steven Daniels, girlfriend [phone]) has been identified by the patient as the family member/significant other with whom the patient will be residing, and identified as the person(s) who will aid the patient in the event of a mental health crisis (suicidal ideations/suicide attempt).  With written consent from the patient, the family member/significant other has been provided the following suicide prevention education, prior to the and/or following the discharge of the patient.  The suicide prevention education provided includes the following:  Suicide risk factors  Suicide prevention and interventions  National Suicide Hotline telephone number  [facility] assessment telephone number  [facility] Emergency Assistance 911  [facility] and/or Residential Mobile Crisis Unit telephone number  Request made of family/significant other to:  Remove weapons (e.g., guns, rifles, knives), all items previously/currently identified as safety concern.    Remove drugs/medications (over-the-counter, prescriptions, illicit drugs), all items previously/currently identified as a safety concern.  The family member/significant other verbalizes understanding of the suicide prevention education information provided.  The family member/significant other agrees to remove the items of safety concern listed above.  Steven Daniels 02/20/2018, 3:44 PM

## 2018-02-20 NOTE — Progress Notes (Signed)
Pt exhibits withdrawal symptoms of sweats, anxiety, tremors, nausea.  His face is reddened and sweaty.  It was not yet time for him to be able to get PRN medication for CIWA over 10 and nausea.  CIWA score is 12.  On-site provider notified and Ativan 1 mg POX1 and zofran 4 mg X1 ordered and administered (see eMAR).   Pt also complains of diarrhea and PRN medication was administered.  Pt informed he will be getting another Ativan with morning medication pass and he verbalized understanding.  Encouraged pt to report his withdrawal symptoms to oncoming nurse as they occur and he reports he will.  He was cooperative with medication administration and he denies further needs and concerns at this time.  He returned to his room and he will have a meal tray brought back for him for breakfast.
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
## 2018-02-20 NOTE — Plan of Care (Signed)
  Problem: Safety: Goal: Periods of time without injury will increase Outcome: Progressing   Problem: Physical Regulation: Goal: Complications related to the disease process, condition or treatment will be avoided or minimized Outcome: Progressing  Comment: Patient support through withdrawal with use of scheduled and PRN medications. Patient remains safe at this time with CIWA score being maintained. Patient contracts for safety on the unit.

## 2018-02-20 NOTE — Tx Team (Signed)
Interdisciplinary Treatment and Diagnostic Plan Update  02/20/2018 Time of Session: 9:50am Steven Daniels MRN: [id number]  Principal Diagnosis: <principal problem not specified>  Secondary Diagnoses: Active Problems:   Bipolar 1 disorder, depressed, severe (HCC)   Current Medications:  Current Facility-Administered Medications  Medication Dose Route Frequency Provider Last Rate Last Dose  . alum & mag hydroxide-simeth (MAALOX/MYLANTA) 200-200-20 MG/5ML suspension 30 mL  30 mL Oral Q4H PRN Lindon Romp A, NP      . amLODipine (NORVASC) tablet 5 mg  5 mg Oral Daily Lindon Romp A, NP   5 mg at 02/20/18 0803  . feeding supplement (ENSURE ENLIVE) (ENSURE ENLIVE) liquid 237 mL  237 mL Oral Daily PRN Cobos, Fernando A, MD      . gabapentin (NEURONTIN) capsule 200 mg  200 mg Oral BID Derrill Center, NP      . hydrOXYzine (ATARAX/VISTARIL) tablet 25 mg  25 mg Oral Q6H PRN Lindon Romp A, NP      . ibuprofen (ADVIL,MOTRIN) tablet 600 mg  600 mg Oral Q6H PRN Lindon Romp A, NP      . loperamide (IMODIUM) capsule 2-4 mg  2-4 mg Oral PRN Lindon Romp A, NP   4 mg at 02/20/18 0700  . LORazepam (ATIVAN) tablet 1 mg  1 mg Oral QID Lindon Romp A, NP   1 mg at 02/20/18 0804   Followed by  . [START ON 02/21/2018] LORazepam (ATIVAN) tablet 1 mg  1 mg Oral TID Rozetta Nunnery, NP       Followed by  . [START ON 02/22/2018] LORazepam (ATIVAN) tablet 1 mg  1 mg Oral BID Rozetta Nunnery, NP       Followed by  . [START ON 02/24/2018] LORazepam (ATIVAN) tablet 1 mg  1 mg Oral Daily Lindon Romp A, NP      . LORazepam (ATIVAN) tablet 1 mg  1 mg Oral Q6H PRN Lindon Romp A, NP   1 mg at 02/20/18 0221  . magnesium hydroxide (MILK OF MAGNESIA) suspension 30 mL  30 mL Oral Daily PRN Lindon Romp A, NP      . multivitamin with minerals tablet 1 tablet  1 tablet Oral Daily Lindon Romp A, NP   1 tablet at 02/20/18 0804  . nicotine (NICODERM CQ - dosed in mg/24 hours) patch 21 mg  21 mg Transdermal Daily Cobos, Myer Peer, MD   21 mg at 02/20/18 0804  . ondansetron (ZOFRAN-ODT) disintegrating tablet 4 mg  4 mg Oral Q6H PRN Lindon Romp A, NP   4 mg at 02/20/18 0221  . potassium chloride SA (K-DUR,KLOR-CON) CR tablet 20 mEq  20 mEq Oral Daily Lindon Romp A, NP   20 mEq at 02/20/18 0804  . propranolol ER (INDERAL LA) 24 hr capsule 80 mg  80 mg Oral Daily Lindon Romp A, NP   80 mg at 02/20/18 0803  . thiamine (VITAMIN B-1) tablet 100 mg  100 mg Oral Daily Lindon Romp A, NP   100 mg at 02/20/18 0804  . traZODone (DESYREL) tablet 50 mg  50 mg Oral QHS PRN Lindon Romp A, NP   50 mg at 02/20/18 0004   PTA Medications: Medications Prior to Admission  Medication Sig Dispense Refill Last Dose  . amLODipine (NORVASC) 5 MG tablet Take 5 mg by mouth daily.  1 Past Month at Unknown time  . LORazepam (ATIVAN) 1 MG tablet Take 1 tablet (1 mg total) by mouth See admin instructions. Take 2  mg (2 tablets) by mouth every 4 hours for 6 doses, then take 2 mg (2 tablets) by mouth every  6 hours for 4 doses, then 1 mg (1 tablet) every 6 hours for 8 additional doses (Patient not taking: Reported on 12/29/2017) 28 tablet 0 Not Taking at Unknown time  . Potassium Chloride ER 20 MEQ TBCR Take 20 tablets by mouth daily. (Patient taking differently: Take 20 mEq by mouth daily. ) 30 tablet 1 Past Month at Unknown time  . propranolol ER (INDERAL LA) 80 MG 24 hr capsule Take 80 mg by mouth daily.   Past Month at Unknown time    Patient Stressors: Financial difficulties Health problems Loss of father and friend Substance abuse  Patient Strengths: Ability for insight Active sense of humor Average or above average intelligence Communication skills General fund of knowledge Motivation for treatment/growth  Treatment Modalities: Medication Management, Group therapy, Case management,  1 to 1 session with clinician, Psychoeducation, Recreational therapy.   Physician Treatment Plan for Primary Diagnosis: <principal problem not  specified>  Medication Management: Evaluate patient's response, side effects, and tolerance of medication regimen.  Therapeutic Interventions: 1 to 1 sessions, Unit Group sessions and Medication administration.  Evaluation of Outcomes: Not Met  Physician Treatment Plan for Secondary Diagnosis: Active Problems:   Bipolar 1 disorder, depressed, severe ([place])   Medication Management: Evaluate patient's response, side effects, and tolerance of medication regimen.  Therapeutic Interventions: 1 to 1 sessions, Unit Group sessions and Medication administration.  Evaluation of Outcomes: Not Met   RN Treatment Plan for Primary Diagnosis: <principal problem not specified> Long Term Goal(s): Knowledge of disease and therapeutic regimen to maintain health will improve  Short Term Goals: Ability to remain free from injury will improve, Ability to verbalize frustration and anger appropriately will improve, Ability to demonstrate self-control, Ability to participate in decision making will improve, Ability to verbalize feelings will improve, Ability to disclose and discuss suicidal ideas, Ability to identify and develop effective coping behaviors will improve and Compliance with prescribed medications will improve  Medication Management: RN will administer medications as ordered by provider, will assess and evaluate patient's response and provide education to patient for prescribed medication. RN will report any adverse and/or side effects to prescribing provider.  Therapeutic Interventions: 1 on 1 counseling sessions, Psychoeducation, Medication administration, Evaluate responses to treatment, Monitor vital signs and CBGs as ordered, Perform/monitor CIWA, COWS, AIMS and Fall Risk screenings as ordered, Perform wound care treatments as ordered.  Evaluation of Outcomes: Not Met   LCSW Treatment Plan for Primary Diagnosis: <principal problem not specified> Long Term Goal(s): Safe transition to  appropriate next level of care at discharge, Engage patient in therapeutic group addressing interpersonal concerns.  Short Term Goals: Engage patient in aftercare planning with referrals and resources, Increase social support, Increase ability to appropriately verbalize feelings, Increase emotional regulation, Facilitate acceptance of mental health diagnosis and concerns, Facilitate patient progression through stages of change regarding substance use diagnoses and concerns, Identify triggers associated with mental health/substance abuse issues and Increase skills for wellness and recovery  Therapeutic Interventions: Assess for all discharge needs, 1 to 1 time with Social worker, Explore available resources and support systems, Assess for adequacy in community support network, Educate family and significant other(s) on suicide prevention, Complete Psychosocial Assessment, Interpersonal group therapy.  Evaluation of Outcomes: Not Met   Progress in Treatment: Attending groups: No. New to the unit Participating in groups: No. Taking medication as prescribed: Yes. Toleration medication: Yes. Family/Significant other  contact made: No, will contact:  CSW will continue to assess Patient understands diagnosis: Yes. Discussing patient identified problems/goals with staff: Yes. Medical problems stabilized or resolved: Yes. Denies suicidal/homicidal ideation: Yes. Issues/concerns per patient self-inventory: No. Other:   New problem(s) identified: None  New Short Term/Long Term Goal(s): medication stabilization, elimination of SI thoughts, development of comprehensive mental wellness plan.    Patient Goals: " I want to get balanced on meds"   Discharge Plan or Barriers: CSW will continue to assess for an appropriate discharge plan.   Reason for Continuation of Hospitalization: Anxiety Depression Suicidal ideation Withdrawal symptoms  Estimated Length of Stay: Wednesday,  02/22/18  Attendees: Patient: Steven Daniels  02/20/2018 10:04 AM  Physician: Dr. Neita Garnet  02/20/2018 10:04 AM  Nursing: Estill Bamberg, RN  02/20/2018 10:04 AM  RN Care Manager: Rhunette Croft 02/20/2018 10:04 AM  Social Worker: Radonna Ricker, [place] 02/20/2018 10:04 AM  Recreational Therapist: X  02/20/2018 10:04 AM  Other: x 02/20/2018 10:04 AM  Other: x 02/20/2018 10:04 AM  Othe :x  02/20/2018 10:04 AM    Scribe for Treatment Team: Marylee Floras, [place] 02/20/2018 10:04 AM

## 2018-02-21 DIAGNOSIS — R45851 Suicidal ideations: Secondary | ICD-10-CM

## 2018-02-21 DIAGNOSIS — Z813 Family history of other psychoactive substance abuse and dependence: Secondary | ICD-10-CM

## 2018-02-21 DIAGNOSIS — Z915 Personal history of self-harm: Secondary | ICD-10-CM

## 2018-02-21 DIAGNOSIS — Z811 Family history of alcohol abuse and dependence: Secondary | ICD-10-CM

## 2018-02-21 DIAGNOSIS — F314 Bipolar disorder, current episode depressed, severe, without psychotic features: Principal | ICD-10-CM

## 2018-02-21 DIAGNOSIS — F17210 Nicotine dependence, cigarettes, uncomplicated: Secondary | ICD-10-CM

## 2018-02-21 DIAGNOSIS — Z810 Family history of intellectual disabilities: Secondary | ICD-10-CM

## 2018-02-21 DIAGNOSIS — F1099 Alcohol use, unspecified with unspecified alcohol-induced disorder: Secondary | ICD-10-CM

## 2018-02-21 DIAGNOSIS — Z818 Family history of other mental and behavioral disorders: Secondary | ICD-10-CM

## 2018-02-21 DIAGNOSIS — F1910 Other psychoactive substance abuse, uncomplicated: Secondary | ICD-10-CM

## 2018-02-21 NOTE — [facility] Group Notes (Signed)
[facility] Group Notes:  (Nursing/MHT/Case Management/Adjunct)  Date:  02/21/2018  Time:  3:15 pm  Type of Therapy:  Group Therapy  Participation Level:  Did Not Attend  Participation Quality:    Affect:    Cognitive:    Insight:    Engagement in Group:    Modes of Intervention:    Summary of Progress/Problems:  Steven Daniels 02/21/2018, 7:55 PM

## 2018-02-21 NOTE — Progress Notes (Signed)
DAR NOTE: Patient presents with anxious affect and depressed mood. Pt stated he is still withdrawing from substance, pt observed sweating and having shakes and complained of generalized aches  Denies auditory and visual hallucinations.  Rates depression at 8, hopelessness at 6, and anxiety at 3.  Maintained on routine safety checks.  Medications given as prescribed.  Support and encouragement offered as needed.  Attended group and participated.  States goal for today is "sleep."  Patient observed socializing with peers in the dayroom.  Offered no complaint.

## 2018-02-21 NOTE — Progress Notes (Signed)
Pt did not attend morning orientation/goals group. 

## 2018-02-21 NOTE — Progress Notes (Signed)
  DATA ACTION RESPONSE  Objective- Pt. is visible in the dayroom, seen eating a snack.Presents with a animated/anxious affect and mood. Pt was isolative to room, seen sleeping for majority of shift. C/o of withdrawal s/s and anxiety this evening. See CIWA. Pt states trazodone gives him nightmares.  Subjective- Denies having any SI/HI/AVH/Pain at this time. Is cooperative and remains safe on the unit.  1:1 interaction in private to establish rapport. Encouragement, education, & support given from staff.  PRN vistaril and ativan  requested and will re-eval accordingly.   Safety maintained with Q 15 checks. Continue with POC.

## 2018-02-21 NOTE — [facility] Group Notes (Signed)
[facility]      02/21/2018 3:08 PM  Type of Therapy: Mental Health Association Presentation  Participation Level: DID NOT ATTEND  Participation Quality:   Affect:   Cognitive:   Insight:   Engagement in Therapy:  Modes of Intervention: Discussion, Education and Socialization  Summary of Progress/Problems:    DID NOT ATTEND   Lawrance Wiedemann Catalina AntiguaWilliams LCSWA Clinical Social Worker

## 2018-02-21 NOTE — Progress Notes (Signed)
[facility] MD Progress Note  02/21/2018 4:48 PM Steven Daniels  MRN:  [id number] Subjective: Patient is seen, examined and key elements reviewed.  Patient was discussed with treatment team including social work and nursing.  Patient is a 57 year old male with a past psychiatric history significant for bipolar disorder and alcohol use disorder.  He was admitted on 02/20/2018.  He complained of depression, alcohol use disorder, and wanting to "drink himself to death".  The patient stated on admission that he purposely injected heroin 2 months ago and his suicide attempt but it did not kill him.  He does consider himself to be suicidal.  He stated he had been off his medications for "a while".  He also stated that he desires to get into a rehabilitation facility.  He stated he was a little sedated today.  He stated he slept better last night.  He does have some psychomotor agitation.  He denied any current suicidal thoughts.  He denied any side effects to his current medications. Principal Problem: Bipolar 1 disorder, depressed, severe (HCC) Diagnosis:   Patient Active Problem List   Diagnosis Date Noted  . Alcohol abuse with alcohol-induced mood disorder (HCC) [F10.14] 02/19/2018  . Bipolar 1 disorder, depressed, severe (HCC) [F31.4] 02/19/2018  . Chronic hepatitis C without hepatic coma (HCC) [B18.2] 12/29/2017  . Polysubstance abuse (HCC) [F19.10] 12/29/2017  . IVDU (intravenous drug user) [F19.90] 12/29/2017  . Chronic pain [G89.29] 12/29/2017  . Cigarette smoker [F17.210] 12/29/2017  . Hearing loss [H91.90] 12/29/2017  . 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 Alcohol dependence with withdrawal, uncomplicated (HCC) [F10.230] 12/29/2015  . Severe recurrent major depression without psychotic features (HCC) [F33.2] 12/29/2015   Total Time spent with patient: 20 minutes  Past Psychiatric History: Bipolar disorder, most recently depressed, alcohol use disorder, opiate use  disorder.  Past Medical History:  Past Medical History:  Diagnosis Date  . Alcohol abuse   . Bipolar affective disorder (HCC)   . Depression   . Hard of hearing   . Heart murmur    per pt 12/28/15  . Hypertension     Past Surgical History:  Procedure Laterality Date  . HIP FRACTURE SURGERY Right    x 3  . KNEE SURGERY Right    Family History:  Family History  Problem Relation Age of Onset  . Other Mother        brain tumor behind left ear  . Alzheimer's disease Mother   . Skin cancer Father        melatomia  . Breast cancer Maternal Grandmother   . Heart disease Paternal Uncle   . Heart disease Cousin        fathers side  . Colon cancer Neg Hx   . Esophageal cancer Neg Hx    Family Psychiatric  History: Significant for substance abuse and schizophrenia. Social History:  Social History   Substance and Sexual Activity  Alcohol Use No  . Frequency: Never   Comment: 5 forties daily, drank 2 today     Social History   Substance and Sexual Activity  Drug Use Yes  . Types: Cocaine   Comment: crack, heroin -snort last use 12/27/2017    Social History   Socioeconomic History  . Marital status: Single    Spouse name: Not on file  . Number of children: 0  . Years of education: Not on file  . Highest education level: Not on file  Occupational History  . Occupation: disabled  Social  Needs  . Financial resource strain: Not on file  . Food insecurity:    Worry: Not on file    Inability: Not on file  . Transportation needs:    Medical: Not on file    Non-medical: Not on file  Tobacco Use  . Smoking status: Current Every Day Smoker    Packs/day: 1.00    Types: Cigarettes  . Smokeless tobacco: Never Used  Substance and Sexual Activity  . Alcohol use: No    Frequency: Never    Comment: 5 forties daily, drank 2 today  . Drug use: Yes    Types: Cocaine    Comment: crack, heroin -snort last use 12/27/2017  . Sexual activity: Not Currently    Birth  control/protection: None  Lifestyle  . Physical activity:    Days per week: Not on file    Minutes per session: Not on file  . Stress: Not on file  Relationships  . Social connections:    Talks on phone: Not on file    Gets together: Not on file    Attends religious service: Not on file    Active member of club or organization: Not on file    Attends meetings of clubs or organizations: Not on file    Relationship status: Not on file  Other Topics Concern  . Not on file  Social History Narrative   Divorced, no children   Veteran of 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  States intention to quit currently started opiates after her first hip surgery   He is a smoker   11/23/2017      Additional Social History:    Pain Medications: per report has history of abusing pain pills Prescriptions: denies Over the Counter: denies History of alcohol / drug use?: Yes Longest period of sobriety (when/how long): unsure Negative Consequences of Use: Financial, Personal relationships Withdrawal Symptoms: Sweats, Tremors Onset of Seizures: unknown Date of most recent seizure: unknown Name of Substance 1: alcohol 1 - Age of First Use: 18 1 - Amount (size/oz): (5) forty ounce beers 1 - Frequency: daily 1 - Duration: ongoing 1 - Last Use / Amount: yesterday Name of Substance 2: cocain 2 - Age of First Use: unsure 2 - Amount (size/oz): approximately $200 worth 2 - Frequency: daily  2 - Duration: on-going 2 - Last Use / Amount: yesterday                Sleep: Fair  Appetite:  Good  Current Medications: Current Facility-Administered Medications  Medication Dose Route Frequency Provider Last Rate Last Dose  . alum & mag hydroxide-simeth (MAALOX/MYLANTA) 200-200-20 MG/5ML suspension 30 mL  30 mL Oral Q4H PRN Nira Conn A, NP      . amLODipine (NORVASC)  tablet 5 mg  5 mg Oral Daily Nira Conn A, NP   5 mg at 02/21/18 0752  . feeding supplement (ENSURE ENLIVE) (ENSURE ENLIVE) liquid 237 mL  237 mL Oral Daily PRN Cobos, Fernando A, MD      . FLUoxetine (PROZAC) capsule 10 mg  10 mg Oral Daily Cobos, Rockey Situ, MD   10 mg at 02/21/18 0753  . gabapentin (NEURONTIN) capsule 200 mg  200 mg Oral BID Oneta Rack, NP   200 mg at 02/21/18 0752  . hydrOXYzine (ATARAX/VISTARIL) tablet 25 mg  25 mg Oral Q6H  PRN Jackelyn Poling, NP   25 mg at 02/20/18 2127  . ibuprofen (ADVIL,MOTRIN) tablet 600 mg  600 mg Oral Q6H PRN Nira Conn A, NP   600 mg at 02/21/18 0753  . loperamide (IMODIUM) capsule 2-4 mg  2-4 mg Oral PRN Nira Conn A, NP   4 mg at 02/20/18 0700  . LORazepam (ATIVAN) tablet 1 mg  1 mg Oral TID Nira Conn A, NP   1 mg at 02/21/18 1157   Followed by  . [START ON 02/22/2018] LORazepam (ATIVAN) tablet 1 mg  1 mg Oral BID Jackelyn Poling, NP       Followed by  . [START ON 02/24/2018] LORazepam (ATIVAN) tablet 1 mg  1 mg Oral Daily Nira Conn A, NP      . LORazepam (ATIVAN) tablet 1 mg  1 mg Oral Q6H PRN Nira Conn A, NP   1 mg at 02/20/18 2127  . magnesium hydroxide (MILK OF MAGNESIA) suspension 30 mL  30 mL Oral Daily PRN Nira Conn A, NP      . multivitamin with minerals tablet 1 tablet  1 tablet Oral Daily Nira Conn A, NP   1 tablet at 02/21/18 0753  . nicotine (NICODERM CQ - dosed in mg/24 hours) patch 21 mg  21 mg Transdermal Daily Cobos, Rockey Situ, MD   21 mg at 02/21/18 0753  . ondansetron (ZOFRAN-ODT) disintegrating tablet 4 mg  4 mg Oral Q6H PRN Nira Conn A, NP   4 mg at 02/20/18 2126  . potassium chloride SA (K-DUR,KLOR-CON) CR tablet 20 mEq  20 mEq Oral Daily Nira Conn A, NP   20 mEq at 02/21/18 0752  . propranolol ER (INDERAL LA) 24 hr capsule 80 mg  80 mg Oral Daily Nira Conn A, NP   80 mg at 02/21/18 0752  . thiamine (VITAMIN B-1) tablet 100 mg  100 mg Oral Daily Nira Conn A, NP   100 mg at 02/21/18 0753  .  traZODone (DESYREL) tablet 50 mg  50 mg Oral QHS PRN Jackelyn Poling, NP   50 mg at 02/20/18 2127    Lab Results:  Results for orders placed or performed during the hospital encounter of 02/19/18 (from the past 48 hour(s))  Hemoglobin A1c     Status: None   Collection Time: 02/20/18  6:14 AM  Result Value Ref Range   Hgb A1c MFr Bld 5.6 4.8 - 5.6 %    Comment: (NOTE) Pre diabetes:          5.7%-6.4% Diabetes:              >6.4% Glycemic control for   <7.0% adults with diabetes    Mean Plasma Glucose 114.02 mg/dL    Comment: Performed at [facility], [address] [place], [place] [address]  Lipid panel     Status: None   Collection Time: 02/20/18  6:14 AM  Result Value Ref Range   Cholesterol 192 0 - 200 mg/dL   Triglycerides 604 <540 mg/dL   HDL 79 >98 mg/dL   Total CHOL/HDL Ratio 2.4 RATIO   VLDL 28 0 - 40 mg/dL   LDL Cholesterol 85 0 - 99 mg/dL    Comment:        Total Cholesterol/HDL:CHD Risk Coronary Heart Disease Risk Table                     Men   Women  1/2 Average Risk  3.4   3.3  Average Risk       5.0   4.4  2 X Average Risk   9.6   7.1  3 X Average Risk  23.4   11.0        Use the calculated Patient Ratio above and the CHD Risk Table to determine the patient's CHD Risk.        ATP III CLASSIFICATION (LDL):  <100     mg/dL   Optimal  161-096  mg/dL   Near or Above                    Optimal  130-159  mg/dL   Borderline  045-409  mg/dL   High  >811     mg/dL   Very High Performed at [facility], [address] [place], [place] [address]   TSH     Status: None   Collection Time: 02/20/18  6:14 AM  Result Value Ref Range   TSH 1.169 0.350 - 4.500 uIU/mL    Comment: Performed by a 3rd Generation assay with a functional sensitivity of <=0.01 uIU/mL. Performed at [facility], [address], [place], [place] [address]     Blood Alcohol level:  Lab Results  Component Value Date   ETH 28 (H) 02/18/2018    ETH 101 (H) 09/05/2017    Metabolic Disorder Labs: Lab Results  Component Value Date   HGBA1C 5.6 02/20/2018   MPG 114.02 02/20/2018   MPG 126 12/30/2015   No results found for: PROLACTIN Lab Results  Component Value Date   CHOL 192 02/20/2018   TRIG 142 02/20/2018   HDL 79 02/20/2018   CHOLHDL 2.4 02/20/2018   VLDL 28 02/20/2018   LDLCALC 85 02/20/2018   LDLCALC 124 (H) 12/30/2015    Physical Findings: AIMS: Facial and Oral Movements Muscles of Facial Expression: None, normal Lips and Perioral Area: None, normal Jaw: None, normal Tongue: None, normal,Extremity Movements Upper (arms, wrists, hands, fingers): None, normal Lower (legs, knees, ankles, toes): None, normal, Trunk Movements Neck, shoulders, hips: None, normal, Overall Severity Severity of abnormal movements (highest score from questions above): None, normal Incapacitation due to abnormal movements: None, normal Patient's awareness of abnormal movements (rate only patient's report): No Awareness, Dental Status Current problems with teeth and/or dentures?: No Does patient usually wear dentures?: No  CIWA:  CIWA-Ar Total: 7 COWS:  COWS Total Score: 7  Musculoskeletal: Strength & Muscle Tone: within normal limits Gait & Station: normal Patient leans: N/A  Psychiatric Specialty Exam: Physical Exam  Constitutional: He is oriented to person, place, and time. He appears well-developed and well-nourished.  HENT:  Head: Normocephalic.  Neurological: He is alert and oriented to person, place, and time.    ROS  Blood pressure 140/90, pulse 91, temperature 99 F (37.2 C), temperature source Oral, resp. rate 20, height 6\' 2"  (1.88 m), weight 97.5 kg (215 lb).Body mass index is 27.6 kg/m.  General Appearance: Disheveled  Eye Contact:  Fair  Speech:  Clear and Coherent  Volume:  Normal  Mood:  Depressed  Affect:  Appropriate  Thought Process:  Coherent  Orientation:  Full (Time, Place, and Person)  Thought  Content:  Logical  Suicidal Thoughts:  No  Homicidal Thoughts:  No  Memory:  NA Immediate;   Fair  Judgement:  Fair  Insight:  Fair  Psychomotor Activity:  Restlessness  Concentration:  Concentration: Fair  Recall:  [employer] of  Knowledge:  Fair  Language:  Fair  Akathisia:  No  Handed:  Right  AIMS (if indicated):     Assets:  Communication Skills Desire for Improvement Resilience  ADL's:  Intact  Cognition:  WNL  Sleep:  Number of Hours: 6.25     Treatment Plan Summary: Daily contact with patient to assess and evaluate symptoms and progress in treatment, Medication management and Plan Patient will be continued on alcohol withdrawal protocol.  His lorazepam will be continued under the CIIWA protocol.  His amlodipine will be continued, but because of the cocaine his propranolol will be stopped.  Thiamine and folic acid will be continued as well as trazodone.  The patient stated a desire to get into a substance abuse rehabilitation program, and social work is continuing to work on that.  Antonieta PertGreg Lawson Clary, MD 02/21/2018, 4:48 PM

## 2018-02-22 MED ORDER — FLUOXETINE HCL 20 MG PO CAPS
20.0000 mg | ORAL_CAPSULE | Freq: Every day | ORAL | Status: DC
  Administered 2018-02-23 – 2018-02-28 (×6): 20 mg via ORAL
  Filled 2018-02-22: qty 1
  Filled 2018-02-22: qty 14
  Filled 2018-02-22 (×5): qty 1
  Filled 2018-02-22: qty 14
  Filled 2018-02-22: qty 1

## 2018-02-22 MED ORDER — AMLODIPINE BESYLATE 10 MG PO TABS
10.0000 mg | ORAL_TABLET | Freq: Every day | ORAL | Status: DC
  Administered 2018-02-23 – 2018-02-28 (×6): 10 mg via ORAL
  Filled 2018-02-22: qty 14
  Filled 2018-02-22 (×5): qty 1
  Filled 2018-02-22: qty 14
  Filled 2018-02-22 (×2): qty 1

## 2018-02-22 NOTE — Progress Notes (Signed)
DATA ACTION RESPONSE  Objective- Pt. is visible in the dayroom, seen eating a snack.Presents with a animated/anxious affect and mood. Pt was isolative to room, seen sleeping for majority of shift. C/o of withdrawal s/s and anxiety this evening. See CIWA. Pt is requesting Seroquel for sleep. Pt states trazodone gives him nightmares.  Subjective- Denies having any SI/HI/AVH/Pain at this time. Is cooperative and remains safe on the unit.  1:1 interaction in private to establish rapport. Encouragement, education, & support given from staff.  PRN vistaril and ativan  requested and will re-eval accordingly.   Safety maintained with Q 15 checks. Continue with POC.

## 2018-02-22 NOTE — Progress Notes (Signed)
Recreation Therapy Notes  Date: 3.27.19 Time: 9:30 a.m. Location: [address] Dayroom   Group Topic: Stress Management   Goal Area(s) Addresses:  Goal 1.1: To reduce stress  -Patient will report feeling a reduction in stress level  -Patient will identify the importance of stress management  -Patient will participate during stress management group treatment     Intervention: Stress Management   Activity: Guided Imagery- Patients were in a peaceful environment with soft lighting enhancing patients mood. Patients were read a guided imagery script to help decrease stress levels   Education: Stress Management, Discharge Planning.    Education Outcome: Acknowledges edcuation/In group clarification offered/Needs additional education   Clinical Observations/Feedback:: Patient did not attend     Sheryle HailDarian Abenezer Odonell, Recreation Therapy Intern   Sheryle HailDarian Keaghan Bowens 02/22/2018 8:26 AM

## 2018-02-22 NOTE — [facility] Group Notes (Signed)
LCSW Group Therapy Note 02/22/2018 2:30 PM  Type of Therapy/Topic: Group Therapy: Feelings about Diagnosis  Participation Level: Active   Description of Group:  This group will allow patients to explore their thoughts and feelings about diagnoses they have received. Patients will be guided to explore their level of understanding and acceptance of these diagnoses. Facilitator will encourage patients to process their thoughts and feelings about the reactions of others to their diagnosis and will guide patients in identifying ways to discuss their diagnosis with significant others in their lives. This group will be process-oriented, with patients participating in exploration of their own experiences, giving and receiving support, and processing challenge from other group members.  Therapeutic Goals: 1. Patient will demonstrate understanding of diagnosis as evidenced by identifying two or more symptoms of the disorder 2. Patient will be able to express two feelings regarding the diagnosis 3. Patient will demonstrate their ability to communicate their needs through discussion and/or role play  Summary of Patient Progress:  Steven Daniels was engaged and participated throughout the group session. Steven Daniels reports that when he learned that he had a mental health diagnosis, he felt that he had been labeled. Steven Daniels reports that since being diagnosed, he feels that his family does not consider his opinions any more. Steven Daniels was excused from group by the doctor.      Therapeutic Modalities:  Cognitive Behavioral Therapy Brief Therapy Feelings Identification    Steven Daniels LCSWA Clinical Social Worker

## 2018-02-22 NOTE — Progress Notes (Signed)
Pt attended morning orientation/goals group and stated that his goal for the day is to speak with a social worker about rehab treatment centers once he will enter after being discharged

## 2018-02-22 NOTE — Progress Notes (Signed)
[facility] MD Progress Note  02/22/2018 4:32 PM Steven Daniels  MRN:  [id number] Subjective: Patient is seen and examined.  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  He is seen in follow-up.  He stated he is beginning to feel better today.  He stated his shakiness is improving.  He stated that his sleep is better as well.  He felt as though he was able to focus better.  He denied any current suicidal ideation.  We stopped his propranolol yesterday because of the potential interaction between beta-blockers and cocaine, and his blood pressure is elevated today, and we discussed increasing his amlodipine.  He feels a significant amount of guilt about his continued alcohol use disorder, and the problems that is caused his family members.  We discussed that at length.  He denied any side effects to his current medications. Principal Problem: Bipolar 1 disorder, depressed, severe (HCC) Diagnosis:   Patient Active Problem List   Diagnosis Date Noted  . Alcohol abuse with alcohol-induced mood disorder (HCC) [F10.14] 02/19/2018  . Bipolar 1 disorder, depressed, severe (HCC) [F31.4] 02/19/2018  . Chronic hepatitis C without hepatic coma (HCC) [B18.2] 12/29/2017  . Polysubstance abuse (HCC) [F19.10] 12/29/2017  . IVDU (intravenous drug user) [F19.90] 12/29/2017  . Chronic pain [G89.29] 12/29/2017  . Cigarette smoker [F17.210] 12/29/2017  . Hearing loss [H91.90] 12/29/2017  . H/O total hip arthroplasty, right [W09.811] 12/29/2017  . Alcohol dependence with withdrawal, uncomplicated (HCC) [F10.230] 12/29/2015  . Severe recurrent major depression without psychotic features (HCC) [F33.2] 12/29/2015   Total Time spent with patient: 20 minutes  Past Psychiatric History: See admission H&P  Past Medical History:  Past Medical History:  Diagnosis Date  .  Alcohol abuse   . Bipolar affective disorder (HCC)   . Depression   . Hard of hearing   . Heart murmur    per pt 12/28/15  . Hypertension     Past Surgical History:  Procedure Laterality Date  . HIP FRACTURE SURGERY Right    x 3  . KNEE SURGERY Right    Family History:  Family History  Problem Relation Age of Onset  . Other Mother        brain tumor behind left ear  . Alzheimer's disease Mother   . Skin cancer Father        melatomia  . Breast cancer Maternal Grandmother   . Heart disease Paternal Uncle   . Heart disease Cousin        fathers side  . Colon cancer Neg Hx   . Esophageal cancer Neg Hx    Family Psychiatric  History: See admission H&P Social History:  Social History   Substance and Sexual Activity  Alcohol Use No  . Frequency: Never   Comment: 5 forties daily, drank 2 today     Social History   Substance and Sexual Activity  Drug Use Yes  . Types: Cocaine   Comment: crack, heroin -snort last use 12/27/2017    Social History   Socioeconomic History  . Marital status: Single    Spouse name: Not on file  . Number of children: 0  . Years of education: Not on file  . Highest education level: Not on file  Occupational History  . Occupation: disabled  Social Needs  . Financial resource strain: Not on file  . Food insecurity:  Worry: Not on file    Inability: Not on file  . Transportation needs:    Medical: Not on file    Non-medical: Not on file  Tobacco Use  . Smoking status: Current Every Day Smoker    Packs/day: 1.00    Types: Cigarettes  . Smokeless tobacco: Never Used  Substance and Sexual Activity  . Alcohol use: No    Frequency: Never    Comment: 5 forties daily, drank 2 today  . Drug use: Yes    Types: Cocaine    Comment: crack, heroin -snort last use 12/27/2017  . Sexual activity: Not Currently    Birth control/protection: None  Lifestyle  . Physical activity:    Days per week: Not on file    Minutes per session: Not on  file  . Stress: Not on file  Relationships  . Social connections:    Talks on phone: Not on file    Gets together: Not on file    Attends religious service: Not on file    Active member of club or organization: Not on file    Attends meetings of clubs or organizations: Not on file    Relationship status: Not on file  Other Topics Concern  . Not on file  Social History Narrative   Divorced, no children   Veteran of 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  States intention to quit currently started opiates after her first hip surgery   He is a smoker   11/23/2017      Additional Social History:    Pain Medications: per report has history of abusing pain pills Prescriptions: denies Over the Counter: denies History of alcohol / drug use?: Yes Longest period of sobriety (when/how long): unsure Negative Consequences of Use: Financial, Personal relationships Withdrawal Symptoms: Sweats, Tremors Onset of Seizures: unknown Date of most recent seizure: unknown Name of Substance 1: alcohol 1 - Age of First Use: 18 1 - Amount (size/oz): (5) forty ounce beers 1 - Frequency: daily 1 - Duration: ongoing 1 - Last Use / Amount: yesterday Name of Substance 2: cocain 2 - Age of First Use: unsure 2 - Amount (size/oz): approximately $200 worth 2 - Frequency: daily  2 - Duration: on-going 2 - Last Use / Amount: yesterday                Sleep: Good  Appetite:  Good  Current Medications: Current Facility-Administered Medications  Medication Dose Route Frequency Provider Last Rate Last Dose  . alum & mag hydroxide-simeth (MAALOX/MYLANTA) 200-200-20 MG/5ML suspension 30 mL  30 mL Oral Q4H PRN Jackelyn Poling, NP      . Melene Muller ON 02/23/2018] amLODipine (NORVASC) tablet 10 mg  10 mg Oral Daily Antonieta Pert, MD      . feeding supplement (ENSURE ENLIVE)  (ENSURE ENLIVE) liquid 237 mL  237 mL Oral Daily PRN Cobos, Rockey Situ, MD      . Melene Muller ON 02/23/2018] FLUoxetine (PROZAC) capsule 20 mg  20 mg Oral Daily Antonieta Pert, MD      . gabapentin (NEURONTIN) capsule 200 mg  200 mg Oral BID Oneta Rack, NP   200 mg at 02/22/18 1610  . hydrOXYzine (ATARAX/VISTARIL) tablet 25 mg  25 mg Oral Q6H PRN Nira Conn A, NP   25 mg at 02/21/18 2229  . ibuprofen (ADVIL,MOTRIN)  tablet 600 mg  600 mg Oral Q6H PRN Nira Conn A, NP   600 mg at 02/21/18 0753  . loperamide (IMODIUM) capsule 2-4 mg  2-4 mg Oral PRN Nira Conn A, NP   4 mg at 02/20/18 0700  . LORazepam (ATIVAN) tablet 1 mg  1 mg Oral BID Jackelyn Poling, NP       Followed by  . [START ON 02/24/2018] LORazepam (ATIVAN) tablet 1 mg  1 mg Oral Daily Nira Conn A, NP      . LORazepam (ATIVAN) tablet 1 mg  1 mg Oral Q6H PRN Nira Conn A, NP   1 mg at 02/22/18 1414  . magnesium hydroxide (MILK OF MAGNESIA) suspension 30 mL  30 mL Oral Daily PRN Nira Conn A, NP   30 mL at 02/21/18 1703  . multivitamin with minerals tablet 1 tablet  1 tablet Oral Daily Nira Conn A, NP   1 tablet at 02/22/18 0807  . nicotine (NICODERM CQ - dosed in mg/24 hours) patch 21 mg  21 mg Transdermal Daily Cobos, Rockey Situ, MD   21 mg at 02/22/18 0808  . ondansetron (ZOFRAN-ODT) disintegrating tablet 4 mg  4 mg Oral Q6H PRN Nira Conn A, NP   4 mg at 02/20/18 2126  . potassium chloride SA (K-DUR,KLOR-CON) CR tablet 20 mEq  20 mEq Oral Daily Nira Conn A, NP   20 mEq at 02/22/18 0807  . thiamine (VITAMIN B-1) tablet 100 mg  100 mg Oral Daily Nira Conn A, NP   100 mg at 02/22/18 0807  . traZODone (DESYREL) tablet 50 mg  50 mg Oral QHS PRN Jackelyn Poling, NP   50 mg at 02/20/18 2127    Lab Results: No results found for this or any previous visit (from the past 48 hour(s)).  Blood Alcohol level:  Lab Results  Component Value Date   ETH 28 (H) 02/18/2018   ETH 101 (H) 09/05/2017    Metabolic Disorder  Labs: Lab Results  Component Value Date   HGBA1C 5.6 02/20/2018   MPG 114.02 02/20/2018   MPG 126 12/30/2015   No results found for: PROLACTIN Lab Results  Component Value Date   CHOL 192 02/20/2018   TRIG 142 02/20/2018   HDL 79 02/20/2018   CHOLHDL 2.4 02/20/2018   VLDL 28 02/20/2018   LDLCALC 85 02/20/2018   LDLCALC 124 (H) 12/30/2015    Physical Findings: AIMS: Facial and Oral Movements Muscles of Facial Expression: None, normal Lips and Perioral Area: None, normal Jaw: None, normal Tongue: None, normal,Extremity Movements Upper (arms, wrists, hands, fingers): None, normal Lower (legs, knees, ankles, toes): None, normal, Trunk Movements Neck, shoulders, hips: None, normal, Overall Severity Severity of abnormal movements (highest score from questions above): None, normal Incapacitation due to abnormal movements: None, normal Patient's awareness of abnormal movements (rate only patient's report): No Awareness, Dental Status Current problems with teeth and/or dentures?: No Does patient usually wear dentures?: No  CIWA:  CIWA-Ar Total: 1 COWS:  COWS Total Score: 7  Musculoskeletal: Strength & Muscle Tone: within normal limits Gait & Station: normal Patient leans: N/A  Psychiatric Specialty Exam: Physical Exam  Constitutional: He is oriented to person, place, and time. He appears well-developed and well-nourished.  Musculoskeletal: Normal range of motion.  Neurological: He is alert and oriented to person, place, and time.    ROS  Blood pressure (!) 137/104, pulse (!) 103, temperature 98.8 F (37.1 C), temperature source Oral, resp. rate 18, height 6'  2" (1.88 m), weight 97.5 kg (215 lb).Body mass index is 27.6 kg/m.  General Appearance: Fairly Groomed  Eye Contact:  Good  Speech:  Clear and Coherent  Volume:  Normal  Mood:  Anxious  Affect:  Appropriate  Thought Process:  Coherent  Orientation:  Full (Time, Place, and Person)  Thought Content:  Negative   Suicidal Thoughts:  No  Homicidal Thoughts:  No  Memory:  Immediate;   Fair  Judgement:  Fair  Insight:  Fair  Psychomotor Activity:  Increased  Concentration:  Concentration: Fair  Recall:  Fair  Fund of Knowledge:  Good  Language:  Good  Akathisia:  No  Handed:  Right  AIMS (if indicated):     Assets:  Communication Skills Desire for Improvement Housing Resilience  ADL's:  Intact  Cognition:  WNL  Sleep:  Number of Hours: 6.25     Treatment Plan Summary: Daily contact with patient to assess and evaluate symptoms and progress in treatment, Medication management and Plan Patient is seen and examined.  Patient is a 57 year old male with the above-stated past psychiatric history is seen in follow-up.  He continues to slowly improve.  His suicidal ideation has resolved.  He stated his shakiness and withdrawal symptoms from alcohol are improving as well.  We did decide to increase his fluoxetine to 20 mg p.o. daily.  He had previously been on fluoxetine upwards to 40 mg a day.  He also continues on gabapentin for pain and mood stability.  His blood pressure is still elevated after stopping the propranolol, and I am can increase his amlodipine to 10 mg p.o. daily.  He has no other complaints today, and we will continue to monitor the situation.  If his withdrawal symptoms continue to improve we anticipate discharge in 2 days.  He still desires aftercare at an inpatient rehabilitation facility.  Antonieta PertGreg Lawson Zeda Gangwer, MD 02/22/2018, 4:32 PM

## 2018-02-23 MED ORDER — HYDROXYZINE HCL 25 MG PO TABS
25.0000 mg | ORAL_TABLET | Freq: Four times a day (QID) | ORAL | Status: DC | PRN
  Administered 2018-02-23 – 2018-02-27 (×5): 25 mg via ORAL
  Filled 2018-02-23 (×3): qty 1
  Filled 2018-02-23: qty 10
  Filled 2018-02-23 (×3): qty 1

## 2018-02-23 MED ORDER — QUETIAPINE FUMARATE 50 MG PO TABS
50.0000 mg | ORAL_TABLET | Freq: Every day | ORAL | Status: DC
  Administered 2018-02-23: 50 mg via ORAL
  Filled 2018-02-23 (×2): qty 1

## 2018-02-23 NOTE — [facility] Group Notes (Signed)
LCSW Group Therapy Note   02/23/2018 1:15pm   Type of Therapy and Topic:  Group Therapy:  Overcoming Obstacles   Participation Level:  Did Not Attend--invited. Chose to remain in bed.    Description of Group:    In this group patients will be encouraged to explore what they see as obstacles to their own wellness and recovery. They will be guided to discuss their thoughts, feelings, and behaviors related to these obstacles. The group will process together ways to cope with barriers, with attention given to specific choices patients can make. Each patient will be challenged to identify changes they are motivated to make in order to overcome their obstacles. This group will be process-oriented, with patients participating in exploration of their own experiences as well as giving and receiving support and challenge from other group members.   Therapeutic Goals: 1. Patient will identify personal and current obstacles as they relate to admission. 2. Patient will identify barriers that currently interfere with their wellness or overcoming obstacles.  3. Patient will identify feelings, thought process and behaviors related to these barriers. 4. Patient will identify two changes they are willing to make to overcome these obstacles:      Summary of Patient Progress   x   Therapeutic Modalities:   Cognitive Behavioral Therapy Solution Focused Therapy Motivational Interviewing Relapse Prevention Therapy  Ledell PeoplesHeather N Smart, LCSW 02/23/2018 2:42 PM

## 2018-02-23 NOTE — Progress Notes (Signed)
Adult Psychoeducational Group Note  Date:  02/23/2018 Time:  4:00 PM  Group Topic/Focus:  Crisis Management:   The purpose of this group is to help patients create a crisis plan for use upon discharge or in the future, as needed.  Participation Level:  Active  Participation Quality:  Appropriate, Sharing  Affect:  Appropriate  Cognitive:  Appropriate and Oriented  Insight: Improving  Engagement in Group:  Developing/Improving  Modes of Intervention:  Discussion and Education  Additional Comments:  Patient participated in group and was appropriate on the topic of identifying what a crisis means to them and discussing coping skills.   Rae Lipsmanda A Chealsea Paske 02/23/2018, 5:57 PM

## 2018-02-23 NOTE — Progress Notes (Signed)
DATA ACTION RESPONSE  Objective-Pt. is visible in the dayroom, seeneating a snack.Presents with aanimated/anxiousaffect and mood. Pt's behavior is incongruent with thought content.C/o of withdrawal s/s, agitation, and anxiety this evening. Seroquel was started at bedtime. Pt states he is waiting for acceptance to long term treatment.  Subjective-Denies having any SI/HI/AVH/Pain at this time. Is cooperative and remains safe on the unit.  1:1 interaction in private to establish rapport. Encouragement, education, &support given from staff.  Safety maintained with Q 15 checks. Continue with POC.

## 2018-02-23 NOTE — Progress Notes (Signed)
DAR NOTE: Patient presents with anxious affect and depressed mood.  Pt stated his withdrawls are getting better, but still having aches all over. Pt still isolates and sleep most of the day. Denies auditory and visual hallucinations.  Rates depression at 8, hopelessness at 7, and anxiety at 7.  Maintained on routine safety checks.  Medications given as prescribed.  Support and encouragement offered as needed.   States goal for today is " Go to rehab."  Will continue to monitor.

## 2018-02-24 MED ORDER — DIPHENHYDRAMINE HCL 50 MG PO CAPS
50.0000 mg | ORAL_CAPSULE | Freq: Once | ORAL | Status: AC
  Administered 2018-02-24: 50 mg via ORAL
  Filled 2018-02-24: qty 2
  Filled 2018-02-24: qty 1

## 2018-02-24 MED ORDER — PRAZOSIN HCL 1 MG PO CAPS
1.0000 mg | ORAL_CAPSULE | Freq: Every day | ORAL | Status: DC
  Administered 2018-02-24: 1 mg via ORAL
  Filled 2018-02-24 (×2): qty 1

## 2018-02-24 MED ORDER — QUETIAPINE FUMARATE 100 MG PO TABS
100.0000 mg | ORAL_TABLET | Freq: Every day | ORAL | Status: DC
  Administered 2018-02-24: 100 mg via ORAL
  Filled 2018-02-24 (×2): qty 1

## 2018-02-24 NOTE — Progress Notes (Signed)
Pt attend wrap up group. His day was a 5. He did not accomplish his goal. He was supposed to be discharge.

## 2018-02-24 NOTE — Progress Notes (Signed)
Patient ID: Simone CuriaClay Konczal, male   DOB: 12/10/1960, 57 y.o.   MRN: [id number]  Pt currently presents with a flat affect and guarded behavior. Pt reports to Clinical research associatewriter that [facility] Pt states "It was a good day today." Endorses ongoing feelings of negativity towards himself tonight. Pt reports a change in his sleep medication regimen is occurring tonight due to increased vivid dreams and unrest.   Pt provided with medications per providers orders. Pt's labs and vitals were monitored throughout the night. Pt given a 1:1 about emotional and mental status. Pt supported and encouraged to express concerns and questions. Pt educated on medications and assertiveness techniques.   Pt's safety ensured with 15 minute and environmental checks. Pt currently denies SI/HI and A/V hallucinations. Pt verbally agrees to seek staff if SI/HI or A/VH occurs and to consult with staff before acting on any harmful thoughts. Will continue POC.

## 2018-02-24 NOTE — Progress Notes (Signed)
Adult Psychoeducational Group Note  Date:  02/24/2018 Time:  4:00 PM  Group Topic/Focus: Open Discussion Led by two RNs, patients were encouraged to talk about what brought them to the hospital, what coping skills have helped them in the past, and bring up any concerns they had for staff. Staff opened the discussion for patients to discuss any topic.  Participation Level:  Active  Participation Quality:  Appropriate, Attentive, Sharing and Supportive  Affect:  Appropriate  Cognitive:  Alert, Appropriate and Oriented  Insight: Improving  Engagement in Group:  Developing/Improving, Engaged and Supportive  Modes of Intervention:  Clarification, Discussion, Exploration, Socialization and Support  Additional Comments:  Patient reports he is not sure who he is getting sober for, "me or my wife". Patient reports, "losing my mom has been the hardest thing I've ever experienced". Patient states, "I am tired of life passing me by and I am in a drunken haze". Patient reports, "I was trying to kill myself by drinking, but that's not what I really want". Patient demonstrated appropriate insight and was supportive of peers.  Marchelle Folksmanda A Tiandra Swoveland 02/24/2018, 5:00 PM

## 2018-02-24 NOTE — Progress Notes (Signed)
DAR NOTE: Pt present with flat affect and depressed mood in the unit. Pt has been isolating himself and has been in bed most of the time. Pt complained of being agitated and irritable and having withdrawal. Pt was put on Seroquel 50 mg last night. Pt stated it did not help him, pt stated its only Ativan that has worked for him. Pt encouraged to utilize coping skills and to get involved in the activities going on in the unit. Pt denies physical pain, took all his meds as scheduled. As per self inventory, pt had a poor night sleep, good appetite, low energy, and poor concentration. Pt rate depression at 07, hopeless ness at 07, and anxiety at 06. Pt's safety ensured with 15 minute and environmental checks. Pt currently denies SI/HI and A/V hallucinations. Pt verbally agrees to seek staff if SI/HI or A/VH occurs and to consult with staff before acting on these thoughts. Will continue POC.

## 2018-02-24 NOTE — [facility] Group Notes (Signed)
LCSW Group Therapy Note  02/24/2018 1:15pm  Type of Therapy and Topic:  Group Therapy:  Feelings around Relapse and Recovery  Participation Level:  Active   Description of Group:    Patients in this group will discuss emotions they experience before and after a relapse. They will process how experiencing these feelings, or avoidance of experiencing them, relates to having a relapse. Facilitator will guide patients to explore emotions they have related to recovery. Patients will be encouraged to process which emotions are more powerful. They will be guided to discuss the emotional reaction significant others in their lives may have to their relapse or recovery. Patients will be assisted in exploring ways to respond to the emotions of others without this contributing to a relapse.  Therapeutic Goals: 1. Patient will identify two or more emotions that lead to a relapse for them 2. Patient will identify two emotions that result when they relapse 3. Patient will identify two emotions related to recovery 4. Patient will demonstrate ability to communicate their needs through discussion and/or role plays   Summary of Patient Progress:  Steven Daniels was attentive and engaged during today's processing group. He shared that his goal to avoid relapse [facility]. "I really need to stay here until then so I don't relapse." Steven Daniels continues to show progress in the group setting with improving insight.    Therapeutic Modalities:   Cognitive Behavioral Therapy Solution-Focused Therapy Assertiveness Training Relapse Prevention Therapy   Ledell PeoplesHeather N Smart, LCSW 02/24/2018 2:45 PM

## 2018-02-24 NOTE — Progress Notes (Signed)
Recreation Therapy Notes  Date: 3.29.19 Time: 9:30 a.m. Location: 300 [address] Dayroom   Group Topic: Stress Management   Goal Area(s) Addresses:  Goal 1.1: To reduce stress  -Patient will report feeling a reduction in stress level  -Patient will identify the importance of stress management  -Patient will participate during stress management group treatment     Intervention: Stress Management   Activity: Meditation- Patients were in a peaceful environment with soft lighting enhancing patients mood. Patients listened to a mindfulness meditation voiceover to help reduce stress levels    Education: Stress Management, Discharge Planning.    Education Outcome: Acknowledges edcuation/In group clarification offered/Needs additional education   Clinical Observations/Feedback:: Patient did not attend     Steven Daniels, Recreation Therapy Intern   Steven Daniels 02/24/2018 8:23 AM

## 2018-02-24 NOTE — Progress Notes (Signed)
[facility] MD Progress Note  02/24/2018 1:31 PM Steven Daniels  MRN:  [id number] Subjective:  Patient is seen and examined.  Patient is a 57 year old male with a past psychiatric history significant for bipolar disorder, alcohol use disorder, cocaine use disorder and hepatitis C who was seen in follow-up.  He is not doing as well as he had on yesterday.  He stated he is felt dizzy, has had some cold sweats, and overall just feels physically weak.  Nursing staff has checked his vital signs, and his blood pressure has been stable.  His most recent blood pressure this a.m. was 135/96.  He did not report any chest pain or other associated symptoms.  Staff had contact with his wife, and she stated that he was using 100s of dollars of cocaine a day, and this in addition to his alcohol intake.  She stated that his behaviors had very much disrupted the family and caused a great deal of distress.  He still has decreased mood, today actually asked to increase his fluoxetine.  I told him we just increased it on the 28th, and by increasing it today would not really have been a great benefit.  He stated that the addition of the Seroquel did not really help his sleep, and he continued to have disturbing dreams.  We discussed the possibility of adding prazosin.  He denied any direct suicidal ideation, but still has fleeting suicidal thoughts.  He remains adamant about trying to get into a rehabilitation facility.  The first day that he could be entered reviewed for this would be on Wednesday. Principal Problem: Bipolar 1 disorder, depressed, severe (HCC) Diagnosis:   Patient Active Problem List   Diagnosis Date Noted  . Alcohol abuse with alcohol-induced mood disorder (HCC) [F10.14] 02/19/2018  . Bipolar 1 disorder, depressed, severe (HCC) [F31.4] 02/19/2018  . Chronic hepatitis C without hepatic coma (HCC) [B18.2] 12/29/2017  . Polysubstance abuse (HCC) [F19.10] 12/29/2017  . IVDU (intravenous drug user) [F19.90] 12/29/2017   . Chronic pain [G89.29] 12/29/2017  . Cigarette smoker [F17.210] 12/29/2017  . Hearing loss [H91.90] 12/29/2017  . H/O total hip arthroplasty, right [W09.811] 12/29/2017  . Cocaine abuse (HCC) [F14.10] 12/09/2017  . Bipolar affective disorder, current episode mixed (HCC) [F31.60] 03/14/2016  . Alcohol dependence with withdrawal, uncomplicated (HCC) [F10.230] 12/29/2015  . Severe recurrent major depression without psychotic features (HCC) [F33.2] 12/29/2015   Total Time spent with patient: 20 minutes  Past Psychiatric History: See admission H&P  Past Medical History:  Past Medical History:  Diagnosis Date  . Alcohol abuse   . Bipolar affective disorder (HCC)   . Depression   . Hard of hearing   . Heart murmur    per pt 12/28/15  . Hypertension     Past Surgical History:  Procedure Laterality Date  . HIP FRACTURE SURGERY Right    x 3  . KNEE SURGERY Right    Family History:  Family History  Problem Relation Age of Onset  . Other Mother        brain tumor behind left ear  . Alzheimer's disease Mother   . Skin cancer Father        melatomia  . Breast cancer Maternal Grandmother   . Heart disease Paternal Uncle   . Heart disease Cousin        fathers side  . Colon cancer Neg Hx   . Esophageal cancer Neg Hx    Family Psychiatric  History: See admission H&P Social History:  Social History   Substance and Sexual Activity  Alcohol Use No  . Frequency: Never   Comment: 5 forties daily, drank 2 today     Social History   Substance and Sexual Activity  Drug Use Yes  . Types: Cocaine   Comment: crack, heroin -snort last use 12/27/2017    Social History   Socioeconomic History  . Marital status: Single    Spouse name: Not on file  . Number of children: 0  . Years of education: Not on file  . Highest education level: Not on file  Occupational History  . Occupation: disabled  Social Needs  . Financial resource strain: Not on file  . Food insecurity:    Worry:  Not on file    Inability: Not on file  . Transportation needs:    Medical: Not on file    Non-medical: Not on file  Tobacco Use  . Smoking status: Current Every Day Smoker    Packs/day: 1.00    Types: Cigarettes  . Smokeless tobacco: Never Used  Substance and Sexual Activity  . Alcohol use: No    Frequency: Never    Comment: 5 forties daily, drank 2 today  . Drug use: Yes    Types: Cocaine    Comment: crack, heroin -snort last use 12/27/2017  . Sexual activity: Not Currently    Birth control/protection: None  Lifestyle  . Physical activity:    Days per week: Not on file    Minutes per session: Not on file  . Stress: Not on file  Relationships  . Social connections:    Talks on phone: Not on file    Gets together: Not on file    Attends religious service: Not on file    Active member of club or organization: Not on file    Attends meetings of clubs or organizations: Not on file    Relationship status: Not on file  Other Topics Concern  . Not on file  Social History Narrative   Divorced, no children   Veteran of 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  States intention to quit currently started opiates after her first hip surgery   He is a smoker   11/23/2017      Additional Social History:    Pain Medications: per report has history of abusing pain pills Prescriptions: denies Over the Counter: denies History of alcohol / drug use?: Yes Longest period of sobriety (when/how long): unsure Negative Consequences of Use: Financial, Personal relationships Withdrawal Symptoms: Sweats, Tremors Onset of Seizures: unknown Date of most recent seizure: unknown Name of Substance 1: alcohol 1 - Age of First Use: 18 1 - Amount (size/oz): (5) forty ounce beers 1 - Frequency: daily 1 - Duration: ongoing 1 - Last Use / Amount: yesterday Name of Substance  2: cocain 2 - Age of First Use: unsure 2 - Amount (size/oz): approximately $200 worth 2 - Frequency: daily  2 - Duration: on-going 2 - Last Use / Amount: yesterday                Sleep: Fair  Appetite:  Fair  Current Medications: Current Facility-Administered Medications  Medication Dose Route Frequency Provider Last Rate Last Dose  . alum & mag hydroxide-simeth (MAALOX/MYLANTA) 200-200-20 MG/5ML suspension 30 mL  30 mL Oral Q4H PRN Jackelyn PolingBerry, Jason A, NP      .  amLODipine (NORVASC) tablet 10 mg  10 mg Oral Daily Antonieta Pert, MD   10 mg at 02/24/18 0800  . feeding supplement (ENSURE ENLIVE) (ENSURE ENLIVE) liquid 237 mL  237 mL Oral Daily PRN Cobos, Fernando A, MD      . FLUoxetine (PROZAC) capsule 20 mg  20 mg Oral Daily Antonieta Pert, MD   20 mg at 02/24/18 0759  . gabapentin (NEURONTIN) capsule 200 mg  200 mg Oral BID Oneta Rack, NP   200 mg at 02/24/18 0800  . hydrOXYzine (ATARAX/VISTARIL) tablet 25 mg  25 mg Oral Q6H PRN Armandina Stammer I, NP   25 mg at 02/23/18 1822  . ibuprofen (ADVIL,MOTRIN) tablet 600 mg  600 mg Oral Q6H PRN Nira Conn A, NP   600 mg at 02/23/18 0920  . magnesium hydroxide (MILK OF MAGNESIA) suspension 30 mL  30 mL Oral Daily PRN Nira Conn A, NP   30 mL at 02/21/18 1703  . multivitamin with minerals tablet 1 tablet  1 tablet Oral Daily Nira Conn A, NP   1 tablet at 02/24/18 0800  . nicotine (NICODERM CQ - dosed in mg/24 hours) patch 21 mg  21 mg Transdermal Daily Cobos, Rockey Situ, MD   21 mg at 02/24/18 0800  . potassium chloride SA (K-DUR,KLOR-CON) CR tablet 20 mEq  20 mEq Oral Daily Nira Conn A, NP   20 mEq at 02/24/18 0759  . prazosin (MINIPRESS) capsule 1 mg  1 mg Oral QHS Antonieta Pert, MD      . QUEtiapine (SEROQUEL) tablet 100 mg  100 mg Oral QHS Antonieta Pert, MD      . thiamine (VITAMIN B-1) tablet 100 mg  100 mg Oral Daily Nira Conn A, NP   100 mg at 02/24/18 0800    Lab Results: No results found for this or  any previous visit (from the past 48 hour(s)).  Blood Alcohol level:  Lab Results  Component Value Date   ETH 28 (H) 02/18/2018   ETH 101 (H) 09/05/2017    Metabolic Disorder Labs: Lab Results  Component Value Date   HGBA1C 5.6 02/20/2018   MPG 114.02 02/20/2018   MPG 126 12/30/2015   No results found for: PROLACTIN Lab Results  Component Value Date   CHOL 192 02/20/2018   TRIG 142 02/20/2018   HDL 79 02/20/2018   CHOLHDL 2.4 02/20/2018   VLDL 28 02/20/2018   LDLCALC 85 02/20/2018   LDLCALC 124 (H) 12/30/2015    Physical Findings: AIMS: Facial and Oral Movements Muscles of Facial Expression: None, normal Lips and Perioral Area: None, normal Jaw: None, normal Tongue: None, normal,Extremity Movements Upper (arms, wrists, hands, fingers): None, normal Lower (legs, knees, ankles, toes): None, normal, Trunk Movements Neck, shoulders, hips: None, normal, Overall Severity Severity of abnormal movements (highest score from questions above): None, normal Incapacitation due to abnormal movements: None, normal Patient's awareness of abnormal movements (rate only patient's report): No Awareness, Dental Status Current problems with teeth and/or dentures?: No Does patient usually wear dentures?: No  CIWA:  CIWA-Ar Total: 2 COWS:  COWS Total Score: 7  Musculoskeletal: Strength & Muscle Tone: within normal limits Gait & Station: normal Patient leans: N/A  Psychiatric Specialty Exam: Physical Exam  Constitutional: He is oriented to person, place, and time. He appears well-developed and well-nourished.  Respiratory: Effort normal.  Musculoskeletal: Normal range of motion.  Neurological: He is alert and oriented to person, place, and time.    Review  of Systems  Constitutional: Positive for malaise/fatigue.  Neurological: Positive for dizziness.    Blood pressure (!) 135/96, pulse 84, temperature 98.7 F (37.1 C), resp. rate 16, height 6\' 2"  (1.88 m), weight 97.5 kg (215  lb).Body mass index is 27.6 kg/m.  General Appearance: Disheveled  Eye Contact:  Fair  Speech:  Normal Rate  Volume:  Decreased  Mood:  Dysphoric  Affect:  Congruent  Thought Process:  Coherent  Orientation:  Full (Time, Place, and Person)  Thought Content:  Logical  Suicidal Thoughts:  Yes.  without intent/plan  Homicidal Thoughts:  No  Memory:  Immediate;   Fair  Judgement:  Impaired  Insight:  Lacking  Psychomotor Activity:  Decreased  Concentration:  Concentration: Fair  Recall:  [employer] of Knowledge:  Fair  Language:  Good  Akathisia:  No  Handed:  Right  AIMS (if indicated):     Assets:  Communication Skills Desire for Improvement  ADL's:  Intact  Cognition:  WNL  Sleep:  Number of Hours: 6.75     Treatment Plan Summary: Daily contact with patient to assess and evaluate symptoms and progress in treatment, Medication management and Plan Patient is seen and examined.  Patient is a 57 year old male with the above-stated past psychiatric history seen in follow-up.  His fluoxetine was just increased on the 28th, and I do not think it would be appropriate to increase it right now.  We may increase it in a day or 2.  He is not having any side effects to it, but he remains still anxious, depressed.  He still not sleeping great, and Emina increase the Seroquel 200 mg p.o. nightly.  Have also added prazosin to suppress dreams and nightmares.  Hopefully that will be of benefit, and will also help some of his blood pressure issues.  He remains on amlodipine for blood pressure control.  He also is on Neurontin for mood stability.  Social work continues to work to get him into a rehabilitation facility.  With the additional information from his wife about the degree of cocaine use as well as alcohol use it is clear that he does require inpatient treatment.  Antonieta Pert, MD 02/24/2018, 1:31 PM

## 2018-02-25 DIAGNOSIS — Z736 Limitation of activities due to disability: Secondary | ICD-10-CM

## 2018-02-25 DIAGNOSIS — R450 Nervousness: Secondary | ICD-10-CM

## 2018-02-25 DIAGNOSIS — F419 Anxiety disorder, unspecified: Secondary | ICD-10-CM

## 2018-02-25 DIAGNOSIS — F1014 Alcohol abuse with alcohol-induced mood disorder: Secondary | ICD-10-CM

## 2018-02-25 DIAGNOSIS — G4700 Insomnia, unspecified: Secondary | ICD-10-CM

## 2018-02-25 MED ORDER — QUETIAPINE FUMARATE 50 MG PO TABS
150.0000 mg | ORAL_TABLET | Freq: Every day | ORAL | Status: DC
  Administered 2018-02-25 – 2018-02-26 (×2): 150 mg via ORAL
  Filled 2018-02-25 (×4): qty 3

## 2018-02-25 MED ORDER — ACETAMINOPHEN 500 MG PO TABS
1000.0000 mg | ORAL_TABLET | Freq: Once | ORAL | Status: AC
  Administered 2018-02-25: 1000 mg via ORAL
  Filled 2018-02-25 (×2): qty 2

## 2018-02-25 MED ORDER — GABAPENTIN 300 MG PO CAPS
300.0000 mg | ORAL_CAPSULE | Freq: Three times a day (TID) | ORAL | Status: DC
  Administered 2018-02-25 – 2018-02-28 (×11): 300 mg via ORAL
  Filled 2018-02-25 (×2): qty 42
  Filled 2018-02-25 (×10): qty 1
  Filled 2018-02-25: qty 42
  Filled 2018-02-25 (×4): qty 1
  Filled 2018-02-25 (×3): qty 42
  Filled 2018-02-25 (×2): qty 1

## 2018-02-25 NOTE — [facility] Group Notes (Signed)
[facility] Group Notes: (Clinical Social Work)   02/25/2018      Type of Therapy:  Group Therapy   Participation Level:  Did Not Attend despite MHT prompting   Ladan Vanderzanden Grossman-Orr, LCSW 02/25/2018, 1:04 PM     

## 2018-02-25 NOTE — Progress Notes (Addendum)
D. Pt presents with an anxious affect and calm, cooperative behavior- friendly during interactions. Pt reports having slept poorly last night. Pt observed interacting well with peers in dayroom. Per pt's self inventory, pt rates his depression, hopelessness and anxiety a 7/6/7, respectively. Pt writes that his most important goal today to work on is "get to rehab"..Pt currently denies SI/HI and AV hallucinations.  A. Labs and vitals monitored. Pt compliant with medications. Pt supported emotionally and encouraged to express concerns and ask questions.   R. Pt remains safe with 15 minute checks. Will continue POC.

## 2018-02-25 NOTE — Progress Notes (Signed)
Patient ID: Steven Daniels, male   DOB: 07/29/1961, 57 y.o.   MRN: [id number]  Pt approached nurses station reporting "shakes" and feeling "like I'm having a panic attack." Pt face is red, some sweat noted on brow. Pt was seen prior to interaction breathing heavily and tossing in bed. Writer offered him extra pillows for comfort and head raise, pt refused. Pt suspects Minipress as the culprit to the symptoms. Provider, Nira ConnJason Berry notified. Given one time dose of Benadryl. Will continue to monitor for progressing symptoms of anaphylaxis. Pt slept a total of 3.5 hours last night.

## 2018-02-25 NOTE — [facility] Group Notes (Signed)
[facility] Group Notes:  (Nursing)  Date:  02/25/2018  Time:  1:15 PM Type of Therapy:  Nurse Education  Participation Level:  Active  Participation Quality:  Appropriate  Affect:  Appropriate  Cognitive:  Alert and Appropriate  Insight:  Appropriate and Good  Engagement in Group:  Engaged  Modes of Intervention:  Discussion, Education and Exploration  Summary of Progress/Problems  Played non- competitive learning [employer]/communication board game that fosters listening skills as well as self expression. Pt actively engaged - responding appropriately to questions Shela NevinValerie S Jasen Hartstein 02/25/2018, 1:51 PM

## 2018-02-25 NOTE — Progress Notes (Signed)
[facility] ([facility]) MD Progress Note  02/25/2018 2:18 PM Steven Daniels  MRN:  [id number]   Subjective:  Patient reports that the medication he started last night kept him up all night. "I don't remember the name but I started it last nigh and I had a reaction. They had to give me benadryl."  He reports feeling sweaty, irritable, anxious, and having hot flashes. He denies any SI/HI/AVH and contracts for safety.   Objective: Patient's chart and findings reviewed and discussed with treatment team. Patient presents in his room and is pleasant and cooperative. He has stayed in his room most of the morning. Suspect his complaints to be from withdrawal symptoms and will increase the Neurontin to 2300 mg TID. Will stop the Prazosin and increase the Seroquel to 150 mg QHS.    Principal Problem: Bipolar 1 disorder, depressed, severe (HCC) Diagnosis:   Patient Active Problem List   Diagnosis Date Noted  . Alcohol abuse with alcohol-induced mood disorder (HCC) [F10.14] 02/19/2018  . Bipolar 1 disorder, depressed, severe (HCC) [F31.4] 02/19/2018  . Chronic hepatitis C without hepatic coma (HCC) [B18.2] 12/29/2017  . Polysubstance abuse (HCC) [F19.10] 12/29/2017  . IVDU (intravenous drug user) [F19.90] 12/29/2017  . Chronic pain [G89.29] 12/29/2017  . Cigarette smoker [F17.210] 12/29/2017  . Hearing loss [H91.90] 12/29/2017  . H/O total hip arthroplasty, right [W09.811] 12/29/2017  . Cocaine abuse (HCC) [F14.10] 12/09/2017  . Bipolar affective disorder, current episode mixed (HCC) [F31.60] 03/14/2016  . Alcohol dependence with withdrawal, uncomplicated (HCC) [F10.230] 12/29/2015  . Severe recurrent major depression without psychotic features (HCC) [F33.2] 12/29/2015   Total Time spent with patient: 30 minutes  Past Psychiatric History: See H&P  Past Medical History:  Past Medical History:  Diagnosis Date  . Alcohol abuse   . Bipolar affective disorder (HCC)   . Depression   . Hard of hearing   . Heart murmur     per pt 12/28/15  . Hypertension     Past Surgical History:  Procedure Laterality Date  . HIP FRACTURE SURGERY Right    x 3  . KNEE SURGERY Right    Family History:  Family History  Problem Relation Age of Onset  . Other Mother        brain tumor behind left ear  . Alzheimer's disease Mother   . Skin cancer Father        melatomia  . Breast cancer Maternal Grandmother   . Heart disease Paternal Uncle   . Heart disease Cousin        fathers side  . Colon cancer Neg Hx   . Esophageal cancer Neg Hx    Family Psychiatric  History: See H&P Social History:  Social History   Substance and Sexual Activity  Alcohol Use No  . Frequency: Never   Comment: 5 forties daily, drank 2 today     Social History   Substance and Sexual Activity  Drug Use Yes  . Types: Cocaine   Comment: crack, heroin -snort last use 12/27/2017    Social History   Socioeconomic History  . Marital status: Single    Spouse name: Not on file  . Number of children: 0  . Years of education: Not on file  . Highest education level: Not on file  Occupational History  . Occupation: disabled  Social Needs  . Financial resource strain: Not on file  . Food insecurity:    Worry: Not on file    Inability: Not on file  . Transportation  needs:    Medical: Not on file    Non-medical: Not on file  Tobacco Use  . Smoking status: Current Every Day Smoker    Packs/day: 1.00    Types: Cigarettes  . Smokeless tobacco: Never Used  Substance and Sexual Activity  . Alcohol use: No    Frequency: Never    Comment: 5 forties daily, drank 2 today  . Drug use: Yes    Types: Cocaine    Comment: crack, heroin -snort last use 12/27/2017  . Sexual activity: Not Currently    Birth control/protection: None  Lifestyle  . Physical activity:    Days per week: Not on file    Minutes per session: Not on file  . Stress: Not on file  Relationships  . Social connections:    Talks on phone: Not on file    Gets together:  Not on file    Attends religious service: Not on file    Active member of club or organization: Not on file    Attends meetings of clubs or organizations: Not on file    Relationship status: Not on file  Other Topics Concern  . Not on file  Social History Narrative   Divorced, no children   Veteran of 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  States intention to quit currently started opiates after her first hip surgery   He is a smoker   11/23/2017      Additional Social History:    Pain Medications: per report has history of abusing pain pills Prescriptions: denies Over the Counter: denies History of alcohol / drug use?: Yes Longest period of sobriety (when/how long): unsure Negative Consequences of Use: Financial, Personal relationships Withdrawal Symptoms: Sweats, Tremors Onset of Seizures: unknown Date of most recent seizure: unknown Name of Substance 1: alcohol 1 - Age of First Use: 18 1 - Amount (size/oz): (5) forty ounce beers 1 - Frequency: daily 1 - Duration: ongoing 1 - Last Use / Amount: yesterday Name of Substance 2: cocain 2 - Age of First Use: unsure 2 - Amount (size/oz): approximately $200 worth 2 - Frequency: daily  2 - Duration: on-going 2 - Last Use / Amount: yesterday                Sleep: Fair  Appetite:  Good  Current Medications: Current Facility-Administered Medications  Medication Dose Route Frequency Provider Last Rate Last Dose  . alum & mag hydroxide-simeth (MAALOX/MYLANTA) 200-200-20 MG/5ML suspension 30 mL  30 mL Oral Q4H PRN Nira ConnBerry, Jason A, NP      . amLODipine (NORVASC) tablet 10 mg  10 mg Oral Daily Antonieta Pertlary, Greg Lawson, MD   10 mg at 02/25/18 16100807  . feeding supplement (ENSURE ENLIVE) (ENSURE ENLIVE) liquid 237 mL  237 mL Oral Daily PRN Cobos, Fernando A, MD      . FLUoxetine (PROZAC) capsule 20 mg  20 mg  Oral Daily Antonieta Pertlary, Greg Lawson, MD   20 mg at 02/25/18 [id number]  . gabapentin (NEURONTIN) capsule 300 mg  300 mg Oral TID Jerriyah Louis, Gerlene Burdockravis B, FNP   300 mg at 02/25/18 1150  . hydrOXYzine (ATARAX/VISTARIL) tablet 25 mg  25 mg Oral Q6H PRN Armandina StammerNwoko, Agnes I, NP   25 mg at 02/25/18 1344  . ibuprofen (ADVIL,MOTRIN) tablet 600 mg  600 mg Oral Q6H PRN Jackelyn PolingBerry, Jason A, NP  600 mg at 02/24/18 1601  . magnesium hydroxide (MILK OF MAGNESIA) suspension 30 mL  30 mL Oral Daily PRN Nira Conn A, NP   30 mL at 02/21/18 1703  . multivitamin with minerals tablet 1 tablet  1 tablet Oral Daily Nira Conn A, NP   1 tablet at 02/25/18 0807  . nicotine (NICODERM CQ - dosed in mg/24 hours) patch 21 mg  21 mg Transdermal Daily Cobos, Rockey Situ, MD   21 mg at 02/25/18 0814  . potassium chloride SA (K-DUR,KLOR-CON) CR tablet 20 mEq  20 mEq Oral Daily Nira Conn A, NP   20 mEq at 02/25/18 0807  . QUEtiapine (SEROQUEL) tablet 150 mg  150 mg Oral QHS Keaundre Thelin B, FNP      . thiamine (VITAMIN B-1) tablet 100 mg  100 mg Oral Daily Nira Conn A, NP   100 mg at 02/25/18 0807    Lab Results: No results found for this or any previous visit (from the past 48 hour(s)).  Blood Alcohol level:  Lab Results  Component Value Date   ETH 28 (H) 02/18/2018   ETH 101 (H) 09/05/2017    Metabolic Disorder Labs: Lab Results  Component Value Date   HGBA1C 5.6 02/20/2018   MPG 114.02 02/20/2018   MPG 126 12/30/2015   No results found for: PROLACTIN Lab Results  Component Value Date   CHOL 192 02/20/2018   TRIG 142 02/20/2018   HDL 79 02/20/2018   CHOLHDL 2.4 02/20/2018   VLDL 28 02/20/2018   LDLCALC 85 02/20/2018   LDLCALC 124 (H) 12/30/2015    Physical Findings: AIMS: Facial and Oral Movements Muscles of Facial Expression: None, normal Lips and Perioral Area: None, normal Jaw: None, normal Tongue: None, normal,Extremity Movements Upper (arms, wrists, hands, fingers): None, normal Lower (legs, knees, ankles, toes):  None, normal, Trunk Movements Neck, shoulders, hips: None, normal, Overall Severity Severity of abnormal movements (highest score from questions above): None, normal Incapacitation due to abnormal movements: None, normal Patient's awareness of abnormal movements (rate only patient's report): No Awareness, Dental Status Current problems with teeth and/or dentures?: No Does patient usually wear dentures?: No  CIWA:  CIWA-Ar Total: 2 COWS:  COWS Total Score: 7  Musculoskeletal: Strength & Muscle Tone: within normal limits Gait & Station: normal Patient leans: N/A  Psychiatric Specialty Exam: Physical Exam  Nursing note and vitals reviewed. Constitutional: He is oriented to person, place, and time. He appears well-developed and well-nourished.  Cardiovascular: Normal rate.  Respiratory: Effort normal.  Musculoskeletal: Normal range of motion.  Neurological: He is alert and oriented to person, place, and time.  Skin: Skin is warm.    Review of Systems  Constitutional: Negative.   HENT: Negative.   Eyes: Negative.   Respiratory: Negative.   Cardiovascular: Negative.   Gastrointestinal: Negative.   Genitourinary: Negative.   Musculoskeletal: Negative.   Skin: Negative.   Neurological: Negative.   Endo/Heme/Allergies: Negative.   Psychiatric/Behavioral: Positive for depression and substance abuse. Negative for hallucinations and suicidal ideas. The patient is nervous/anxious and has insomnia.     Blood pressure 136/86, pulse 87, temperature 98.1 F (36.7 C), temperature source Oral, resp. rate 16, height 6\' 2"  (1.88 m), weight 97.5 kg (215 lb).Body mass index is 27.6 kg/m.  General Appearance: Casual  Eye Contact:  Good  Speech:  Clear and Coherent and Normal Rate  Volume:  Normal  Mood:  Euthymic  Affect:  Congruent  Thought Process:  Goal Directed and Descriptions of  Associations: Intact  Orientation:  Full (Time, Place, and Person)  Thought Content:  WDL  Suicidal  Thoughts:  No  Homicidal Thoughts:  No  Memory:  Immediate;   Good Recent;   Good Remote;   Good  Judgement:  Fair  Insight:  Fair  Psychomotor Activity:  Normal  Concentration:  Concentration: Good and Attention Span: Good  Recall:  Good  Fund of Knowledge:  Good  Language:  Good  Akathisia:  No  Handed:  Right  AIMS (if indicated):     Assets:  Communication Skills Desire for Improvement Financial Resources/Insurance Housing Physical Health Social Support Transportation  ADL's:  Intact  Cognition:  WNL  Sleep:  Number of Hours: 3.5   Problems Addressed: Bipolar I Alcohol Dependence  Treatment Plan Summary: Daily contact with patient to assess and evaluate symptoms and progress in treatment, Medication management and Plan is to:  -Increase Seroquel 150 mg PO QHS for mood stability -Increase Neurontin 300 mg PO TID for withdrawal symptoms -Stop Prazosin -Continue Prozac 20 mg PO Daily for mood stability -Continue Vistaril 25 mg PO Q6H PRN for anxiety -Encourage group therapy participation  Maryfrances Bunnell, FNP 02/25/2018, 2:18 PM

## 2018-02-25 NOTE — Progress Notes (Signed)
[facility] Group Notes:  (Nursing/MHT/Case Management/Adjunct)  Date:  02/25/2018  Time:  2100  Type of Therapy:  wrap up group  Participation Level:  Active  Participation Quality:  Attentive and Sharing  Affect:  Angry, Irritable and Labile  Cognitive:  Alert  Insight:  Improving  Engagement in Group:  Engaged  Modes of Intervention:  Clarification, Education and Support  Summary of Progress/Problems: Pt shared that it was good that he didn't flip out today after being told he could not sign himself out even though he was here voluntarily. As pt is sharing this in group he is getting increasingly angry and even more angry when I asked if I could explain why. Pt then shared that he did not even want to go just didn't want to hear that he was not allowed. Pt clearly experiencing withdraw agitation and would periodically get up to stand, unable to sit for long.  Pt did shared that he has reconnected with his sister and that is a real positive in his life. Pt is worried about his girlfriend because he hasn't heard from her. [facility]   Marcille BuffyMcNeil, Tashira Torre S 02/25/2018, 10:20 PM

## 2018-02-25 NOTE — Progress Notes (Signed)
Patient ID: Steven Daniels, male   DOB: 09/01/1961, 57 y.o.   MRN: [id number]  Pt currently presents with an anxious affect and restless behavior. Pt reports ongoing anxiety and feelings of being "manic" tonight. Reports that he is currently experiencing pain in his "prostate" that started when he got back from the gym. Reports chafing. Pt rescinded 72 hour form today. Ambivalence about stay currently. Pt states "I'm sorry I am such a trouble." Pt reports poor sleep with current medication regimen.   Pt provided with medications per providers orders. Pt's labs and vitals were monitored throughout the night. Pt given a 1:1 about emotional and mental status. Pt supported and encouraged to express concerns and questions. Pt educated on medications and anxiety relief techniques.   Pt's safety ensured with 15 minute and environmental checks. Pt currently denies SI/HI and A/V hallucinations. Pt verbally agrees to seek staff if SI/HI or A/VH occurs and to consult with staff before acting on any harmful thoughts. Will continue POC.

## 2018-02-26 MED ORDER — ACAMPROSATE CALCIUM 333 MG PO TBEC
666.0000 mg | DELAYED_RELEASE_TABLET | Freq: Three times a day (TID) | ORAL | Status: DC
  Administered 2018-02-26 – 2018-03-01 (×9): 666 mg via ORAL
  Filled 2018-02-26 (×4): qty 2
  Filled 2018-02-26: qty 84
  Filled 2018-02-26: qty 2
  Filled 2018-02-26: qty 84
  Filled 2018-02-26: qty 2
  Filled 2018-02-26: qty 84
  Filled 2018-02-26: qty 2
  Filled 2018-02-26 (×2): qty 84
  Filled 2018-02-26: qty 2
  Filled 2018-02-26: qty 84
  Filled 2018-02-26 (×2): qty 2

## 2018-02-26 NOTE — Progress Notes (Signed)
[facility] MD Progress Note  02/26/2018 2:40 PM Steven Daniels  MRN:  [id number]   Subjective:  Patient states that he thinks he has found the reason for feeling agitated and kind of anxious. "I'm not a big soda drinker and I have been drinking soda every time we go eat. I think it may be the caffeine." He reports he is worried about his girlfriend and she has his phone, car, and his keys and he has not heard from her since yesterday morning. She has just released form a 90 day rehab and she was on her way to a know drug use area. He states he realizes he needs to stay here, but he is concerned about her. He reports having severe cravings for alcohol. He denies any SI/HI/AVH and contracts for safety.   Objective: Patient's chart and findings reviewed and discussed with treatment team. Patient is pleasant and cooperative. Patinet is talkative today and shares more information and has logical future oriented thinking today. He has been in the day room interacting with peers and staff appropriately. Will start Campral 666 mg PO TID for cravings. Also encourage patient to avoid caffeine.     Principal Problem: Bipolar 1 disorder, depressed, severe (HCC) Diagnosis:   Patient Active Problem List   Diagnosis Date Noted  . Alcohol abuse with alcohol-induced mood disorder (HCC) [F10.14] 02/19/2018  . Bipolar 1 disorder, depressed, severe (HCC) [F31.4] 02/19/2018  . Chronic hepatitis C without hepatic coma (HCC) [B18.2] 12/29/2017  . Polysubstance abuse (HCC) [F19.10] 12/29/2017  . IVDU (intravenous drug user) [F19.90] 12/29/2017  . Chronic pain [G89.29] 12/29/2017  . Cigarette smoker [F17.210] 12/29/2017  . Hearing loss [H91.90] 12/29/2017  . H/O total hip arthroplasty, right [O13.086] 12/29/2017  . Cocaine abuse (HCC) [F14.10] 12/09/2017  . Bipolar affective disorder, current episode mixed (HCC) [F31.60] 03/14/2016  . Alcohol dependence (HCC) [F10.20] 12/29/2015  . Severe recurrent major depression without  psychotic features (HCC) [F33.2] 12/29/2015   Total Time spent with patient: 30 minutes  Past Psychiatric History: See H&P  Past Medical History:  Past Medical History:  Diagnosis Date  . Alcohol abuse   . Bipolar affective disorder (HCC)   . Depression   . Hard of hearing   . Heart murmur    per pt 12/28/15  . Hypertension     Past Surgical History:  Procedure Laterality Date  . HIP FRACTURE SURGERY Right    x 3  . KNEE SURGERY Right    Family History:  Family History  Problem Relation Age of Onset  . Other Mother        brain tumor behind left ear  . Alzheimer's disease Mother   . Skin cancer Father        melatomia  . Breast cancer Maternal Grandmother   . Heart disease Paternal Uncle   . Heart disease Cousin        fathers side  . Colon cancer Neg Hx   . Esophageal cancer Neg Hx    Family Psychiatric  History: See H&P Social History:  Social History   Substance and Sexual Activity  Alcohol Use No  . Frequency: Never   Comment: 5 forties daily, drank 2 today     Social History   Substance and Sexual Activity  Drug Use Yes  . Types: Cocaine   Comment: crack, heroin -snort last use 12/27/2017    Social History   Socioeconomic History  . Marital status: Single    Spouse name: Not on file  .  Number of children: 0  . Years of education: Not on file  . Highest education level: Not on file  Occupational History  . Occupation: disabled  Social Needs  . Financial resource strain: Not on file  . Food insecurity:    Worry: Not on file    Inability: Not on file  . Transportation needs:    Medical: Not on file    Non-medical: Not on file  Tobacco Use  . Smoking status: Current Every Day Smoker    Packs/day: 1.00    Types: Cigarettes  . Smokeless tobacco: Never Used  Substance and Sexual Activity  . Alcohol use: No    Frequency: Never    Comment: 5 forties daily, drank 2 today  . Drug use: Yes    Types: Cocaine    Comment: crack, heroin -snort  last use 12/27/2017  . Sexual activity: Not Currently    Birth control/protection: None  Lifestyle  . Physical activity:    Days per week: Not on file    Minutes per session: Not on file  . Stress: Not on file  Relationships  . Social connections:    Talks on phone: Not on file    Gets together: Not on file    Attends religious service: Not on file    Active member of club or organization: Not on file    Attends meetings of clubs or organizations: Not on file    Relationship status: Not on file  Other Topics Concern  . Not on file  Social History Narrative   Divorced, no children   Veteran of 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  States intention to quit currently started opiates after her first hip surgery   He is a smoker   11/23/2017      Additional Social History:    Pain Medications: per report has history of abusing pain pills Prescriptions: denies Over the Counter: denies History of alcohol / drug use?: Yes Longest period of sobriety (when/how long): unsure Negative Consequences of Use: Financial, Personal relationships Withdrawal Symptoms: Sweats, Tremors Onset of Seizures: unknown Date of most recent seizure: unknown Name of Substance 1: alcohol 1 - Age of First Use: 18 1 - Amount (size/oz): (5) forty ounce beers 1 - Frequency: daily 1 - Duration: ongoing 1 - Last Use / Amount: yesterday Name of Substance 2: cocain 2 - Age of First Use: unsure 2 - Amount (size/oz): approximately $200 worth 2 - Frequency: daily  2 - Duration: on-going 2 - Last Use / Amount: yesterday                Sleep: Fair  Appetite:  Good  Current Medications: Current Facility-Administered Medications  Medication Dose Route Frequency Provider Last Rate Last Dose  . acamprosate (CAMPRAL) tablet 666 mg  666 mg Oral TID WC Erving Sassano, Gerlene Burdock, FNP   666  mg at 02/26/18 1152  . alum & mag hydroxide-simeth (MAALOX/MYLANTA) 200-200-20 MG/5ML suspension 30 mL  30 mL Oral Q4H PRN Nira Conn A, NP      . amLODipine (NORVASC) tablet 10 mg  10 mg Oral Daily Antonieta Pert, MD   10 mg at 02/26/18 1610  . feeding supplement (ENSURE ENLIVE) (ENSURE ENLIVE) liquid 237 mL  237 mL Oral Daily PRN Cobos, Rockey Situ, MD      . FLUoxetine (PROZAC) capsule  20 mg  20 mg Oral Daily Antonieta Pertlary, Greg Lawson, MD   20 mg at 02/26/18 0815  . gabapentin (NEURONTIN) capsule 300 mg  300 mg Oral TID Jermanie Minshall, Feliz Beamravis B, FNP   300 mg at 02/26/18 1152  . hydrOXYzine (ATARAX/VISTARIL) tablet 25 mg  25 mg Oral Q6H PRN Armandina StammerNwoko, Agnes I, NP   25 mg at 02/25/18 1344  . ibuprofen (ADVIL,MOTRIN) tablet 600 mg  600 mg Oral Q6H PRN Nira ConnBerry, Jason A, NP   600 mg at 02/26/18 1152  . magnesium hydroxide (MILK OF MAGNESIA) suspension 30 mL  30 mL Oral Daily PRN Nira ConnBerry, Jason A, NP   30 mL at 02/21/18 1703  . multivitamin with minerals tablet 1 tablet  1 tablet Oral Daily Nira ConnBerry, Jason A, NP   1 tablet at 02/26/18 0814  . nicotine (NICODERM CQ - dosed in mg/24 hours) patch 21 mg  21 mg Transdermal Daily Cobos, Rockey SituFernando A, MD   21 mg at 02/26/18 0818  . potassium chloride SA (K-DUR,KLOR-CON) CR tablet 20 mEq  20 mEq Oral Daily Nira ConnBerry, Jason A, NP   20 mEq at 02/26/18 0815  . QUEtiapine (SEROQUEL) tablet 150 mg  150 mg Oral QHS Alondria Mousseau, Gerlene Burdockravis B, FNP   150 mg at 02/25/18 2133  . thiamine (VITAMIN B-1) tablet 100 mg  100 mg Oral Daily Nira ConnBerry, Jason A, NP   100 mg at 02/26/18 [id number]    Lab Results: No results found for this or any previous visit (from the past 48 hour(s)).  Blood Alcohol level:  Lab Results  Component Value Date   ETH 28 (H) 02/18/2018   ETH 101 (H) 09/05/2017    Metabolic Disorder Labs: Lab Results  Component Value Date   HGBA1C 5.6 02/20/2018   MPG 114.02 02/20/2018   MPG 126 12/30/2015   No results found for: PROLACTIN Lab Results  Component Value Date   CHOL 192 02/20/2018    TRIG 142 02/20/2018   HDL 79 02/20/2018   CHOLHDL 2.4 02/20/2018   VLDL 28 02/20/2018   LDLCALC 85 02/20/2018   LDLCALC 124 (H) 12/30/2015    Physical Findings: AIMS: Facial and Oral Movements Muscles of Facial Expression: None, normal Lips and Perioral Area: None, normal Jaw: None, normal Tongue: None, normal,Extremity Movements Upper (arms, wrists, hands, fingers): None, normal Lower (legs, knees, ankles, toes): None, normal, Trunk Movements Neck, shoulders, hips: None, normal, Overall Severity Severity of abnormal movements (highest score from questions above): None, normal Incapacitation due to abnormal movements: None, normal Patient's awareness of abnormal movements (rate only patient's report): No Awareness, Dental Status Current problems with teeth and/or dentures?: No Does patient usually wear dentures?: No  CIWA:  CIWA-Ar Total: 3 COWS:  COWS Total Score: 7  Musculoskeletal: Strength & Muscle Tone: within normal limits Gait & Station: normal Patient leans: N/A  Psychiatric Specialty Exam: Physical Exam  Nursing note and vitals reviewed. Constitutional: He is oriented to person, place, and time. He appears well-developed and well-nourished.  Cardiovascular: Normal rate.  Respiratory: Effort normal.  Musculoskeletal: Normal range of motion.  Neurological: He is alert and oriented to person, place, and time.  Skin: Skin is warm.    Review of Systems  Constitutional: Negative.   HENT: Negative.   Eyes: Negative.   Respiratory: Negative.   Cardiovascular: Negative.   Gastrointestinal: Negative.   Genitourinary: Negative.   Musculoskeletal: Negative.   Skin: Negative.   Neurological: Negative.   Endo/Heme/Allergies: Negative.   Psychiatric/Behavioral: Positive for depression and substance abuse.  Negative for hallucinations and suicidal ideas. The patient is nervous/anxious and has insomnia.     Blood pressure 130/86, pulse 93, temperature 98.5 F (36.9  C), resp. rate 16, height 6\' 2"  (1.88 m), weight 97.5 kg (215 lb).Body mass index is 27.6 kg/m.  General Appearance: Casual  Eye Contact:  Good  Speech:  Clear and Coherent and Normal Rate  Volume:  Normal  Mood:  Anxious  Affect:  Congruent  Thought Process:  Goal Directed and Descriptions of Associations: Intact  Orientation:  Full (Time, Place, and Person)  Thought Content:  WDL  Suicidal Thoughts:  No  Homicidal Thoughts:  No  Memory:  Immediate;   Good Recent;   Good Remote;   Good  Judgement:  Fair  Insight:  Fair  Psychomotor Activity:  Normal  Concentration:  Concentration: Good and Attention Span: Good  Recall:  Good  Fund of Knowledge:  Good  Language:  Good  Akathisia:  No  Handed:  Right  AIMS (if indicated):     Assets:  Communication Skills Desire for Improvement Financial Resources/Insurance Housing Physical Health Social Support Transportation  ADL's:  Intact  Cognition:  WNL  Sleep:  Number of Hours: 5.25   Problems Addressed: Bipolar I Alcohol Dependence  Treatment Plan Summary: Daily contact with patient to assess and evaluate symptoms and progress in treatment, Medication management and Plan is to:  -Continue Seroquel 150 mg PO QHS for mood stability -Continue Neurontin 300 mg PO TID for withdrawal symptoms -Start Campral 666 mg PO TID for alcohol cravings -Continue Prozac 20 mg PO Daily for mood stability -Continue Vistaril 25 mg PO Q6H PRN for anxiety -Encourage group therapy participation  Maryfrances Bunnell, FNP 02/26/2018, 2:40 PM

## 2018-02-26 NOTE — [facility] Group Notes (Signed)
[facility] Group Notes:  (Nursing/MHT/Case Management/Adjunct)  Date:  02/26/2018  Time:  1330  Type of Therapy:  Nurse Education - Suicide/Recovery Safety Plan Completion  Participation Level:  Active  Participation Quality:  Attentive and Sharing  Affect:  Animated  Cognitive:  Alert  Insight:  Improving  Engagement in Group:  Improving  Modes of Intervention:  Discussion, Education and Support  Summary of Progress/Problems: Patient attended and participated in group discussion and completed SSP. Patient talked at length about his SO and his worry that she is currently unsafe as patient has not heard from her. Receptive of information/education provided and contributed to the discussion. Supportive to peers.  Merian CapronFriedman, Shaquel Chavous [facility] 02/26/2018, 6:30 PM

## 2018-02-26 NOTE — [facility] Group Notes (Signed)
[facility] LCSW Group Therapy Note  Date/Time:  02/26/2018 9:00  Type of Therapy and Topic:  Group Therapy:  Healthy and Unhealthy Supports  Participation Level:  Minimal   Description of Group:  Patients in this group were introduced to the idea of adding a variety of healthy supports to address the various needs in their lives.Patients discussed what additional healthy supports could be helpful in their recovery and wellness after discharge in order to prevent future hospitalizations.   An emphasis was placed on using counselor, doctor, therapy groups, 12-step groups, and problem-specific support groups to expand supports.  They also worked as a group on developing a specific plan for several patients to deal with unhealthy supports through boundary-setting, psychoeducation with loved ones, and even termination of relationships.   Therapeutic Goals:              1)  discuss importance of adding supports to stay well once out of the hospital             2)  compare healthy versus unhealthy supports and identify some examples of each             3)  generate ideas and descriptions of healthy supports that can be added             4)  offer mutual support about how to address unhealthy supports             5)  encourage active participation in and adherence to discharge plan               Summary of Patient Progress:   Pt continues to work towards their tx goals but has not yet reached them. Pt was able to appropriately participate in group discussion, and was able to offer support/validation to other group members. Pt stated he is feeling, "okay today." Pt stated he does not have many positive supports in the community, "my girl kind of dropped the ball." Pt stated, "I have a problem setting boundaries in an appropriate way. I either give too much or I go off." Pt stated he intends to work on setting better boundaries in the community in order to improve healthy supports. Pt stated, "I still have my frat  brothers and some motorcycle guys I can depend on."   Therapeutic Modalities:   Motivational Interviewing Brief Solution-Focused Therapy  Heidi DachKelsey Leshea Jaggers, MSW, LCSW 02/26/2018 9:58 AM

## 2018-02-26 NOTE — Progress Notes (Signed)
D. Pt presents with an anxious affect and congruent behavior- friendly and appropriate during interactions. Pt reports sleeping poorly last night, despite having sleeping medication. Pt observed interacting appropriately with peers in dayroom throughout am. Per pt's self inventory, pt rates his depression, hopelessness and anxiety a 4/4/7-8, respectively. Pt endorsing head and prostate discomfort-  Pt currently denies SI/HI and AV hallucinations. A. Labs and vitals monitored. Pt compliant with scheduled and prn meds Pt supported emotionally and encouraged to express concerns and ask questions.   R. Pt remains safe with 15 minute checks. Will continue POC.

## 2018-02-27 MED ORDER — QUETIAPINE FUMARATE 200 MG PO TABS
200.0000 mg | ORAL_TABLET | Freq: Every day | ORAL | Status: DC
  Administered 2018-02-27 – 2018-02-28 (×2): 200 mg via ORAL
  Filled 2018-02-27: qty 1
  Filled 2018-02-27 (×2): qty 14
  Filled 2018-02-27: qty 1

## 2018-02-27 NOTE — Progress Notes (Signed)
Recreation Therapy Notes  Date: 3.29.19 Time: 9:30 a.m. Location: [address] Dayroom   Group Topic: Stress Management   Goal Area(s) Addresses:  Goal 1.1: To reduce stress  -Patient will report feeling a reduction in stress level  -Patient will identify the importance of stress management  -Patient will participate during stress management group treatment     Intervention: Stress Management   Activity: Guided Imagery- Patients were in a peaceful environment with soft lighting enhancing patients mood. Patients were read a guided imagery script to help decrease stress levels   Education: Stress Management, Discharge Planning.    Education Outcome: Acknowledges edcuation/In group clarification offered/Needs additional education   Clinical Observations/Feedback:: Patient did not attend     Steven Daniels, Recreation Therapy Intern   Steven Daniels 02/27/2018 9:11 AM

## 2018-02-27 NOTE — Progress Notes (Signed)
Psychoeducational Group Note  Date:  02/27/2018 Time:  2330  Group Topic/Focus:  Wrap-Up Group:   The focus of this group is to help patients review their daily goal of treatment and discuss progress on daily workbooks.  Participation Level: Did Not Attend  Participation Quality:  Not Applicable  Affect:  Not Applicable  Cognitive:  Not Applicable  Insight:  Not Applicable  Engagement in Group: Not Applicable  Additional Comments:  The patient politely refused to attend the A.A.meeting.   Rori Goar S 02/27/2018, 11:30 PM

## 2018-02-27 NOTE — Progress Notes (Addendum)
  [facility] :  Will you be returning to the same living situation after discharge:  No. [facility] for [facility].  At discharge, do you have transportation home?: Yes,  friend--DISCHARGE RESCHEDULED FOR WED MORNING PER MD Do you have the ability to pay for your medications: Yes,  [facility]  Release of information consent forms completed and submitted to medical records by CSW.  Patient to Follow up at: Follow-up Information    [facility] Follow up on 03/01/2018.   Why:  Screening for possible admission on Wed, 03/01/18 at 8:00AM. Please bring: proof of [place] residency/photo ID and clothing. Thank you.  Contact information: [address] [place] [address] [phone]        [facility] Follow up.   Specialty:  Behavioral Health Why:  Walk in within 3 days of hospital/rehab discharge to be assessed for outpatient mental health services including Medication management and counseling. Walk in hours: Mon-Fri 8am-9am. Thank you.  Contact information: [address] [place] [address] [phone]           Next level of care provider has access to [facility] Link:no  Safety Planning and Suicide Prevention discussed: Yes,  SPE completed with pt's girlfriend. SPI pamphlet and Mobile Crisis information provided.  Have you used any form of tobacco in the last 30 days? (Cigarettes, Smokeless Tobacco, Cigars, and/or Pipes): Yes  Has patient been referred to the Quitline?: Patient refused referral  Patient has been referred for addiction treatment: Yes  Pulte HomesHeather N Smart, LCSW 02/27/2018, 9:50 AM

## 2018-02-27 NOTE — Progress Notes (Signed)
[facility] MD Progress Note  02/27/2018 11:02 AM Steven CuriaClay Daniels  MRN:  [id number]   Subjective:  Patient reports that he is doing good today. He still has some minor depression, but has been doing good. He was able to get in touch with his girlfriend and she had went out and used drugs again and that is why she wasn't available. He is still wanting to go to [facility] on Wednesday. He is concerned that he may relapse if discharged home because he is upset with his girlfriend relapsing and she just discharged from a 90 day program. He is afraid that he may relapse as well. He also reports that the Campral "is amazing" and he denies any cravings at this time. He denies any medication side effects. He denies any SI/HI/AVh and contracts for safety.  Objective: Patient's chart and findings reviewed and discussed with treatment team. Patient has been attending group and participating. He has been interacting with peers and staff appropriately. There was some discussion about mild depression and disrupted sleep, so will increase Seroquel to 200 mg QHS. Will continue all other medications. Patient is planned to discharge Wednesday morning to go [facility].      Principal Problem: Bipolar 1 disorder, depressed, severe (HCC) Diagnosis:   Patient Active Problem List   Diagnosis Date Noted  . Alcohol abuse with alcohol-induced mood disorder (HCC) [F10.14] 02/19/2018  . Bipolar 1 disorder, depressed, severe (HCC) [F31.4] 02/19/2018  . Chronic hepatitis C without hepatic coma (HCC) [B18.2] 12/29/2017  . Polysubstance abuse (HCC) [F19.10] 12/29/2017  . IVDU (intravenous drug user) [F19.90] 12/29/2017  . Chronic pain [G89.29] 12/29/2017  . Cigarette smoker [F17.210] 12/29/2017  . Hearing loss [H91.90] 12/29/2017  . H/O total hip arthroplasty, right [W09.811][Z96.641] 12/29/2017  . Cocaine abuse (HCC) [F14.10] 12/09/2017  . Bipolar affective disorder, current episode mixed (HCC) [F31.60] 03/14/2016  . Alcohol  dependence (HCC) [F10.20] 12/29/2015  . Severe recurrent major depression without psychotic features (HCC) [F33.2] 12/29/2015   Total Time spent with patient: 30 minutes  Past Psychiatric History: See H&P  Past Medical History:  Past Medical History:  Diagnosis Date  . Alcohol abuse   . Bipolar affective disorder (HCC)   . Depression   . Hard of hearing   . Heart murmur    per pt 12/28/15  . Hypertension     Past Surgical History:  Procedure Laterality Date  . HIP FRACTURE SURGERY Right    x 3  . KNEE SURGERY Right    Family History:  Family History  Problem Relation Age of Onset  . Other Mother        brain tumor behind left ear  . Alzheimer's disease Mother   . Skin cancer Father        melatomia  . Breast cancer Maternal Grandmother   . Heart disease Paternal Uncle   . Heart disease Cousin        fathers side  . Colon cancer Neg Hx   . Esophageal cancer Neg Hx    Family Psychiatric  History: See H&P Social History:  Social History   Substance and Sexual Activity  Alcohol Use No  . Frequency: Never   Comment: 5 forties daily, drank 2 today     Social History   Substance and Sexual Activity  Drug Use Yes  . Types: Cocaine   Comment: crack, heroin -snort last use 12/27/2017    Social History   Socioeconomic History  . Marital status: Single  Spouse name: Not on file  . Number of children: 0  . Years of education: Not on file  . Highest education level: Not on file  Occupational History  . Occupation: disabled  Social Needs  . Financial resource strain: Not on file  . Food insecurity:    Worry: Not on file    Inability: Not on file  . Transportation needs:    Medical: Not on file    Non-medical: Not on file  Tobacco Use  . Smoking status: Current Every Day Smoker    Packs/day: 1.00    Types: Cigarettes  . Smokeless tobacco: Never Used  Substance and Sexual Activity  . Alcohol use: No    Frequency: Never    Comment: 5 forties daily, drank  2 today  . Drug use: Yes    Types: Cocaine    Comment: crack, heroin -snort last use 12/27/2017  . Sexual activity: Not Currently    Birth control/protection: None  Lifestyle  . Physical activity:    Days per week: Not on file    Minutes per session: Not on file  . Stress: Not on file  Relationships  . Social connections:    Talks on phone: Not on file    Gets together: Not on file    Attends religious service: Not on file    Active member of club or organization: Not on file    Attends meetings of clubs or organizations: Not on file    Relationship status: Not on file  Other Topics Concern  . Not on file  Social History Narrative   Divorced, no children   Veteran of 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  States intention to quit currently started opiates after her first hip surgery   He is a smoker   11/23/2017      Additional Social History:    Pain Medications: per report has history of abusing pain pills Prescriptions: denies Over the Counter: denies History of alcohol / drug use?: Yes Longest period of sobriety (when/how long): unsure Negative Consequences of Use: Financial, Personal relationships Withdrawal Symptoms: Sweats, Tremors Onset of Seizures: unknown Date of most recent seizure: unknown Name of Substance 1: alcohol 1 - Age of First Use: 18 1 - Amount (size/oz): (5) forty ounce beers 1 - Frequency: daily 1 - Duration: ongoing 1 - Last Use / Amount: yesterday Name of Substance 2: cocain 2 - Age of First Use: unsure 2 - Amount (size/oz): approximately $200 worth 2 - Frequency: daily  2 - Duration: on-going 2 - Last Use / Amount: yesterday                Sleep: Fair  Appetite:  Good  Current Medications: Current Facility-Administered Medications  Medication Dose Route Frequency Provider Last Rate Last Dose  .  acamprosate (CAMPRAL) tablet 666 mg  666 mg Oral TID WC Money, Gerlene Burdock, FNP   666 mg at 02/27/18 0606  . alum & mag hydroxide-simeth (MAALOX/MYLANTA) 200-200-20 MG/5ML suspension 30 mL  30 mL Oral Q4H PRN Nira Conn A, NP      . amLODipine (NORVASC) tablet 10 mg  10 mg Oral Daily Antonieta Pert, MD   10 mg at 02/27/18 [phone]  . feeding supplement (ENSURE ENLIVE) (ENSURE ENLIVE) liquid 237 mL  237 mL Oral Daily PRN Cobos, Rockey Situ, MD      .  FLUoxetine (PROZAC) capsule 20 mg  20 mg Oral Daily Antonieta Pert, MD   20 mg at 02/27/18 [phone]  . gabapentin (NEURONTIN) capsule 300 mg  300 mg Oral TID Money, Gerlene Burdock, FNP   300 mg at 02/27/18 6433  . hydrOXYzine (ATARAX/VISTARIL) tablet 25 mg  25 mg Oral Q6H PRN Armandina Stammer I, NP   25 mg at 02/26/18 2141  . ibuprofen (ADVIL,MOTRIN) tablet 600 mg  600 mg Oral Q6H PRN Nira Conn A, NP   600 mg at 02/26/18 2142  . magnesium hydroxide (MILK OF MAGNESIA) suspension 30 mL  30 mL Oral Daily PRN Nira Conn A, NP   30 mL at 02/21/18 1703  . multivitamin with minerals tablet 1 tablet  1 tablet Oral Daily Nira Conn A, NP   1 tablet at 02/27/18 [phone]  . nicotine (NICODERM CQ - dosed in mg/24 hours) patch 21 mg  21 mg Transdermal Daily Cobos, Rockey Situ, MD   21 mg at 02/27/18 0841  . potassium chloride SA (K-DUR,KLOR-CON) CR tablet 20 mEq  20 mEq Oral Daily Nira Conn A, NP   20 mEq at 02/27/18 0839  . QUEtiapine (SEROQUEL) tablet 150 mg  150 mg Oral QHS Money, Gerlene Burdock, FNP   150 mg at 02/26/18 2139  . thiamine (VITAMIN B-1) tablet 100 mg  100 mg Oral Daily Nira Conn A, NP   100 mg at 02/27/18 8841    Lab Results: No results found for this or any previous visit (from the past 48 hour(s)).  Blood Alcohol level:  Lab Results  Component Value Date   ETH 28 (H) 02/18/2018   ETH 101 (H) 09/05/2017    Metabolic Disorder Labs: Lab Results  Component Value Date   HGBA1C 5.6 02/20/2018   MPG 114.02 02/20/2018   MPG 126 12/30/2015   No  results found for: PROLACTIN Lab Results  Component Value Date   CHOL 192 02/20/2018   TRIG 142 02/20/2018   HDL 79 02/20/2018   CHOLHDL 2.4 02/20/2018   VLDL 28 02/20/2018   LDLCALC 85 02/20/2018   LDLCALC 124 (H) 12/30/2015    Physical Findings: AIMS: Facial and Oral Movements Muscles of Facial Expression: None, normal Lips and Perioral Area: None, normal Jaw: None, normal Tongue: None, normal,Extremity Movements Upper (arms, wrists, hands, fingers): None, normal Lower (legs, knees, ankles, toes): None, normal, Trunk Movements Neck, shoulders, hips: None, normal, Overall Severity Severity of abnormal movements (highest score from questions above): None, normal Incapacitation due to abnormal movements: None, normal Patient's awareness of abnormal movements (rate only patient's report): No Awareness, Dental Status Current problems with teeth and/or dentures?: No Does patient usually wear dentures?: No  CIWA:  CIWA-Ar Total: 3 COWS:  COWS Total Score: 7  Musculoskeletal: Strength & Muscle Tone: within normal limits Gait & Station: normal Patient leans: N/A  Psychiatric Specialty Exam: Physical Exam  Nursing note and vitals reviewed. Constitutional: He is oriented to person, place, and time. He appears well-developed and well-nourished.  Cardiovascular: Normal rate.  Respiratory: Effort normal.  Musculoskeletal: Normal range of motion.  Neurological: He is alert and oriented to person, place, and time.  Skin: Skin is warm.    Review of Systems  Constitutional: Negative.   HENT: Negative.   Eyes: Negative.   Respiratory: Negative.   Cardiovascular: Negative.   Gastrointestinal: Negative.   Genitourinary: Negative.   Musculoskeletal: Negative.   Skin: Negative.   Neurological: Negative.   Endo/Heme/Allergies: Negative.   Psychiatric/Behavioral: Positive for depression (  mild) and substance abuse. Negative for hallucinations and suicidal ideas. The patient is  nervous/anxious (mild) and has insomnia (improving).     Blood pressure 105/73, pulse 91, temperature 98.7 F (37.1 C), temperature source Oral, resp. rate 20, height 6\' 2"  (1.88 m), weight 97.5 kg (215 lb).Body mass index is 27.6 kg/m.  General Appearance: Casual  Eye Contact:  Good  Speech:  Clear and Coherent and Normal Rate  Volume:  Normal  Mood:  Euthymic  Affect:  Congruent  Thought Process:  Goal Directed and Descriptions of Associations: Intact  Orientation:  Full (Time, Place, and Person)  Thought Content:  WDL  Suicidal Thoughts:  No  Homicidal Thoughts:  No  Memory:  Immediate;   Good Recent;   Good Remote;   Good  Judgement:  Fair  Insight:  Fair  Psychomotor Activity:  Normal  Concentration:  Concentration: Good and Attention Span: Good  Recall:  Good  Fund of Knowledge:  Good  Language:  Good  Akathisia:  No  Handed:  Right  AIMS (if indicated):     Assets:  Communication Skills Desire for Improvement Financial Resources/Insurance Housing Physical Health Social Support Transportation  ADL's:  Intact  Cognition:  WNL  Sleep:  Number of Hours: 6   Problems Addressed: Bipolar I Alcohol Dependence  Treatment Plan Summary: Daily contact with patient to assess and evaluate symptoms and progress in treatment, Medication management and Plan is to:  -Continue Seroquel 150 mg PO QHS for mood stability -Continue Neurontin 300 mg PO TID for withdrawal symptoms -Continue Campral 666 mg PO TID for alcohol cravings -Continue Prozac 20 mg PO Daily for mood stability -Continue Vistaril 25 mg PO Q6H PRN for anxiety -Encourage group therapy participation  Maryfrances Bunnell, FNP 02/27/2018, 11:02 AM

## 2018-02-27 NOTE — Plan of Care (Signed)
  Problem: Education: Goal: Knowledge of  General Education information/materials will improve Outcome: Progressing Goal: Emotional status will improve Outcome: Progressing Goal: Mental status will improve Outcome: Progressing Goal: Verbalization of understanding the information provided will improve Outcome: Progressing   

## 2018-02-27 NOTE — Progress Notes (Signed)
D . Pt pleasant on approach, denies complaints at this time.  Pt was positive for evening AA group, observed engaged in appropriate interaction with peers on the unit.  Pt does report pain in prostate and requested medication for this (see pain flowsheet).  Pt denies SI/HI/AVH at this time.  Pt is to discharge tomorrow and go to [facility].  A.  Support and encouragement offered, medication given as ordered . R.  Pt remains safe on the unit, will continue to monitor.

## 2018-02-27 NOTE — Tx Team (Addendum)
Interdisciplinary Treatment and Diagnostic Plan Update  02/27/2018 Time of Session: 9:50am [name] MRN: [id number]  Principal Diagnosis: Bipolar 1 disorder, depressed, severe (HCC)  Secondary Diagnoses: Principal Problem:   Bipolar 1 disorder, depressed, severe (HCC) Active Problems:   Alcohol dependence (HCC)   Current Medications:  Current Facility-Administered Medications  Medication Dose Route Frequency Provider Last Rate Last Dose  . acamprosate (CAMPRAL) tablet 666 mg  666 mg Oral TID WC Money, Gerlene Burdock, FNP   666 mg at 02/27/18 0606  . alum & mag hydroxide-simeth (MAALOX/MYLANTA) 200-200-20 MG/5ML suspension 30 mL  30 mL Oral Q4H PRN Nira Conn A, NP      . amLODipine (NORVASC) tablet 10 mg  10 mg Oral Daily Antonieta Pert, MD   10 mg at 02/27/18 [phone]  . feeding supplement (ENSURE ENLIVE) (ENSURE ENLIVE) liquid 237 mL  237 mL Oral Daily PRN Cobos, Fernando A, MD      . FLUoxetine (PROZAC) capsule 20 mg  20 mg Oral Daily Antonieta Pert, MD   20 mg at 02/27/18 [phone]  . gabapentin (NEURONTIN) capsule 300 mg  300 mg Oral TID Money, Gerlene Burdock, FNP   300 mg at 02/27/18 4782  . hydrOXYzine (ATARAX/VISTARIL) tablet 25 mg  25 mg Oral Q6H PRN Armandina Stammer I, NP   25 mg at 02/26/18 2141  . ibuprofen (ADVIL,MOTRIN) tablet 600 mg  600 mg Oral Q6H PRN Nira Conn A, NP   600 mg at 02/26/18 2142  . magnesium hydroxide (MILK OF MAGNESIA) suspension 30 mL  30 mL Oral Daily PRN Nira Conn A, NP   30 mL at 02/21/18 1703  . multivitamin with minerals tablet 1 tablet  1 tablet Oral Daily Nira Conn A, NP   1 tablet at 02/27/18 [phone]  . nicotine (NICODERM CQ - dosed in mg/24 hours) patch 21 mg  21 mg Transdermal Daily Cobos, Rockey Situ, MD   21 mg at 02/27/18 0841  . potassium chloride SA (K-DUR,KLOR-CON) CR tablet 20 mEq  20 mEq Oral Daily Nira Conn A, NP   20 mEq at 02/27/18 0839  . QUEtiapine (SEROQUEL) tablet 150 mg  150 mg Oral QHS Money, Gerlene Burdock, FNP   150 mg at 02/26/18 2139   . thiamine (VITAMIN B-1) tablet 100 mg  100 mg Oral Daily Nira Conn A, NP   100 mg at 02/27/18 1308   PTA Medications: Medications Prior to Admission  Medication Sig Dispense Refill Last Dose  . amLODipine (NORVASC) 5 MG tablet Take 5 mg by mouth daily.  1 Past Month at Unknown time  . LORazepam (ATIVAN) 1 MG tablet Take 1 tablet (1 mg total) by mouth See admin instructions. Take 2 mg (2 tablets) by mouth every 4 hours for 6 doses, then take 2 mg (2 tablets) by mouth every  6 hours for 4 doses, then 1 mg (1 tablet) every 6 hours for 8 additional doses (Patient not taking: Reported on 12/29/2017) 28 tablet 0 Not Taking at Unknown time  . Potassium Chloride ER 20 MEQ TBCR Take 20 tablets by mouth daily. (Patient taking differently: Take 20 mEq by mouth daily. ) 30 tablet 1 Past Month at Unknown time  . propranolol ER (INDERAL LA) 80 MG 24 hr capsule Take 80 mg by mouth daily.   Past Month at Unknown time    Patient Stressors: Financial difficulties Health problems Loss of father and friend Substance abuse  Patient Strengths: Ability for insight Active sense of humor Average or  above average intelligence Communication skills General fund of knowledge Motivation for treatment/growth  Treatment Modalities: Medication Management, Group therapy, Case management,  1 to 1 session with clinician, Psychoeducation, Recreational therapy.   Physician Treatment Plan for Primary Diagnosis: Bipolar 1 disorder, depressed, severe (HCC)  Medication Management: Evaluate patient's response, side effects, and tolerance of medication regimen.  Therapeutic Interventions: 1 to 1 sessions, Unit Group sessions and Medication administration.  Evaluation of Outcomes: Progressing  Physician Treatment Plan for Secondary Diagnosis: Principal Problem:   Bipolar 1 disorder, depressed, severe (HCC) Active Problems:   Alcohol dependence (HCC)   Medication Management: Evaluate patient's response, side  effects, and tolerance of medication regimen.  Therapeutic Interventions: 1 to 1 sessions, Unit Group sessions and Medication administration.  Evaluation of Outcomes: Progressing  RN Treatment Plan for Primary Diagnosis: Bipolar 1 disorder, depressed, severe (HCC) Long Term Goal(s): Knowledge of disease and therapeutic regimen to maintain health will improve  Short Term Goals: Ability to remain free from injury will improve, Ability to verbalize frustration and anger appropriately will improve, Ability to demonstrate self-control, Ability to participate in decision making will improve, Ability to verbalize feelings will improve, Ability to disclose and discuss suicidal ideas, Ability to identify and develop effective coping behaviors will improve and Compliance with prescribed medications will improve  Medication Management: RN will administer medications as ordered by provider, will assess and evaluate patient's response and provide education to patient for prescribed medication. RN will report any adverse and/or side effects to prescribing provider.  Therapeutic Interventions: 1 on 1 counseling sessions, Psychoeducation, Medication administration, Evaluate responses to treatment, Monitor vital signs and CBGs as ordered, Perform/monitor CIWA, COWS, AIMS and Fall Risk screenings as ordered, Perform wound care treatments as ordered.  Evaluation of Outcomes: Progressing  LCSW Treatment Plan for Primary Diagnosis: Bipolar 1 disorder, depressed, severe (HCC) Long Term Goal(s): Safe transition to appropriate next level of care at discharge, Engage patient in therapeutic group addressing interpersonal concerns.  Short Term Goals: Engage patient in aftercare planning with referrals and resources, Increase social support, Increase ability to appropriately verbalize feelings, Increase emotional regulation, Facilitate acceptance of mental health diagnosis and concerns, Facilitate patient progression  through stages of change regarding substance use diagnoses and concerns, Identify triggers associated with mental health/substance abuse issues and Increase skills for wellness and recovery  Therapeutic Interventions: Assess for all discharge needs, 1 to 1 time with Social worker, Explore available resources and support systems, Assess for adequacy in community support network, Educate family and significant other(s) on suicide prevention, Complete Psychosocial Assessment, Interpersonal group therapy.  Evaluation of Outcomes: Progressing  Progress in Treatment: Attending groups: Yes Participating in groups: Yes Taking medication as prescribed: Yes. Toleration medication: Yes. Family/Significant other contact made: SPE completed with pt's girlfriend.  Patient understands diagnosis: Yes. Discussing patient identified problems/goals with staff: Yes. Medical problems stabilized or resolved: Yes. Denies suicidal/homicidal ideation: Yes. Issues/concerns per patient self-inventory: No. Other:   New problem(s) identified: None  New Short Term/Long Term Goal(s): medication stabilization, elimination of SI thoughts, development of comprehensive mental wellness plan.    Patient Goals: " I want to get balanced on meds"   Discharge Plan or Barriers: Pt has screening for possible admission at [facility] on Wed 03/01/18 at 8:00AM. Vesta Mixer for outpatient care. [facility] pamphlet and AA/NA information also provided for additional community support.    Reason for Continuation of Hospitalization: none  Estimated Length of Stay: Wed AM 03/01/18-to [facility]   Attendees: Patient: Steven Daniels  02/27/2018  9:51 AM  Physician: Dr. Jola Babinskilary MD; Dr. Altamese Carolinaainville MD 02/27/2018 9:51 AM  Nursing: Erskine SquibbJane RN; [facility] 02/27/2018 9:51 AM  RN Care Manager: Juliann ParesX 02/27/2018 9:51 AM  Social Worker: Trula SladeHeather Smart, LCSW 02/27/2018 9:51 AM  Recreational Therapist: Juliann ParesX  02/27/2018 9:51 AM  Other: Hillery Jacksanika Lewis NP; Feliz Beamravis Money  NP 02/27/2018 9:51 AM  Other: x 02/27/2018 9:51 AM  Othe :x  02/27/2018 9:51 AM    Scribe for Treatment Team: Ledell PeoplesHeather N Smart, LCSW 02/27/2018 9:51 AM

## 2018-02-27 NOTE — [facility] Group Notes (Signed)
LCSW Group Therapy Note   02/27/2018 1:15pm   Type of Therapy and Topic:  Group Therapy:  Overcoming Obstacles   Participation Level:  Active   Description of Group:    In this group patients will be encouraged to explore what they see as obstacles to their own wellness and recovery. They will be guided to discuss their thoughts, feelings, and behaviors related to these obstacles. The group will process together ways to cope with barriers, with attention given to specific choices patients can make. Each patient will be challenged to identify changes they are motivated to make in order to overcome their obstacles. This group will be process-oriented, with patients participating in exploration of their own experiences as well as giving and receiving support and challenge from other group members.   Therapeutic Goals: 1. Patient will identify personal and current obstacles as they relate to admission. 2. Patient will identify barriers that currently interfere with their wellness or overcoming obstacles.  3. Patient will identify feelings, thought process and behaviors related to these barriers. 4. Patient will identify two changes they are willing to make to overcome these obstacles:      Summary of Patient Progress   Steven Daniels was attentive and engaged during today's processing group. He shared that his biggest obstacle has been struggling as to whether he should [facility] or [facility]. He has chosen to stay at the hospital until his [facility] screening to avoid a potential relapse and continue getting treatment/learning coping skills in group. Steven Daniels continues to show progress in the group setting with improving insight.    Therapeutic Modalities:   Cognitive Behavioral Therapy Solution Focused Therapy Motivational Interviewing Relapse Prevention Therapy  Ledell PeoplesHeather N Smart, LCSW 02/27/2018 3:42 PM

## 2018-02-27 NOTE — Progress Notes (Signed)
Patient ID: Steven Daniels, male   DOB: 09/06/1961, 57 y.o.   MRN: [id number]  Pt currently presents with a flat affect and guarded, depressed behavior. Pt reports to writer that their goal is to "try to stay centered and focus on msyelf." Reports increased anxiety tonight as her spoke with his ex and found out she had restuned home from the hospital and sold some of his personal belongings. Pt states "I am trying to sleep it off right now." Limited interaction in the milieu tonight. Pt reports good sleep with current medication regimen.   Pt provided with medications per providers orders. Pt's labs and vitals were monitored throughout the night. Pt given a 1:1 about emotional and mental status. Pt supported and encouraged to express concerns and questions. Pt educated on medications and assertiveness techniques.   Pt's safety ensured with 15 minute and environmental checks. Pt currently denies SI/HI and A/V hallucinations. Pt verbally agrees to seek staff if SI/HI or A/VH occurs and to consult with staff before acting on any harmful thoughts. Will continue POC.

## 2018-02-28 MED ORDER — GABAPENTIN 300 MG PO CAPS: 300.0000 mg | ORAL_CAPSULE | Freq: Three times a day (TID) | ORAL | 0 refills | Status: DC

## 2018-02-28 MED ORDER — AMLODIPINE BESYLATE 10 MG PO TABS: 10.0000 mg | ORAL_TABLET | Freq: Every day | ORAL | 0 refills | Status: DC

## 2018-02-28 MED ORDER — QUETIAPINE FUMARATE 200 MG PO TABS: 200.0000 mg | ORAL_TABLET | Freq: Every day | ORAL | 0 refills | Status: DC

## 2018-02-28 MED ORDER — POTASSIUM CHLORIDE CRYS ER 20 MEQ PO TBCR: 20.0000 meq | EXTENDED_RELEASE_TABLET | Freq: Every day | ORAL | 0 refills | Status: DC

## 2018-02-28 MED ORDER — FLUOXETINE HCL 20 MG PO CAPS: 20.0000 mg | ORAL_CAPSULE | Freq: Every day | ORAL | 0 refills | Status: DC

## 2018-02-28 MED ORDER — HYDROXYZINE HCL 25 MG PO TABS: 25.0000 mg | ORAL_TABLET | Freq: Four times a day (QID) | ORAL | 0 refills | Status: DC | PRN

## 2018-02-28 MED ORDER — ACAMPROSATE CALCIUM 333 MG PO TBEC: 666.0000 mg | DELAYED_RELEASE_TABLET | Freq: Three times a day (TID) | ORAL | 0 refills | Status: DC

## 2018-02-28 NOTE — Progress Notes (Signed)
  [facility] Discharge Plan :  Will you be returning to the same living situation after discharge:  No. Pt plans to attend [facility] on Wed [facility]/01/18 for possible admission.  At discharge, do you have transportation home?: Yes,  friend--DISCHARGE SCHEDULED FOR  03/01/18 at 7:00am.  Do you have the ability to pay for your medications: Yes,  [place] Medicaid  Release of information consent forms completed and submitted to medical records by CSW.  Patient to Follow up at: Follow-up Information    [facility] Follow up on 03/01/2018.   Why:  Screening for possible admission on Wed, 03/01/18 at 8:00AM. Please bring: proof of [place]/photo ID and clothing. Thank you.  Contact information: [address] [place] [address] [phone]        [facility] Follow up.   Specialty:  Behavioral Health Why:  Walk in within 3 days of hospital/rehab discharge to be assessed for outpatient mental health services including Medication management and counseling. Walk in hours: Mon-Fri 8am-9am. Thank you.  Contact information: [address] [place] [address] [phone]           Next level of care provider has access to [facility] Link:no  Safety Planning and Suicide Prevention discussed: Yes,  SPE completed with pt's girlfriend. SPI pamphlet and Mobile Crisis information provided.  Have you used any form of tobacco in the last 30 days? (Cigarettes, Smokeless Tobacco, Cigars, and/or Pipes): Yes  Has patient been referred to the Quitline?: Patient refused referral  Patient has been referred for addiction treatment: Yes  Maeola SarahJolan E Jaysie Benthall, LCSWA 02/28/2018, 3:45 PM
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
## 2018-02-28 NOTE — [facility] Suicide Risk Assessment (Signed)
[facility] Discharge Suicide Risk Assessment   Principal Problem:  Alcohol Use Disorder  Discharge Diagnoses:  Patient Active Problem List   Diagnosis Date Noted  . Alcohol abuse with alcohol-induced mood disorder (HCC) [F10.14] 02/19/2018  . Bipolar 1 disorder, depressed, severe (HCC) [F31.4] 02/19/2018  . Chronic hepatitis C without hepatic coma (HCC) [B18.2] 12/29/2017  . Polysubstance abuse (HCC) [F19.10] 12/29/2017  . IVDU (intravenous drug user) [F19.90] 12/29/2017  . Chronic pain [G89.29] 12/29/2017  . Cigarette smoker [F17.210] 12/29/2017  . Hearing loss [H91.90] 12/29/2017  . H/O total hip arthroplasty, right [Z61.096] 12/29/2017  . Cocaine abuse (HCC) [F14.10] 12/09/2017  . Bipolar affective disorder, current episode mixed (HCC) [F31.60] 03/14/2016  . Alcohol dependence (HCC) [F10.20] 12/29/2015  . Severe recurrent major depression without psychotic features (HCC) [F33.2] 12/29/2015    Total Time spent with patient: 30 minutes  Musculoskeletal: Strength & Muscle Tone: within normal limits Gait & Station: normal Patient leans: N/A  Psychiatric Specialty Exam: ROS no headache, no chest pain, no shortness of breath, chronic R hip pain. History of hypoacusia.  Blood pressure 131/76, pulse 98, temperature 98.6 F (37 C), temperature source Oral, resp. rate 16, height 6\' 2"  (1.88 m), weight 97.5 kg (215 lb).Body mass index is 27.6 kg/m.  General Appearance: Well Groomed  Eye Contact::  Good  Speech:  Normal Rate409  Volume:  Normal  Mood:  improved " states my mood is pretty good today"  Affect:  Appropriate  Thought Process:  Linear and Descriptions of Associations: Intact  Orientation:  Other:  fully alert and attentive   Thought Content:  no hallucinations, no delusions, not internally preoccupied   Suicidal Thoughts:  No denies any suicidal ideations, no self injurious ideations, no homicidal or violent ideations  Homicidal Thoughts:  No  Memory:  recent and remote  grossly intact   Judgement:  Other:  improving   Insight:  improving   Psychomotor Activity:  Normal  Concentration:  Good  Recall:  Good  Fund of Knowledge:Good  Language: Good  Akathisia:  Negative  Handed:  Right  AIMS (if indicated):     Assets:  Communication Skills Desire for Improvement Resilience  Sleep:  Number of Hours: 6.25  Cognition: WNL  ADL's:  Intact   Mental Status Per Nursing Assessment::   On Admission:  Suicidal ideation indicated by patient  Demographic Factors:  57 year old male, separated, no children, on disability  Loss Factors: Disability, limited support network,   Historical Factors: History of alcohol use disorder , history of stimulant use disorder , history of depression, history of prior psychiatric admission  Risk Reduction Factors:   Positive coping skills or problem solving skills  Continued Clinical Symptoms:  Alert, attentive, well groomed, calm, mood improved, states he is feeling better, affect is appropriate, reactive, full in range, no thought disorder, no suicidal or self injurious ideations, no homicidal or violent ideations, no hallucinations, no delusions, not internally preoccupied, currently future oriented, focused on going to a rehab at discharge. Denies medication side effects. Currently calm, pleasant on approach.   Cognitive Features That Contribute To Risk:  No gross cognitive deficits noted upon discharge. Is alert , attentive, and oriented x 3    Suicide Risk:  Mild:  Suicidal ideation of limited frequency, intensity, duration, and specificity.  There are no identifiable plans, no associated intent, mild dysphoria and related symptoms, good self-control (both objective and subjective assessment), few other risk factors, and identifiable protective factors, including available and accessible social  support.  Follow-up Information    [facility], [facility] Follow up on 03/01/2018.   Why:  Screening for possible  admission on Wed, 03/01/18 at 8:00AM. Please bring: proof of [place] residency/photo ID and clothing. Thank you.  Contact information: [address] [place] [address] [phone]        [facility] Follow up.   Specialty:  Behavioral Health Why:  Walk in within 3 days of hospital/rehab discharge to be assessed for outpatient mental health services including Medication management and counseling. Walk in hours: Mon-Fri 8am-9am. Thank you.  Contact information: [address] [place] [address] [phone]           Plan Of Care/Follow-up recommendations:  Activity:  as tolerated  Diet:  regular Tests:  NA Other:  see below [facility] - leaves tomorrow in early AM. Reports he feels motivated to continue working on abstinence and sobriety via rehab.    Craige CottaFernando A Cobos, MD 02/28/2018, 3:33 PM

## 2018-02-28 NOTE — Progress Notes (Signed)
D:Pt's mood is labile and irritable at times. He reports that [facility] and talked about problems that he has had with his girlfriend.   A:Offered support, encouragement and 15 minute checks. R:Pt denies si and hi. Safety maintained on the unit.

## 2018-03-01 NOTE — Progress Notes (Signed)
Patient ID: Steven Daniels, male   DOB: 09/29/1961, 57 y.o.   MRN: [id number]  Pt discharged to lobby. Pt was stable and appreciative at that time. All papers and prescriptions were given and valuables returned. Verbal understanding expressed. Denies SI/HI and A/VH. Pt given opportunity to express concerns and ask questions. Pt voices appreciation.

## 2018-03-01 NOTE — Progress Notes (Signed)
Recreation Therapy Notes  Date: 4.3.19 Time: 9:30 a.m. Location: [address] Dayroom   Group Topic: Stress Management   Goal Area(s) Addresses:  Goal 1.1: To reduce stress  -Patient will report feeling a reduction in stress level  -Patient will identify the importance of stress management  -Patient will participate during stress management group treatment     Intervention: Stress Management   Activity: Meditation- Patients were in a peaceful environment with soft lighting enhancing patients mood. Patients listened to a body scan meditation to help decrease tension and stress levels    Education: Stress Management, Discharge Planning.    Education Outcome: Acknowledges edcuation/In group clarification offered/Needs additional education   Clinical Observations/Feedback:: Patient did not attend     [name], Recreation Therapy Intern   Steven Daniels 03/01/2018 8:13 AM 

## 2018-05-12 ENCOUNTER — Emergency Department (HOSPITAL_COMMUNITY)
Admission: EM | Admit: 2018-05-12 | Discharge: 2018-05-12 | Disposition: A | Discharge status: Home / Self Care | Payer: Medicaid Other | Attending: Emergency Medicine | Admitting: Emergency Medicine

## 2018-05-12 ENCOUNTER — Encounter (HOSPITAL_COMMUNITY): Payer: Self-pay

## 2018-05-12 ENCOUNTER — Other Ambulatory Visit: Payer: Self-pay

## 2018-05-12 DIAGNOSIS — Z79899 Other long term (current) drug therapy: Secondary | ICD-10-CM | POA: Insufficient documentation

## 2018-05-12 DIAGNOSIS — F17210 Nicotine dependence, cigarettes, uncomplicated: Secondary | ICD-10-CM | POA: Insufficient documentation

## 2018-05-12 DIAGNOSIS — I10 Essential (primary) hypertension: Secondary | ICD-10-CM | POA: Diagnosis present

## 2018-05-12 MED ORDER — AMLODIPINE BESYLATE 10 MG PO TABS: 10.0000 mg | ORAL_TABLET | Freq: Every day | ORAL | 0 refills | Status: DC

## 2018-05-12 MED ORDER — FLUOXETINE HCL 20 MG PO CAPS: 20.0000 mg | ORAL_CAPSULE | Freq: Every day | ORAL | 0 refills | Status: DC

## 2018-05-12 MED ORDER — QUETIAPINE FUMARATE 200 MG PO TABS: 200.0000 mg | ORAL_TABLET | Freq: Every day | ORAL | 0 refills | Status: DC

## 2018-05-12 MED ORDER — GABAPENTIN 300 MG PO CAPS: 300.0000 mg | ORAL_CAPSULE | Freq: Three times a day (TID) | ORAL | 0 refills | Status: DC

## 2018-05-12 NOTE — ED Provider Notes (Signed)
[facility]-EMERGENCY DEPT Provider Note   CSN: [id number] Arrival date & time: 05/12/18  1128     History   Chief Complaint Chief Complaint  Patient presents with  . Hypertension  . Medication Refill    HPI Steven Daniels is a 57 y.o. male.  The history is provided by the patient. No language interpreter was used.  Hypertension  This is a chronic problem. The problem occurs constantly. The problem has been gradually worsening. Pertinent negatives include no chest pain and no headaches. Nothing aggravates the symptoms. Nothing relieves the symptoms. He has tried nothing for the symptoms. The treatment provided no relief.  Medication Refill   Pt reports he is out of his medications.  Pt has been in inpatient rehab.  He is now outpatient.  Pt can not see his MD until next month.  Pt needs medication RX.  Past Medical History:  Diagnosis Date  . Alcohol abuse   . Bipolar affective disorder (HCC)   . Depression   . Hard of hearing   . Heart murmur    per pt 12/28/15  . Hypertension     Patient Active Problem List   Diagnosis Date Noted  . Alcohol abuse with alcohol-induced mood disorder (HCC) 02/19/2018  . Bipolar 1 disorder, depressed, severe (HCC) 02/19/2018  . Chronic hepatitis C without hepatic coma (HCC) 12/29/2017  . Polysubstance abuse (HCC) 12/29/2017  . IVDU (intravenous drug user) 12/29/2017  . Chronic pain 12/29/2017  . Cigarette smoker 12/29/2017  . Hearing loss 12/29/2017  . H/O total hip arthroplasty, right 12/29/2017  . Cocaine abuse (HCC) 12/09/2017  . Bipolar affective disorder, current episode mixed (HCC) 03/14/2016  . Alcohol dependence (HCC) 12/29/2015  . Severe recurrent major depression without psychotic features (HCC) 12/29/2015    Past Surgical History:  Procedure Laterality Date  . HIP FRACTURE SURGERY Right    x 3  . KNEE SURGERY Right         Home Medications    Prior to Admission medications   Medication Sig  Start Date End Date Taking? Authorizing Provider  acamprosate (CAMPRAL) 333 MG tablet Take 2 tablets (666 mg total) by mouth 3 (three) times daily with meals. For alcohol cravings 02/28/18   Money, Gerlene Burdock, FNP  amLODipine (NORVASC) 10 MG tablet Take 1 tablet (10 mg total) by mouth daily. For high blood pressure 05/12/18   Elson Areas, PA-C  FLUoxetine (PROZAC) 20 MG capsule Take 1 capsule (20 mg total) by mouth daily. For mood control 05/12/18   Elson Areas, PA-C  gabapentin (NEURONTIN) 300 MG capsule Take 1 capsule (300 mg total) by mouth 3 (three) times daily. 05/12/18   Elson Areas, PA-C  hydrOXYzine (ATARAX/VISTARIL) 25 MG tablet Take 1 tablet (25 mg total) by mouth every 6 (six) hours as needed for anxiety. 02/28/18   Money, Gerlene Burdock, FNP  potassium chloride SA (K-DUR,KLOR-CON) 20 MEQ tablet Take 1 tablet (20 mEq total) by mouth daily. 02/28/18   Money, Gerlene Burdock, FNP  QUEtiapine (SEROQUEL) 200 MG tablet Take 1 tablet (200 mg total) by mouth at bedtime. For mood control 05/12/18   Elson Areas, PA-C    Family History Family History  Problem Relation Age of Onset  . Other Mother        brain tumor behind left ear  . Alzheimer's disease Mother   . Skin cancer Father        melatomia  . Breast cancer Maternal Grandmother   .  Heart disease Paternal Uncle   . Heart disease Cousin        fathers side  . Colon cancer Neg Hx   . Esophageal cancer Neg Hx     Social History Social History   Tobacco Use  . Smoking status: Current Every Day Smoker    Packs/day: 1.00    Types: Cigarettes  . Smokeless tobacco: Never Used  Substance Use Topics  . Alcohol use: Yes    Frequency: Never    Comment: patient states he is in rehab  . Drug use: Not Currently    Types: Cocaine    Comment: crack, heroin -snort last use 12/27/2017     Allergies   Other; Apple; Cherry; Lisinopril; Penicillins; and Plum pulp   Review of Systems Review of Systems  Cardiovascular: Negative for chest  pain.  Neurological: Negative for headaches.  All other systems reviewed and are negative.    Physical Exam Updated Vital Signs BP (!) 158/94 (BP Location: Left Arm)   Pulse 79   Temp 98.1 F (36.7 C) (Oral)   Resp 20   Ht 6\' 2"  (1.88 m)   Wt 99.8 kg (220 lb)   SpO2 97%   BMI 28.25 kg/m   Physical Exam  Constitutional: He is oriented to person, place, and time. He appears well-developed and well-nourished.  HENT:  Head: Normocephalic.  Eyes: EOM are normal.  Neck: Normal range of motion.  Pulmonary/Chest: Effort normal.  Abdominal: He exhibits no distension.  Musculoskeletal: Normal range of motion.  Neurological: He is alert and oriented to person, place, and time.  Psychiatric: He has a normal mood and affect.  Nursing note and vitals reviewed.    ED Treatments / Results  Labs (all labs ordered are listed, but only abnormal results are displayed) Labs Reviewed - No data to display  EKG None  Radiology No results found.  Procedures Procedures (including critical care time)  Medications Ordered in ED Medications - No data to display   Initial Impression / Assessment and Plan / ED Course  I have reviewed the triage vital signs and the nursing notes.  Pertinent labs & imaging results that were available during my care of the patient were reviewed by me and considered in my medical decision making (see chart for details).     Pt will make appointment with his MD.  I will give him an rx for medications.   Final Clinical Impressions(s) / ED Diagnoses   Final diagnoses:  Hypertension, unspecified type    ED Discharge Orders        Ordered    amLODipine (NORVASC) 10 MG tablet  Daily     05/12/18 1330    FLUoxetine (PROZAC) 20 MG capsule  Daily     05/12/18 1330    gabapentin (NEURONTIN) 300 MG capsule  3 times daily     05/12/18 1330    QUEtiapine (SEROQUEL) 200 MG tablet  Daily at bedtime     05/12/18 1330    An After Visit Summary was printed  and given to the patient.    Elson AreasSofia, Insiya Oshea K, PA-C 05/12/18 1409    Cathren LaineSteinl, Kevin, MD 05/13/18 [phone]

## 2018-05-12 NOTE — ED Notes (Signed)
Pt is alert and oriented x 4 and is verbally responsive. Pt reports 6/10 HA and reports that he needs medication refill for HTN.

## 2018-05-12 NOTE — Discharge Instructions (Addendum)
Schedule to see your Physician for recheck,

## 2018-05-12 NOTE — ED Triage Notes (Signed)
Patient c/o headache and states he has been out of his BP meds and psych meds x 1 week. Patient requesting medication refill of Norvasc, prozac, seroquel and neurontin.

## 2018-05-12 NOTE — ED Notes (Signed)
Bed: WTR8 Expected date:  Expected time:  Means of arrival:  Comments: 

## 2018-06-07 ENCOUNTER — Encounter (HOSPITAL_COMMUNITY): Payer: Self-pay

## 2018-06-07 ENCOUNTER — Other Ambulatory Visit: Payer: Self-pay

## 2018-06-07 ENCOUNTER — Emergency Department (HOSPITAL_COMMUNITY)
Admission: EM | Admit: 2018-06-07 | Discharge: 2018-06-07 | Disposition: A | Discharge status: Home / Self Care | Payer: Medicaid Other | Attending: Emergency Medicine | Admitting: Emergency Medicine

## 2018-06-07 DIAGNOSIS — Z79899 Other long term (current) drug therapy: Secondary | ICD-10-CM | POA: Insufficient documentation

## 2018-06-07 DIAGNOSIS — F10929 Alcohol use, unspecified with intoxication, unspecified: Secondary | ICD-10-CM | POA: Diagnosis present

## 2018-06-07 DIAGNOSIS — F10230 Alcohol dependence with withdrawal, uncomplicated: Secondary | ICD-10-CM | POA: Diagnosis not present

## 2018-06-07 DIAGNOSIS — I10 Essential (primary) hypertension: Secondary | ICD-10-CM | POA: Diagnosis not present

## 2018-06-07 DIAGNOSIS — F17210 Nicotine dependence, cigarettes, uncomplicated: Secondary | ICD-10-CM | POA: Diagnosis not present

## 2018-06-07 DIAGNOSIS — F10930 Alcohol use, unspecified with withdrawal, uncomplicated: Secondary | ICD-10-CM

## 2018-06-07 DIAGNOSIS — F10920 Alcohol use, unspecified with intoxication, uncomplicated: Secondary | ICD-10-CM

## 2018-06-07 DIAGNOSIS — Z96641 Presence of right artificial hip joint: Secondary | ICD-10-CM | POA: Diagnosis not present

## 2018-06-07 HISTORY — DX: Unspecified viral hepatitis C without hepatic coma: B19.20

## 2018-06-07 HISTORY — DX: Unspecified convulsions: R56.9

## 2018-06-07 MED ORDER — CHLORDIAZEPOXIDE HCL 25 MG PO CAPS: ORAL_CAPSULE | ORAL | 0 refills | Status: DC

## 2018-06-07 NOTE — ED Notes (Signed)
Pt. Refuses to sign for, nor receive d/c paperwork.

## 2018-06-07 NOTE — ED Triage Notes (Addendum)
Patient is requesting detox from alcohol. Patient states that he has had seizures from withdrawal of alcohol. [facility]  Patient states he has had 2 40 ounce Edges today

## 2018-06-07 NOTE — ED Notes (Signed)
Several of us have just observed pt. To leave. He had been very loudly expressing dissatisfaction with his treatment, states "I need help and I can't believe they're sending me down the road with Librium!" He refuses to sign; after going to the b.r., he demands to be shown the way out, which we point out to him and he leaves without incident.

## 2018-06-07 NOTE — ED Provider Notes (Addendum)
[facility]-EMER[facility] DEPT Provider Note   CSN: [id number] Arrival date & time: 06/07/18  1014     History   Chief Complaint Chief Complaint  Patient presents with  . detox    alcohol    HPI Steven Daniels is a 57 y.o. male.  HPI  57 year old male comes in with chief complaint of wanting alcohol detox. Patient has history of alcohol abuse, hepatitis, depression.  [facility].  Patient's last drink was earlier today, and came to the ER to get inpatient detox until he is [facility].  Patient does not have any SI, HI.  Patient drinks 3 to 4 x 40 ounce beer every day.  Past Medical History:  Diagnosis Date  . Alcohol abuse   . Bipolar affective disorder (HCC)   . Depression   . Hard of hearing   . Heart murmur    per pt 12/28/15  . Hepatitis C   . Hypertension   . Seizures [facility](HCC)     Patient Active Problem List   Diagnosis Date Noted  . Alcohol abuse with alcohol-induced mood disorder (HCC) 02/19/2018  . Bipolar 1 disorder, depressed, severe (HCC) 02/19/2018  . Chronic hepatitis C without hepatic coma (HCC) 12/29/2017  . Polysubstance abuse (HCC) 12/29/2017  . IVDU (intravenous drug user) 12/29/2017  . Chronic pain 12/29/2017  . Cigarette smoker 12/29/2017  . Hearing loss 12/29/2017  . H/O total hip arthroplasty, right 12/29/2017  . Cocaine abuse (HCC) 12/09/2017  . Bipolar affective disorder, current episode mixed (HCC) 03/14/2016  . Alcohol dependence (HCC) 12/29/2015  . Severe recurrent major depression without psychotic features (HCC) 12/29/2015    Past Surgical History:  Procedure Laterality Date  . HIP FRACTURE SURGERY Right    x 3  . KNEE SURGERY Right         Home Medications    Prior to Admission medications   Medication Sig Start Date End Date Taking? Authorizing Provider  FLUoxetine (PROZAC) 20 MG capsule Take 1 capsule (20 mg total) by mouth daily. For  mood control 05/12/18  Yes Elson AreasSofia, Leslie K, PA-C  gabapentin (NEURONTIN) 300 MG capsule Take 1 capsule (300 mg total) by mouth 3 (three) times daily. 05/12/18  Yes Cheron SchaumannSofia, Leslie K, PA-C  propranolol (INDERAL) 80 MG tablet Take 80 mg by mouth daily.   Yes [provider]  QUEtiapine (SEROQUEL) 200 MG tablet Take 1 tablet (200 mg total) by mouth at bedtime. For mood control 05/12/18  Yes Elson AreasSofia, Leslie K, PA-C  acamprosate (CAMPRAL) 333 MG tablet Take 2 tablets (666 mg total) by mouth 3 (three) times daily with meals. For alcohol cravings Patient not taking: Reported on 06/07/2018 02/28/18   Money, Gerlene Burdockravis B, FNP  amLODipine (NORVASC) 10 MG tablet Take 1 tablet (10 mg total) by mouth daily. For high blood pressure Patient not taking: Reported on 06/07/2018 05/12/18   Elson AreasSofia, Leslie K, PA-C  chlordiazePOXIDE (LIBRIUM) 25 MG capsule 50mg  PO TID x 1D, then 25-50mg  PO BID X 1D, then 25-50mg  PO QD X 1D 06/07/18   Derwood KaplanNanavati, Walt Geathers, MD  hydrOXYzine (ATARAX/VISTARIL) 25 MG tablet Take 1 tablet (25 mg total) by mouth every 6 (six) hours as needed for anxiety. Patient not taking: Reported on 06/07/2018 02/28/18   Money, Gerlene Burdockravis B, FNP  potassium chloride SA (K-DUR,KLOR-CON) 20 MEQ tablet Take 1 tablet (20 mEq total) by mouth daily. Patient not taking: Reported on 06/07/2018  02/28/18   Money, Gerlene Burdock, FNP    Family History Family History  Problem Relation Age of Onset  . Other Mother        brain tumor behind left ear  . Alzheimer's disease Mother   . Skin cancer Father        melatomia  . Breast cancer Maternal Grandmother   . Heart disease Paternal Uncle   . Heart disease Cousin        fathers side  . Colon cancer Neg Hx   . Esophageal cancer Neg Hx     Social History Social History   Tobacco Use  . Smoking status: Current Every Day Smoker    Packs/day: 1.00    Types: Cigarettes  . Smokeless tobacco: Never Used  Substance Use Topics  . Alcohol use: Yes    Frequency: Never    Comment: patient  states he is in rehab  . Drug use: Not Currently    Types: Cocaine    Comment: crack, heroin -snort last use 12/27/2017     Allergies   Other; Apple; Cherry; Lisinopril; Penicillins; and Plum pulp   Review of Systems Review of Systems  Constitutional: Positive for activity change.  Gastrointestinal: Negative for nausea and vomiting.  Neurological: Negative for seizures.  Psychiatric/Behavioral: Negative for hallucinations and suicidal ideas.     Physical Exam Updated Vital Signs BP 127/80   Pulse 77   Temp 98.6 F (37 C) (Oral)   Resp 15   Ht 6\' 2"  (1.88 m)   Wt 99.8 kg (220 lb)   SpO2 96%   BMI 28.25 kg/m   Physical Exam  Constitutional: He is oriented to person, place, and time. He appears well-developed.  HENT:  Head: Atraumatic.  Neck: Neck supple.  Cardiovascular: Normal rate.  Pulmonary/Chest: Effort normal.  Neurological: He is alert and oriented to person, place, and time.  Mildly tremulous   Skin: Skin is warm.  Psychiatric:  Agitated  Nursing note and vitals reviewed.    ED Treatments / Results  Labs (all labs ordered are listed, but only abnormal results are displayed) Labs Reviewed - No data to display  EKG None  Radiology No results found.  Procedures Procedures (including critical care time)  Medications Ordered in ED Medications - No data to display   Initial Impression / Assessment and Plan / ED Course  I have reviewed the triage vital signs and the nursing notes.  Pertinent labs & imaging results that were available during my care of the patient were reviewed by me and considered in my medical decision making (see chart for details).     57 year old male with history of alcoholism comes in with chief complaint of inpatient detox.  [facility], he wants to get inpatient detox until he is ready to be sent [facility].  Patient has no SI, HI.  He had his last drink this morning and does not  demonstrate any symptoms of severe alcohol withdrawals at this time.  We will give patient Librium. Patient is not happy about this being sent home with Librium prescription, and according to the RN, patient had stormed out of the room prior to them getting to him with the discharge paperwork.  Final Clinical Impressions(s) / ED Diagnoses   Final diagnoses:  Alcoholic intoxication without complication (HCC)  Alcohol withdrawal syndrome without complication [facility])    ED Discharge Orders        Ordered  chlordiazePOXIDE (LIBRIUM) 25 MG capsule     06/07/18 1239       Derwood Kaplan, MD 06/07/18 1248    Derwood Kaplan, MD 06/07/18 1248

## 2018-06-07 NOTE — Discharge Instructions (Signed)
Please start taking Librium to help you cope with alcohol withdrawal symptoms. Do not mix Librium with alcohol, the combination can be deadly.  You have a bed available for rehab on Monday, please make sure you follow-up for that visit. Return to the ER if there is any seizures or confusion, hallucinations.

## 2018-06-07 NOTE — ED Notes (Addendum)
Pt states he has been through detox from alcohol several times. Pt states that his last drink was between 7-8am. Pt reports it was a "big one". Pt states " I just dont want to wait too long for ativan"

## 2018-06-11 ENCOUNTER — Ambulatory Visit (HOSPITAL_COMMUNITY)
Admission: EM | Admit: 2018-06-11 | Discharge: 2018-06-11 | Disposition: A | Discharge status: Home / Self Care | Payer: Medicaid Other | Attending: Internal Medicine | Admitting: Internal Medicine

## 2018-06-11 ENCOUNTER — Encounter (HOSPITAL_COMMUNITY): Payer: Self-pay | Admitting: Emergency Medicine

## 2018-06-11 ENCOUNTER — Other Ambulatory Visit: Payer: Self-pay

## 2018-06-11 DIAGNOSIS — F10129 Alcohol abuse with intoxication, unspecified: Secondary | ICD-10-CM | POA: Diagnosis not present

## 2018-06-11 DIAGNOSIS — Z760 Encounter for issue of repeat prescription: Secondary | ICD-10-CM | POA: Diagnosis not present

## 2018-06-11 DIAGNOSIS — I10 Essential (primary) hypertension: Secondary | ICD-10-CM

## 2018-06-11 DIAGNOSIS — F419 Anxiety disorder, unspecified: Secondary | ICD-10-CM

## 2018-06-11 MED ORDER — GABAPENTIN 300 MG PO CAPS: 300.0000 mg | ORAL_CAPSULE | Freq: Three times a day (TID) | ORAL | 0 refills | Status: DC

## 2018-06-11 MED ORDER — FLUOXETINE HCL 20 MG PO CAPS: 20.0000 mg | ORAL_CAPSULE | Freq: Every day | ORAL | 0 refills | Status: DC

## 2018-06-11 MED ORDER — PROPRANOLOL HCL 80 MG PO TABS: 80.0000 mg | ORAL_TABLET | Freq: Every day | ORAL | 0 refills | Status: DC

## 2018-06-11 MED ORDER — QUETIAPINE FUMARATE 200 MG PO TABS: 200.0000 mg | ORAL_TABLET | Freq: Every day | ORAL | 0 refills | Status: DC

## 2018-06-11 NOTE — Discharge Instructions (Signed)
[facility] planned. If do not go please establish with a primary care provider for further management of your medications and treatments.  If concerns about seizures or withdrawal, suicidal or homicidal ideation [facility].

## 2018-06-11 NOTE — ED Triage Notes (Signed)
Patient is requesting medication refills  [facility] and reports he needs 30 day scripts for medications.

## 2018-06-11 NOTE — ED Provider Notes (Signed)
[facility]    CSN: [id number] Arrival date & time: 06/11/18  1141     History   Chief Complaint Chief Complaint  Patient presents with  . Medication Refill    HPI Steven Daniels is a 57 y.o. male.   Steven Daniels presents with requests for refills of his medications. He endorses drinking a heavy amount of alcohol daily, will not state how much or what he drinks. Endorses he has drank alcohol this morning and is currently intoxicated. States he is going at [employer] tomorrow to [facility] rehab facility and needs a 30 day supply of his medications to be admitted there. Denies any drug use. Denies chest pain , palpitations. No suicidal or homicidal ideation. Has been at [facility] for 3 weeks in the past, it sounds like issues with his girl friend then led him to leave. Uses [facility]. Takes seroquel, gabapentin, and prozac as mood stabilizers, as well as propanolol. Unfortunately [facility] closed today so unable to confirm doses. Seroquel, gabapentin and prozac have [facility] past. Hx of alcohol abuse, bipolar, hep c, chronic pain, htn, seizures, hard of hearing.   ROS per HPI.      Past Medical History:  Diagnosis Date  . Alcohol abuse   . Bipolar affective disorder (HCC)   . Depression   . Hard of hearing   . Heart murmur    per pt 12/28/15  . Hepatitis C   . Hypertension   . Seizures [facility])     Patient Active Problem List   Diagnosis Date Noted  . Alcohol abuse with alcohol-induced mood disorder (HCC) 02/19/2018  . Bipolar 1 disorder, depressed, severe (HCC) 02/19/2018  . Chronic hepatitis C without hepatic coma (HCC) 12/29/2017  . Polysubstance abuse (HCC) 12/29/2017  . IVDU (intravenous drug user) 12/29/2017  . Chronic pain 12/29/2017  . Cigarette smoker 12/29/2017  . Hearing loss 12/29/2017  . H/O total hip arthroplasty, right 12/29/2017  . Cocaine abuse (HCC) 12/09/2017  . Bipolar affective disorder, current episode mixed  (HCC) 03/14/2016  . Alcohol dependence (HCC) 12/29/2015  . Severe recurrent major depression without psychotic features (HCC) 12/29/2015    Past Surgical History:  Procedure Laterality Date  . HIP FRACTURE SURGERY Right    x 3  . KNEE SURGERY Right        Home Medications    Prior to Admission medications   Medication Sig Start Date End Date Taking? Authorizing Provider  acamprosate (CAMPRAL) 333 MG tablet Take 2 tablets (666 mg total) by mouth 3 (three) times daily with meals. For alcohol cravings Patient not taking: Reported on 06/07/2018 02/28/18   Money, Gerlene Burdock, FNP  amLODipine (NORVASC) 10 MG tablet Take 1 tablet (10 mg total) by mouth daily. For high blood pressure Patient not taking: Reported on 06/07/2018 05/12/18   Elson Areas, PA-C  chlordiazePOXIDE (LIBRIUM) 25 MG capsule 50mg  PO TID x 1D, then 25-50mg  PO BID X 1D, then 25-50mg  PO QD X 1D 06/07/18   Derwood Kaplan, MD  FLUoxetine (PROZAC) 20 MG capsule Take 1 capsule (20 mg total) by mouth daily. For mood control 06/11/18   Linus Mako B, NP  gabapentin (NEURONTIN) 300 MG capsule Take 1 capsule (300 mg total) by mouth 3 (three) times daily. 06/11/18   Georgetta Haber, NP  hydrOXYzine (ATARAX/VISTARIL) 25 MG tablet Take 1 tablet (25 mg total) by mouth every 6 (six) hours as needed for anxiety. Patient not taking: Reported on 06/07/2018 02/28/18  Money, Gerlene Burdockravis B, FNP  potassium chloride SA (K-DUR,KLOR-CON) 20 MEQ tablet Take 1 tablet (20 mEq total) by mouth daily. 02/28/18   Money, Gerlene Burdockravis B, FNP  propranolol (INDERAL) 80 MG tablet Take 1 tablet (80 mg total) by mouth daily. 06/11/18   Georgetta HaberBurky, Joffrey Kerce B, NP  QUEtiapine (SEROQUEL) 200 MG tablet Take 1 tablet (200 mg total) by mouth at bedtime. For mood control 06/11/18   Georgetta HaberBurky, Jillianna Stanek B, NP    Family History Family History  Problem Relation Age of Onset  . Other Mother        brain tumor behind left ear  . Alzheimer's disease Mother   . Skin cancer Father        melatomia    . Breast cancer Maternal Grandmother   . Heart disease Paternal Uncle   . Heart disease Cousin        fathers side  . Colon cancer Neg Hx   . Esophageal cancer Neg Hx     Social History Social History   Tobacco Use  . Smoking status: Current Every Day Smoker    Packs/day: 1.00    Types: Cigarettes  . Smokeless tobacco: Never Used  Substance Use Topics  . Alcohol use: Yes    Frequency: Never    Comment: patient states he is in rehab  . Drug use: Not Currently    Types: Cocaine    Comment: crack, heroin -snort last use 12/27/2017     Allergies   Other; Apple; Cherry; Lisinopril; Penicillins; and Plum pulp   Review of Systems Review of Systems   Physical Exam Triage Vital Signs ED Triage Vitals  Enc Vitals Group     BP 06/11/18 1218 99/62     Pulse Rate 06/11/18 1218 79     Resp 06/11/18 1218 (!) 22     Temp 06/11/18 1218 97.7 F (36.5 C)     Temp Source 06/11/18 1218 Oral     SpO2 06/11/18 1218 97 %     Weight --      Height --      Head Circumference --      Peak Flow --      Pain Score 06/11/18 1214 0     Pain Loc --      Pain Edu? --      Excl. in GC? --    No data found.  Updated Vital Signs BP 99/62 (BP Location: Left Arm)   Pulse 79   Temp 97.7 F (36.5 C) (Oral)   Resp (!) 22   SpO2 97%    Physical Exam  Constitutional: He is oriented to person, place, and time. He appears well-developed and well-nourished.  Patient on exam table snoring asleep on initial entry to room  HENT:  Head: Normocephalic and atraumatic.  Cardiovascular: Normal rate and regular rhythm.  Pulmonary/Chest: Effort normal and breath sounds normal.  Neurological: He is alert and oriented to person, place, and time.  Skin: Skin is warm and dry.  Psychiatric: His speech is slurred.  Once awake patient interactive and pleasant, slurred speech noted however and mixing up of words occasionally; endorses drinking alcohol today PTA     UC Treatments / Results   Labs (all labs ordered are listed, but only abnormal results are displayed) Labs Reviewed - No data to display  EKG None  Radiology No results found.  Procedures Procedures (including critical care time)  Medications Ordered in UC Medications - No data to display  Initial Impression /  Assessment and Plan / UC Course  I have reviewed the triage vital signs and the nursing notes.  Pertinent labs & imaging results that were available during my care of the patient were reviewed by me and considered in my medical decision making (see chart for details).     Intoxicated currently. States a friend drove him here. Medications refilled in order for patient to enter into rehab facility. Unable to confirm inderal dose as pharmacy was closed, but vitals stable here today and this is a lower dose. No htn or indication of seizure activity at this time. Encouraged to go to Er if need be. Patient verbalized understanding and agreeable to plan.  Ambulatory out of clinic without difficulty.    Final Clinical Impressions(s) / UC Diagnoses   Final diagnoses:  Encounter for medication refill  Alcohol abuse with intoxication [facility])     Discharge Instructions     Please go to [facility] tomorrow at 8 a as planned. If do not go please establish with a primary care provider for further management of your medications and treatments.  If concerns about seizures or withdrawal, suicidal or homicidal ideation please go to [employer].    ED Prescriptions    Medication Sig Dispense Auth. Provider   FLUoxetine (PROZAC) 20 MG capsule Take 1 capsule (20 mg total) by mouth daily. For mood control 30 capsule Kellon Chalk B, NP   gabapentin (NEURONTIN) 300 MG capsule Take 1 capsule (300 mg total) by mouth 3 (three) times daily. 90 capsule Linus Mako B, NP   propranolol (INDERAL) 80 MG tablet Take 1 tablet (80 mg total) by mouth daily. 30 tablet Linus Mako B, NP   QUEtiapine (SEROQUEL) 200 MG  tablet Take 1 tablet (200 mg total) by mouth at bedtime. For mood control 30 tablet Georgetta Haber, NP     Controlled Substance Prescriptions [place] Controlled Substance Registry consulted? Not Applicable   Georgetta Haber, NP 06/11/18 1328

## 2018-06-24 ENCOUNTER — Other Ambulatory Visit: Payer: Self-pay

## 2018-06-24 ENCOUNTER — Encounter (HOSPITAL_BASED_OUTPATIENT_CLINIC_OR_DEPARTMENT_OTHER): Payer: Self-pay | Admitting: Emergency Medicine

## 2018-06-24 ENCOUNTER — Emergency Department (HOSPITAL_BASED_OUTPATIENT_CLINIC_OR_DEPARTMENT_OTHER)
Admission: EM | Admit: 2018-06-24 | Discharge: 2018-06-24 | Disposition: A | Discharge status: Home / Self Care | Payer: Medicaid Other | Attending: Emergency Medicine | Admitting: Emergency Medicine

## 2018-06-24 DIAGNOSIS — Y9301 Activity, walking, marching and hiking: Secondary | ICD-10-CM | POA: Diagnosis not present

## 2018-06-24 DIAGNOSIS — I10 Essential (primary) hypertension: Secondary | ICD-10-CM | POA: Insufficient documentation

## 2018-06-24 DIAGNOSIS — F17210 Nicotine dependence, cigarettes, uncomplicated: Secondary | ICD-10-CM | POA: Diagnosis not present

## 2018-06-24 DIAGNOSIS — Y929 Unspecified place or not applicable: Secondary | ICD-10-CM | POA: Diagnosis not present

## 2018-06-24 DIAGNOSIS — S80211A Abrasion, right knee, initial encounter: Secondary | ICD-10-CM | POA: Diagnosis not present

## 2018-06-24 DIAGNOSIS — Z79899 Other long term (current) drug therapy: Secondary | ICD-10-CM | POA: Insufficient documentation

## 2018-06-24 DIAGNOSIS — Y999 Unspecified external cause status: Secondary | ICD-10-CM | POA: Insufficient documentation

## 2018-06-24 DIAGNOSIS — W010XXA Fall on same level from slipping, tripping and stumbling without subsequent striking against object, initial encounter: Secondary | ICD-10-CM | POA: Diagnosis not present

## 2018-06-24 DIAGNOSIS — S8991XA Unspecified injury of right lower leg, initial encounter: Secondary | ICD-10-CM | POA: Diagnosis present

## 2018-06-24 NOTE — ED Notes (Signed)
ED Provider at bedside. 

## 2018-06-24 NOTE — Discharge Instructions (Signed)
Please continue to apply antibiotic ointment daily as need.If your symptoms worsen or you experience any of the following symptoms:  You have a fever. You have fluid, blood, or pus coming from your wound. You notice a bad smell coming from your wound or your dressing. You have a red streak going away from your wound

## 2018-06-24 NOTE — ED Notes (Signed)
PT IS HARD OF HEARING 

## 2018-06-24 NOTE — ED Triage Notes (Signed)
Fell x 1 week ago and was told to come to ED for possible antibiotics due to scab to right knee, [facility]

## 2018-06-24 NOTE — ED Notes (Signed)
Pt verbalized understanding of dc instructions.

## 2018-06-25 NOTE — ED Provider Notes (Signed)
[facility] EMERGENCY DEPARTMENT Provider Note   CSN: [id number] Arrival date & time: 06/24/18  [id number]     History   Chief Complaint Chief Complaint  Patient presents with  . Knee Injury    HPI Steven Daniels is a 57 y.o. male.  57 y/o male presents to the ED s/p fall one week ago. Patient states he was walking when he fell and scrapped both of his knees. He has been applying triple antibiotic on his abrasions but states [facility] wanted to make sure he did not have MRSA. Patient states there is no pain to his abrasions and that there has been no drainage from them. He has not taking anything for pain as [facility]. He denies any fever, abdominal pain, chest pain or shortness of breath.      Past Medical History:  Diagnosis Date  . Alcohol abuse   . Bipolar affective disorder (HCC)   . Depression   . Hard of hearing   . Heart murmur    per pt 12/28/15  . Hepatitis C   . Hypertension   . Seizures [facility](HCC)     Patient Active Problem List   Diagnosis Date Noted  . Alcohol abuse with alcohol-induced mood disorder (HCC) 02/19/2018  . Bipolar 1 disorder, depressed, severe (HCC) 02/19/2018  . Chronic hepatitis C without hepatic coma (HCC) 12/29/2017  . Polysubstance abuse (HCC) 12/29/2017  . IVDU (intravenous drug user) 12/29/2017  . Chronic pain 12/29/2017  . Cigarette smoker 12/29/2017  . Hearing loss 12/29/2017  . H/O total hip arthroplasty, right 12/29/2017  . Cocaine abuse (HCC) 12/09/2017  . Bipolar affective disorder, current episode mixed (HCC) 03/14/2016  . Alcohol dependence (HCC) 12/29/2015  . Severe recurrent major depression without psychotic features (HCC) 12/29/2015    Past Surgical History:  Procedure Laterality Date  . HIP FRACTURE SURGERY Right    x 3  . KNEE SURGERY Right         Home Medications    Prior to Admission medications   Medication Sig Start Date End Date Taking? Authorizing Provider  acamprosate (CAMPRAL) 333 MG  tablet Take 2 tablets (666 mg total) by mouth 3 (three) times daily with meals. For alcohol cravings Patient not taking: Reported on 06/07/2018 02/28/18   Money, Gerlene Burdockravis B, FNP  amLODipine (NORVASC) 10 MG tablet Take 1 tablet (10 mg total) by mouth daily. For high blood pressure Patient not taking: Reported on 06/07/2018 05/12/18   Elson AreasSofia, Leslie K, PA-C  chlordiazePOXIDE (LIBRIUM) 25 MG capsule 50mg  PO TID x 1D, then 25-50mg  PO BID X 1D, then 25-50mg  PO QD X 1D 06/07/18   Derwood KaplanNanavati, Ankit, MD  FLUoxetine (PROZAC) 20 MG capsule Take 1 capsule (20 mg total) by mouth daily. For mood control 06/11/18   Linus MakoBurky, Natalie B, NP  gabapentin (NEURONTIN) 300 MG capsule Take 1 capsule (300 mg total) by mouth 3 (three) times daily. 06/11/18   Georgetta HaberBurky, Natalie B, NP  hydrOXYzine (ATARAX/VISTARIL) 25 MG tablet Take 1 tablet (25 mg total) by mouth every 6 (six) hours as needed for anxiety. Patient not taking: Reported on 06/07/2018 02/28/18   Money, Gerlene Burdockravis B, FNP  potassium chloride SA (K-DUR,KLOR-CON) 20 MEQ tablet Take 1 tablet (20 mEq total) by mouth daily. 02/28/18   Money, Gerlene Burdockravis B, FNP  propranolol (INDERAL) 80 MG tablet Take 1 tablet (80 mg total) by mouth daily. 06/11/18   Georgetta HaberBurky, Natalie B, NP  QUEtiapine (SEROQUEL) 200 MG tablet Take 1 tablet (200 mg total)  by mouth at bedtime. For mood control 06/11/18   Georgetta Haber, NP    Family History Family History  Problem Relation Age of Onset  . Other Mother        brain tumor behind left ear  . Alzheimer's disease Mother   . Skin cancer Father        melatomia  . Breast cancer Maternal Grandmother   . Heart disease Paternal Uncle   . Heart disease Cousin        fathers side  . Colon cancer Neg Hx   . Esophageal cancer Neg Hx     Social History Social History   Tobacco Use  . Smoking status: Current Every Day Smoker    Packs/day: 1.00    Types: Cigarettes  . Smokeless tobacco: Never Used  Substance Use Topics  . Alcohol use: Yes    Frequency: Never     Comment: patient states he is in rehab  . Drug use: Not Currently    Types: Cocaine    Comment: crack, heroin -snort last use 12/27/2017     Allergies   Other; Apple; Cherry; Lisinopril; Penicillins; and Plum pulp   Review of Systems Review of Systems  Constitutional: Negative for chills and fever.  HENT: Negative for ear pain and sore throat.   Eyes: Negative for pain and visual disturbance.  Respiratory: Negative for cough and shortness of breath.   Cardiovascular: Negative for chest pain and palpitations.  Gastrointestinal: Negative for abdominal pain and vomiting.  Genitourinary: Negative for dysuria and hematuria.  Musculoskeletal: Negative for arthralgias and back pain.  Skin: Positive for wound. Negative for color change, pallor and rash.  Neurological: Negative for seizures and syncope.  All other systems reviewed and are negative.    Physical Exam Updated Vital Signs BP 107/62 (BP Location: Left Arm)   Pulse 62   Temp 98.5 F (36.9 C) (Oral)   Resp 18   Ht 6\' 2"  (1.88 m)   Wt 99.8 kg (220 lb)   SpO2 98%   BMI 28.25 kg/m   Physical Exam  Constitutional: He is oriented to person, place, and time. He appears well-developed and well-nourished.  HENT:  Head: Normocephalic and atraumatic.  Mouth/Throat: Oropharynx is clear and moist.  Eyes: Pupils are equal, round, and reactive to light. No scleral icterus.  Neck: Normal range of motion. Neck supple.  Cardiovascular: Normal heart sounds.  Pulmonary/Chest: Effort normal and breath sounds normal. He has no wheezes. He exhibits no tenderness.  Abdominal: Soft. Bowel sounds are normal. He exhibits no distension. There is no tenderness. There is no guarding.  Musculoskeletal: He exhibits no tenderness or deformity.  Neurological: He is alert and oriented to person, place, and time.  Skin: Skin is warm and dry. Capillary refill takes less than 2 seconds. Abrasion and bruising noted. No rash noted. There is erythema.      Small circular wound on right kneecap.  There is crusting and scabbing on the wound, some erythema is present outer border of the wound.  There is no actively drainage of pus, warmth, streaking.  Nursing note and vitals reviewed.    ED Treatments / Results  Labs (all labs ordered are listed, but only abnormal results are displayed) Labs Reviewed - No data to display  EKG None  Radiology No results found.  Procedures Procedures (including critical care time)  Medications Ordered in ED Medications - No data to display   Initial Impression / Assessment and Plan / ED  Course  I have reviewed the triage vital signs and the nursing notes.  Pertinent labs & imaging results that were available during my care of the patient were reviewed by me and considered in my medical decision making (see chart for details).     Right knee shows 1 circular scab around the kneecap region, there is redness around the wound but no signs of infection, drainage, streaking.  His vitals are stable.  He has been applying triple antibiotic ointment and this has seemed to help with healing process. [facility] wanted to make sure patient was not infected with MRSA.  Patient states his wounds are healing nicely and he would like to return to the rehabilitation center.  Patient states he is having very lucid dreams him a thinks is the medication he was placed on at [facility].  At this time I am not sure which medication he states that is causing this dreams, I have advised patient that I cannot adjust his SSRIs at this time as I am not the provider who prescribed this medication, nor the provider who follows him long-term.  Patient seems reasonable and understands that he will try to contact PCP.  He is medically stable for discharge.  Final Clinical Impressions(s) / ED Diagnoses   Final diagnoses:  Abrasion of right knee, initial encounter    ED Discharge Orders    None        Claude Manges, PA-C 06/25/18 1125    Pricilla Loveless, MD 06/28/18 [phone]

## 2018-06-28 ENCOUNTER — Ambulatory Visit: Payer: Self-pay

## 2018-07-21 ENCOUNTER — Encounter (HOSPITAL_BASED_OUTPATIENT_CLINIC_OR_DEPARTMENT_OTHER): Payer: Self-pay | Admitting: *Deleted

## 2018-07-21 ENCOUNTER — Other Ambulatory Visit: Payer: Self-pay

## 2018-07-21 ENCOUNTER — Emergency Department (HOSPITAL_BASED_OUTPATIENT_CLINIC_OR_DEPARTMENT_OTHER)
Admission: EM | Admit: 2018-07-21 | Discharge: 2018-07-21 | Disposition: A | Discharge status: Home / Self Care | Payer: Self-pay | Attending: Emergency Medicine | Admitting: Emergency Medicine

## 2018-07-21 DIAGNOSIS — F17210 Nicotine dependence, cigarettes, uncomplicated: Secondary | ICD-10-CM | POA: Insufficient documentation

## 2018-07-21 DIAGNOSIS — I10 Essential (primary) hypertension: Secondary | ICD-10-CM | POA: Insufficient documentation

## 2018-07-21 DIAGNOSIS — Z79899 Other long term (current) drug therapy: Secondary | ICD-10-CM | POA: Insufficient documentation

## 2018-07-21 DIAGNOSIS — L03213 Periorbital cellulitis: Secondary | ICD-10-CM | POA: Insufficient documentation

## 2018-07-21 MED ORDER — CLINDAMYCIN HCL 150 MG PO CAPS: 300.0000 mg | ORAL_CAPSULE | Freq: Three times a day (TID) | ORAL | 0 refills | Status: DC

## 2018-07-21 NOTE — ED Triage Notes (Signed)
He squeezed a pimple under his right eye last night. He woke with swelling under his eye.

## 2018-07-21 NOTE — ED Notes (Signed)
Pt in restroom. Need to collect stool smple

## 2018-07-21 NOTE — ED Notes (Signed)
Pt presents with swelling and redness under right eye. States that he was trying to squeeze a pimple on 8/22 pm and the redness and welling occurred immediately. Pt has hearing impairment in both ears - hears better in left ear. Does not have hearing aid devices at this time.

## 2018-07-21 NOTE — ED Provider Notes (Signed)
[facility] EMERGENCY DEPARTMENT Provider Note   CSN: [id number] Arrival date & time: 07/21/18  1400     History   Chief Complaint Chief Complaint  Patient presents with  . Eye Problem    HPI Simone CuriaClay Makin is a 57 y.o. male who presents the emergency department chief complaint of right eye swelling.  Patient states that he had a pimple read by his nose just below the eye yesterday.  He popped it and when he awoke this morning he had some erythema that has rapidly progressed.  He denies pain with eye movement, fevers or chills.  HPI  Past Medical History:  Diagnosis Date  . Alcohol abuse   . Bipolar affective disorder (HCC)   . Depression   . Hard of hearing   . Heart murmur    per pt 12/28/15  . Hepatitis C   . Hypertension   . Seizures [facility](HCC)     Patient Active Problem List   Diagnosis Date Noted  . Alcohol abuse with alcohol-induced mood disorder (HCC) 02/19/2018  . Bipolar 1 disorder, depressed, severe (HCC) 02/19/2018  . Chronic hepatitis C without hepatic coma (HCC) 12/29/2017  . Polysubstance abuse (HCC) 12/29/2017  . IVDU (intravenous drug user) 12/29/2017  . Chronic pain 12/29/2017  . Cigarette smoker 12/29/2017  . Hearing loss 12/29/2017  . H/O total hip arthroplasty, right 12/29/2017  . Cocaine abuse (HCC) 12/09/2017  . Bipolar affective disorder, current episode mixed (HCC) 03/14/2016  . Alcohol dependence (HCC) 12/29/2015  . Severe recurrent major depression without psychotic features (HCC) 12/29/2015    Past Surgical History:  Procedure Laterality Date  . HIP FRACTURE SURGERY Right    x 3  . KNEE SURGERY Right         Home Medications    Prior to Admission medications   Medication Sig Start Date End Date Taking? Authorizing Provider  acamprosate (CAMPRAL) 333 MG tablet Take 2 tablets (666 mg total) by mouth 3 (three) times daily with meals. For alcohol cravings Patient not taking: Reported on 06/07/2018 02/28/18   Money, Gerlene Burdockravis B,  FNP  amLODipine (NORVASC) 10 MG tablet Take 1 tablet (10 mg total) by mouth daily. For high blood pressure Patient not taking: Reported on 06/07/2018 05/12/18   Elson AreasSofia, Leslie K, PA-C  chlordiazePOXIDE (LIBRIUM) 25 MG capsule 50mg  PO TID x 1D, then 25-50mg  PO BID X 1D, then 25-50mg  PO QD X 1D 06/07/18   Derwood KaplanNanavati, Ankit, MD  FLUoxetine (PROZAC) 20 MG capsule Take 1 capsule (20 mg total) by mouth daily. For mood control 06/11/18   Linus MakoBurky, Natalie B, NP  gabapentin (NEURONTIN) 300 MG capsule Take 1 capsule (300 mg total) by mouth 3 (three) times daily. 06/11/18   Georgetta HaberBurky, Natalie B, NP  hydrOXYzine (ATARAX/VISTARIL) 25 MG tablet Take 1 tablet (25 mg total) by mouth every 6 (six) hours as needed for anxiety. Patient not taking: Reported on 06/07/2018 02/28/18   Money, Gerlene Burdockravis B, FNP  potassium chloride SA (K-DUR,KLOR-CON) 20 MEQ tablet Take 1 tablet (20 mEq total) by mouth daily. 02/28/18   Money, Gerlene Burdockravis B, FNP  propranolol (INDERAL) 80 MG tablet Take 1 tablet (80 mg total) by mouth daily. 06/11/18   Georgetta HaberBurky, Natalie B, NP  QUEtiapine (SEROQUEL) 200 MG tablet Take 1 tablet (200 mg total) by mouth at bedtime. For mood control 06/11/18   Georgetta HaberBurky, Natalie B, NP    Family History Family History  Problem Relation Age of Onset  . Other Mother  brain tumor behind left ear  . Alzheimer's disease Mother   . Skin cancer Father        melatomia  . Breast cancer Maternal Grandmother   . Heart disease Paternal Uncle   . Heart disease Cousin        fathers side  . Colon cancer Neg Hx   . Esophageal cancer Neg Hx     Social History Social History   Tobacco Use  . Smoking status: Current Every Day Smoker    Packs/day: 1.00    Types: Cigarettes  . Smokeless tobacco: Never Used  Substance Use Topics  . Alcohol use: Yes    Frequency: Never    Comment: patient states he is in rehab  . Drug use: Not Currently    Types: Cocaine    Comment: crack, heroin -snort last use 12/27/2017     Allergies   Other; Apple;  Cherry; Lisinopril; Penicillins; and Plum pulp   Review of Systems Review of Systems  Ten systems reviewed and are negative for acute change, except as noted in the HPI.   Physical Exam Updated Vital Signs BP (!) 152/82 (BP Location: Right Arm)   Pulse 62   Temp 98.8 F (37.1 C) (Oral)   Resp 18   Ht 6\' 2"  (1.88 m)   Wt 99.8 kg   SpO2 98%   BMI 28.25 kg/m   Physical Exam  Constitutional: He appears well-developed and well-nourished. No distress.  HENT:  Head: Normocephalic and atraumatic.  Eyes: Conjunctivae are normal. No scleral icterus.    Neck: Normal range of motion. Neck supple.  Cardiovascular: Normal rate, regular rhythm and normal heart sounds.  Pulmonary/Chest: Effort normal and breath sounds normal. No respiratory distress.  Abdominal: Soft. There is no tenderness.  Musculoskeletal: He exhibits no edema.  Neurological: He is alert.  Skin: Skin is warm and dry. He is not diaphoretic.  Psychiatric: His behavior is normal.  Nursing note and vitals reviewed.    ED Treatments / Results  Labs (all labs ordered are listed, but only abnormal results are displayed) Labs Reviewed  GASTROINTESTINAL PANEL BY PCR, STOOL (REPLACES STOOL CULTURE)  C DIFFICILE QUICK SCREEN W PCR REFLEX    EKG None  Radiology No results found.  Procedures Procedures (including critical care time)  Medications Ordered in ED Medications - No data to display   Initial Impression / Assessment and Plan / ED Course  I have reviewed the triage vital signs and the nursing notes.  Pertinent labs & imaging results that were available during my care of the patient were reviewed by me and considered in my medical decision making (see chart for details).     With preseptal cellulitis.  History of penicillin allergy.  Patient will be treated with clindamycin.  There is no concern for orbital cellulitis.  Patient's visual acuity is normal for him.  I discussed return precaution he is  to follow-up with ophthalmology.  Vision is afebrile and hemodynamically stable.  Final Clinical Impressions(s) / ED Diagnoses   Final diagnoses:  Preseptal cellulitis of right eye    ED Discharge Orders    None       Arthor Captain, PA-C 07/21/18 2009    Gwyneth Sprout, MD 07/21/18 2050

## 2018-07-21 NOTE — Discharge Instructions (Signed)
Get help right away if: You develop double vision, or your vision becomes blurred or worsens in any way. You have trouble moving your eyes. Your eye looks like it is sticking out or bulging out (proptosis). You develop a severe headache, severe neck pain, or neck stiffness. You develop repeated vomiting.

## 2019-01-10 ENCOUNTER — Emergency Department (HOSPITAL_COMMUNITY)
Admission: EM | Admit: 2019-01-10 | Discharge: 2019-01-10 | Disposition: A | Discharge status: Home / Self Care | Payer: Self-pay | Attending: Emergency Medicine | Admitting: Emergency Medicine

## 2019-01-10 ENCOUNTER — Other Ambulatory Visit: Payer: Self-pay

## 2019-01-10 ENCOUNTER — Emergency Department (HOSPITAL_COMMUNITY): Payer: Self-pay

## 2019-01-10 DIAGNOSIS — I10 Essential (primary) hypertension: Secondary | ICD-10-CM | POA: Insufficient documentation

## 2019-01-10 DIAGNOSIS — F17210 Nicotine dependence, cigarettes, uncomplicated: Secondary | ICD-10-CM | POA: Insufficient documentation

## 2019-01-10 DIAGNOSIS — Z79899 Other long term (current) drug therapy: Secondary | ICD-10-CM | POA: Insufficient documentation

## 2019-01-10 DIAGNOSIS — J209 Acute bronchitis, unspecified: Secondary | ICD-10-CM | POA: Insufficient documentation

## 2019-01-10 LAB — BASIC METABOLIC PANEL
Anion gap: 11 (ref 5–15)
BUN: 14 mg/dL (ref 6–20)
CALCIUM: 9.6 mg/dL (ref 8.9–10.3)
CO2: 29 mmol/L (ref 22–32)
CREATININE: 0.68 mg/dL (ref 0.61–1.24)
Chloride: 98 mmol/L (ref 98–111)
GFR calc Af Amer: >60 mL/min (ref >60)
Glucose, Bld: 122 mg/dL — ABNORMAL HIGH (ref 70–99)
POTASSIUM: 5 mmol/L (ref 3.5–5.1)
SODIUM: 138 mmol/L (ref 135–145)

## 2019-01-10 LAB — POCT I-STAT EG7
Acid-Base Excess: 5 mmol/L — ABNORMAL HIGH (ref 0.0–2.0)
Bicarbonate: 34.3 mmol/L — ABNORMAL HIGH (ref 20.0–28.0)
Calcium, Ion: 1.27 mmol/L (ref 1.15–1.40)
HCT: 52 % (ref 39.0–52.0)
Hemoglobin: 17.7 g/dL — ABNORMAL HIGH (ref 13.0–17.0)
O2 Saturation: 86 %
PH VEN: 7.321 (ref 7.250–7.430)
Potassium: 4.9 mmol/L (ref 3.5–5.1)
Sodium: 137 mmol/L (ref 135–145)
TCO2: 36 mmol/L — AB (ref 22–32)
pCO2, Ven: 66.4 mmHg — ABNORMAL HIGH (ref 44.0–60.0)
pO2, Ven: 59 mmHg — ABNORMAL HIGH (ref 32.0–45.0)

## 2019-01-10 LAB — CBC
HCT: 51.8 % (ref 39.0–52.0)
Hemoglobin: 16 g/dL (ref 13.0–17.0)
MCH: 29.6 pg (ref 26.0–34.0)
MCHC: 30.9 g/dL (ref 30.0–36.0)
MCV: 95.9 fL (ref 80.0–100.0)
PLATELETS: 218 10*3/uL (ref 150–400)
RBC: 5.4 MIL/uL (ref 4.22–5.81)
RDW: 13.5 % (ref 11.5–15.5)
WBC: 12.7 10*3/uL — AB (ref 4.0–10.5)
nRBC: 0 % (ref 0.0–0.2)

## 2019-01-10 LAB — I-STAT TROPONIN, ED
TROPONIN I, POC: 0 ng/mL (ref 0.00–0.08)
Troponin i, poc: 0 ng/mL (ref 0.00–0.08)

## 2019-01-10 MED ORDER — DOXYCYCLINE HYCLATE 100 MG PO TABS
100.0000 mg | ORAL_TABLET | Freq: Once | ORAL | Status: AC
  Administered 2019-01-10: 100 mg via ORAL
  Filled 2019-01-10: qty 1

## 2019-01-10 MED ORDER — METHYLPREDNISOLONE SODIUM SUCC 125 MG IJ SOLR
125.0000 mg | Freq: Once | INTRAMUSCULAR | Status: AC
  Administered 2019-01-10: 125 mg via INTRAVENOUS
  Filled 2019-01-10: qty 2

## 2019-01-10 MED ORDER — DOXYCYCLINE HYCLATE 100 MG PO CAPS: 100.0000 mg | ORAL_CAPSULE | Freq: Two times a day (BID) | ORAL | 0 refills | Status: DC

## 2019-01-10 MED ORDER — PREDNISONE 10 MG (21) PO TBPK: ORAL_TABLET | Freq: Every day | ORAL | 0 refills | Status: DC

## 2019-01-10 MED ORDER — SODIUM CHLORIDE 0.9% FLUSH
3.0000 mL | Freq: Once | INTRAVENOUS | Status: AC
  Administered 2019-01-10: 3 mL via INTRAVENOUS

## 2019-01-10 MED ORDER — IOPAMIDOL (ISOVUE-370) INJECTION 76%
INTRAVENOUS | Status: AC
  Administered 2019-01-10: 16:00:00
  Filled 2019-01-10: qty 100

## 2019-01-10 MED ORDER — IPRATROPIUM-ALBUTEROL 0.5-2.5 (3) MG/3ML IN SOLN
3.0000 mL | Freq: Once | RESPIRATORY_TRACT | Status: AC
  Administered 2019-01-10: 3 mL via RESPIRATORY_TRACT
  Filled 2019-01-10: qty 3

## 2019-01-10 NOTE — ED Notes (Signed)
Pt ambulates with minimal assistance. Pt feels SOB. O2 drops to 87% within 20 paces from room.

## 2019-01-10 NOTE — ED Provider Notes (Signed)
[facility] EMERGENCY DEPARTMENT Provider Note   CSN: [id number] Arrival date & time: 01/10/19  1208     History   Chief Complaint Chief Complaint  Patient presents with  . Chest Pain    HPI Steven Daniels is a 58 y.o. male with a past medical history of polysubstance abuse including cocaine, opiates, methamphetamines, benzodiazepines; hypertension, who presents to ED for multiple complaints.  He reports chest pain located in the center of his chest, cough productive with mucus, shortness of breath, wheezing and "feeling tired and sleepy all the time."  Symptoms have been going on for the past 3 weeks.  He was seen and evaluated by his PCP, diagnosed with bronchitis and given antibiotics.  He took the entire course of this antibiotics with improvement in his symptoms but symptoms recurred several days ago.  He is currently in rehab for his substance abuse and has completed his detox medications.  He denies any leg swelling, prior MI, DVT, PE, recent immobilization, sick contacts, fever, abdominal pain, vomiting.  HPI  Past Medical History:  Diagnosis Date  . Alcohol abuse   . Bipolar affective disorder (HCC)   . Depression   . Hard of hearing   . Heart murmur    per pt 12/28/15  . Hepatitis C   . Hypertension   . Seizures [facility])     Patient Active Problem List   Diagnosis Date Noted  . Alcohol abuse with alcohol-induced mood disorder (HCC) 02/19/2018  . Bipolar 1 disorder, depressed, severe (HCC) 02/19/2018  . Chronic hepatitis C without hepatic coma (HCC) 12/29/2017  . Polysubstance abuse (HCC) 12/29/2017  . IVDU (intravenous drug user) 12/29/2017  . Chronic pain 12/29/2017  . Cigarette smoker 12/29/2017  . Hearing loss 12/29/2017  . H/O total hip arthroplasty, right 12/29/2017  . Cocaine abuse (HCC) 12/09/2017  . Bipolar affective disorder, current episode mixed (HCC) 03/14/2016  . Alcohol dependence (HCC) 12/29/2015  . Severe recurrent major  depression without psychotic features (HCC) 12/29/2015    Past Surgical History:  Procedure Laterality Date  . HIP FRACTURE SURGERY Right    x 3  . KNEE SURGERY Right         Home Medications    Prior to Admission medications   Medication Sig Start Date End Date Taking? Authorizing Provider  albuterol (PROVENTIL HFA;VENTOLIN HFA) 108 (90 Base) MCG/ACT inhaler Inhale 2 puffs into the lungs 4 (four) times daily as needed. 12/20/18  Yes [provider]  diazepam (VALIUM) 5 MG/ML injection Inject 10 mg into the vein as needed (Seizures). Prn for seizures, may repeat in 30 seconds if symptoms presist   Yes [provider]  gabapentin (NEURONTIN) 300 MG capsule Take 1 capsule (300 mg total) by mouth 3 (three) times daily. 06/11/18  Yes Burky, Dorene Grebe B, NP  guaiFENesin (MUCINEX) 600 MG 12 hr tablet Take 600 mg by mouth 2 (two) times daily. 9-5 Congestion   Yes [provider]  ibuprofen (ADVIL,MOTRIN) 600 MG tablet Take 600 mg by mouth every 6 (six) hours as needed for fever or moderate pain.   Yes [provider]  ipratropium-albuterol (DUONEB) 0.5-2.5 (3) MG/3ML SOLN Take 3 mLs by nebulization 2 (two) times daily. For 3 days 01/10/19 01/13/19 Yes [provider]  Multiple Vitamin (MULTI VITAMIN) TABS Take 1 tablet by mouth daily at 12 noon. Folic acid   Yes [provider]  omega-3 acid ethyl esters (LOVAZA) 1 g capsule Take 3 g by mouth 2 (two)  times daily.   Yes [provider]  propranolol (INDERAL) 80 MG tablet Take 1 tablet (80 mg total) by mouth daily. Patient taking differently: Take 40 mg by mouth 2 (two) times daily.  06/11/18  Yes Linus MakoBurky, Natalie B, NP  thiamine (VITAMIN B-1) 100 MG tablet Take 100 mg by mouth daily.   Yes [provider]  traZODone (DESYREL) 50 MG tablet Take 50 mg by mouth at bedtime as needed (insomnia).   Yes [provider]  umeclidinium-vilanterol (ANORO ELLIPTA) 62.5-25 MCG/INH AEPB  Inhale 1 puff into the lungs daily.   Yes [provider]  chlordiazePOXIDE (LIBRIUM) 25 MG capsule 50mg  PO TID x 1D, then 25-50mg  PO BID X 1D, then 25-50mg  PO QD X 1D Patient not taking: Reported on 01/10/2019 06/07/18   Derwood KaplanNanavati, Ankit, MD  clindamycin (CLEOCIN) 150 MG capsule Take 2 capsules (300 mg total) by mouth 3 (three) times daily. Patient not taking: Reported on 01/10/2019 07/21/18   Arthor CaptainHarris, Abigail, PA-C  doxycycline (VIBRAMYCIN) 100 MG capsule Take 1 capsule (100 mg total) by mouth 2 (two) times daily for 7 days. 01/10/19 01/17/19  Tahlia Deamer, PA-C  FLUoxetine (PROZAC) 20 MG capsule Take 1 capsule (20 mg total) by mouth daily. For mood control Patient not taking: Reported on 01/10/2019 06/11/18   Linus MakoBurky, Natalie B, NP  potassium chloride SA (K-DUR,KLOR-CON) 20 MEQ tablet Take 1 tablet (20 mEq total) by mouth daily. Patient not taking: Reported on 01/10/2019 02/28/18   Money, Gerlene Burdockravis B, FNP  predniSONE (STERAPRED UNI-PAK 21 TAB) 10 MG (21) TBPK tablet Take by mouth daily. Take 6 tabs by mouth daily  for 2 days, then 5 tabs for 2 days, then 4 tabs for 2 days, then 3 tabs for 2 days, 2 tabs for 2 days, then 1 tab by mouth daily for 2 days 01/10/19   Idelle LeechKhatri, Laniya Friedl, PA-C  QUEtiapine (SEROQUEL) 200 MG tablet Take 1 tablet (200 mg total) by mouth at bedtime. For mood control Patient not taking: Reported on 01/10/2019 06/11/18   Georgetta HaberBurky, Natalie B, NP    Family History Family History  Problem Relation Age of Onset  . Other Mother        brain tumor behind left ear  . Alzheimer's disease Mother   . Skin cancer Father        melatomia  . Breast cancer Maternal Grandmother   . Heart disease Paternal Uncle   . Heart disease Cousin        fathers side  . Colon cancer Neg Hx   . Esophageal cancer Neg Hx     Social History Social History   Tobacco Use  . Smoking status: Current Every Day Smoker    Packs/day: 1.00    Types: Cigarettes  . Smokeless tobacco: Never Used  Substance Use Topics    . Alcohol use: Yes    Frequency: Never    Comment: patient states he is in rehab  . Drug use: Not Currently    Types: Cocaine    Comment: crack, heroin -snort last use 12/27/2017     Allergies   Other; Apple; Cherry; Lisinopril; Penicillins; and Plum pulp   Review of Systems Review of Systems  Constitutional: Positive for fatigue. Negative for appetite change, chills and fever.  HENT: Negative for ear pain, rhinorrhea, sneezing and sore throat.   Eyes: Negative for photophobia and visual disturbance.  Respiratory: Positive for cough, shortness of breath and wheezing. Negative for chest tightness.   Cardiovascular: Positive for chest  pain. Negative for palpitations.  Gastrointestinal: Negative for abdominal pain, blood in stool, constipation, diarrhea, nausea and vomiting.  Genitourinary: Negative for dysuria, hematuria and urgency.  Musculoskeletal: Negative for myalgias.  Skin: Negative for rash.  Neurological: Negative for dizziness, weakness and light-headedness.     Physical Exam Updated Vital Signs BP 130/84   Pulse 80   Temp 97.9 F (36.6 C) (Oral)   Resp 14   Ht 6\' 3"  (1.905 m)   Wt 127 kg   SpO2 95%   BMI 35.00 kg/m   Physical Exam Vitals signs and nursing note reviewed.  Constitutional:      General: He is not in acute distress.    Appearance: He is well-developed.  HENT:     Head: Normocephalic and atraumatic.     Nose: Nose normal.  Eyes:     General: No scleral icterus.       Left eye: No discharge.     Conjunctiva/sclera: Conjunctivae normal.  Neck:     Musculoskeletal: Normal range of motion and neck supple.  Cardiovascular:     Rate and Rhythm: Normal rate and regular rhythm.     Heart sounds: Normal heart sounds. No murmur. No friction rub. No gallop.   Pulmonary:     Effort: Pulmonary effort is normal. Tachypnea present. No respiratory distress.     Breath sounds: Examination of the right-middle field reveals wheezing. Examination of the  left-middle field reveals wheezing. Wheezing present.  Abdominal:     General: Bowel sounds are normal. There is no distension.     Palpations: Abdomen is soft.     Tenderness: There is no abdominal tenderness. There is no guarding.  Musculoskeletal: Normal range of motion.     Comments: No lower extremity edema, erythema or calf tenderness bilaterally.  Skin:    General: Skin is warm and dry.     Findings: No rash.  Neurological:     Mental Status: He is alert.     Motor: No abnormal muscle tone.     Coordination: Coordination normal.      ED Treatments / Results  Labs (all labs ordered are listed, but only abnormal results are displayed) Labs Reviewed  BASIC METABOLIC PANEL - Abnormal; Notable for the following components:      Result Value   Glucose, Bld 122 (*)    All other components within normal limits  CBC - Abnormal; Notable for the following components:   WBC 12.7 (*)    All other components within normal limits  POCT I-STAT EG7 - Abnormal; Notable for the following components:   pCO2, Ven 66.4 (*)    pO2, Ven 59.0 (*)    Bicarbonate 34.3 (*)    TCO2 36 (*)    Acid-Base Excess 5.0 (*)    Hemoglobin 17.7 (*)    All other components within normal limits  I-STAT TROPONIN, ED  I-STAT VENOUS BLOOD GAS, ED  I-STAT TROPONIN, ED    EKG EKG Interpretation  Date/Time:  Wednesday January 10 2019 12:18:23 EST Ventricular Rate:  73 PR Interval:    QRS Duration: 114 QT Interval:  394 QTC Calculation: 435 R Axis:   -15 Text Interpretation:  Sinus rhythm Incomplete right bundle branch block ST elevation suggests acute pericarditis similar to prior ecg 12/18 Confirmed by Meridee Score [phone]) on 01/10/2019 12:27:07 PM   Radiology Dg Chest 2 View  Result Date: 01/10/2019 CLINICAL DATA:  Worsening cough. EXAM: CHEST - 2 VIEW COMPARISON:  Chest x-ray 12/20/2017 and  CT chest, same date. FINDINGS: The heart is mildly enlarged but stable. Stable tortuosity of the  thoracic aorta. 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 Course interstitial markings and peribronchial thickening suggesting bronchitis. No definite infiltrates or effusions. IMPRESSION: 1. Stable cardiac enlargement. 2. Findings suggest bronchitis.  No infiltrates or effusions. Electronically Signed   By: Rudie Meyer M.D.   On: 01/10/2019 13:46   Ct Angio Chest Pe W/cm &/or Wo Cm  Result Date: 01/10/2019 CLINICAL DATA:  Chest pain, rule out PE EXAM: CT ANGIOGRAPHY CHEST WITH CONTRAST TECHNIQUE: Multidetector CT imaging of the chest was performed using the standard protocol during bolus administration of intravenous contrast. Multiplanar CT image reconstructions and MIPs were obtained to evaluate the vascular anatomy. CONTRAST:  <See Chart> ISOVUE-370 IOPAMIDOL (ISOVUE-370) INJECTION 76% COMPARISON:  12/20/2018 FINDINGS: Cardiovascular: Satisfactory opacification of the pulmonary arteries to the segmental level. No evidence of pulmonary embolism. Scattered coronary artery calcifications. Cardiomegaly. No pericardial effusion. Mediastinum/Nodes: No enlarged mediastinal, hilar, or axillary lymph nodes. Redemonstrated large exophytic nodule of the inferior pole of the right lobe of the thyroid measuring at least 6.3 cm extending into the superior mediastinum. Trachea and esophagus demonstrate no significant findings. Lungs/Pleura: Bibasilar scarring or atelectasis. Occasional tiny pulmonary nodules as seen on prior examination, for example a 2 mm nodule of the right upper lobe (series 7, image 29). No pleural effusion or pneumothorax. Upper Abdomen: No acute abnormality. Musculoskeletal: No chest wall abnormality. No acute or significant osseous findings. Review of the MIP images confirms the above findings. IMPRESSION: 1.  Negative examination for pulmonary embolism. 2.  Cardiomegaly. 3. Redemonstrated large exophytic nodule of the inferior pole of the right lobe of the thyroid measuring at least 6.3 cm  extending into the superior mediastinum. As on prior examination, recommend nonemergent ultrasound examination of the thyroid to further characterize. 4. Occasional tiny pulmonary nodules as seen on prior CT. As on prior, CT follow-up is optional for these nodules at 12 months if high risk factors for lung cancer are present. Electronically Signed   By: Lauralyn Primes M.D.   On: 01/10/2019 16:52    Procedures Procedures (including critical care time)  Medications Ordered in ED Medications  doxycycline (VIBRA-TABS) tablet 100 mg (has no administration in time range)  sodium chloride flush (NS) 0.9 % injection 3 mL (3 mLs Intravenous Given 01/10/19 1225)  ipratropium-albuterol (DUONEB) 0.5-2.5 (3) MG/3ML nebulizer solution 3 mL (3 mLs Nebulization Given 01/10/19 1254)  methylPREDNISolone sodium succinate (SOLU-MEDROL) 125 mg/2 mL injection 125 mg (125 mg Intravenous Given 01/10/19 1254)  iopamidol (ISOVUE-370) 76 % injection (  Contrast Given 01/10/19 1619)     Initial Impression / Assessment and Plan / ED Course  I have reviewed the triage vital signs and the nursing notes.  Pertinent labs & imaging results that were available during my care of the patient were reviewed by me and considered in my medical decision making (see chart for details).  Clinical Course as of Jan 11 1756  Wed Jan 10, 2019  4320 58 year old male history of polysubstance and had a recent bronchitis episode here with increased shortness of breath cough.  His work-up is been fairly unremarkable with a normal troponin and chest x-ray just showing some bronchitis findings   [MB]    Clinical Course User Index [MB] Terrilee Files, MD    58 year old male with past medical history of polysubstance abuse currently in rehab presents to ED for hest pain, shortness of breath, wheezing and cough for  the past 3 to 4 weeks.  He was treated for bronchitis with antibiotics due to similar symptoms several weeks ago.  He states that the  symptoms feel similar.  He completed his entire course of antibiotics previously.  Does not wear supplemental oxygen at home.  On my exam he has diffuse wheezing noted in bilateral lung fields.  Patient hypoxic to 80% on room air, complaining of being sleepy.  He EKG shows tracing similar to prior.  Initial troponin is negative.  Chest xray shows acute bronchitis with no infiltrate.  CTA of the chest is negative for PE.  BMP, unremarkable.  CBC shows leukocytosis of 12.7.  Patient hypoxic here although does appear sleepy. VBG unremarkable.  Patient oxygenation while ambulating dropped to 87%.  He is now on 2 L of oxygen.  I offered him admission for continued breathing treatments and abx for recurrence of his bronchitis.  He is requesting to go home.  He does have improved breath sounds with breathing treatments and steroids given here.  He states "all I need is some antibiotics like a shot in the butt and I'll be fine."  Will discharge home with remainder of steroid course, antibiotics and PCP follow-up.  Patient is hemodynamically stable, in NAD, and able to ambulate in the ED. Evaluation does not show pathology that would require ongoing emergent intervention or inpatient treatment. I explained the diagnosis to the patient. Pain has been managed and has no complaints prior to discharge. Patient is comfortable with above plan and is stable for discharge at this time. All questions were answered prior to disposition. Strict return precautions for returning to the ED were discussed. Encouraged follow up with PCP.    Portions of this note were generated with Scientist, clinical (histocompatibility and immunogenetics)Dragon dictation software. Dictation errors may occur despite best attempts at proofreading.   Final Clinical Impressions(s) / ED Diagnoses   Final diagnoses:  Acute bronchitis, unspecified organism    ED Discharge Orders         Ordered    doxycycline (VIBRAMYCIN) 100 MG capsule  2 times daily     01/10/19 1756    predniSONE (STERAPRED UNI-PAK  21 TAB) 10 MG (21) TBPK tablet  Daily     01/10/19 1756           Dietrich PatesKhatri, Rahi Chandonnet, PA-C 01/10/19 1757    Terrilee FilesButler, Michael C, MD 01/10/19 1801

## 2019-01-10 NOTE — ED Notes (Signed)
Pt placed on 2LPM via [place] for an O2 sat of 79% while drifting off to sleep

## 2019-01-10 NOTE — ED Triage Notes (Signed)
Pt currently in rehab for multi-substance abuse and was recently treated for bronchitis.  Pt reports CP 4/10 worse with coughing.  Pt reports feeling "really tired all the time, I can;t stay awake."  Pt states that he has completed his detox meds so he doesn't believe that to be an issue.  Pt states he is SOB with exertion and is audibly wheezing across the room.  EMS gave pt one albuterol nebulizer.

## 2019-01-10 NOTE — ED Notes (Signed)
ED Provider at bedside. 

## 2019-01-10 NOTE — Discharge Instructions (Addendum)
Please follow-up with your primary care provider.  Please complete the entire course of your antibiotics and steroids as this will prevent your infection from recurring or getting worse. Return to the ED for worsening symptoms, worsening shortness of breath, chest pain, vomiting or coughing up blood, leg swelling.

## 2019-01-10 NOTE — ED Notes (Signed)
Pt provided with urinal

## 2019-01-10 NOTE — ED Notes (Signed)
Patient transported to CT 

## 2019-01-11 ENCOUNTER — Encounter (HOSPITAL_COMMUNITY): Payer: Self-pay

## 2019-01-11 ENCOUNTER — Other Ambulatory Visit: Payer: Self-pay

## 2019-01-11 ENCOUNTER — Inpatient Hospital Stay (HOSPITAL_COMMUNITY)
Admission: EM | Admit: 2019-01-11 | Discharge: 2019-01-15 | DRG: 190 | Disposition: A | Discharge status: Home Health | Payer: Self-pay | Attending: Internal Medicine | Admitting: Internal Medicine

## 2019-01-11 ENCOUNTER — Emergency Department (HOSPITAL_COMMUNITY): Payer: Self-pay

## 2019-01-11 DIAGNOSIS — H9190 Unspecified hearing loss, unspecified ear: Secondary | ICD-10-CM | POA: Diagnosis present

## 2019-01-11 DIAGNOSIS — R0902 Hypoxemia: Secondary | ICD-10-CM

## 2019-01-11 DIAGNOSIS — Z79899 Other long term (current) drug therapy: Secondary | ICD-10-CM

## 2019-01-11 DIAGNOSIS — B349 Viral infection, unspecified: Secondary | ICD-10-CM

## 2019-01-11 DIAGNOSIS — E079 Disorder of thyroid, unspecified: Secondary | ICD-10-CM

## 2019-01-11 DIAGNOSIS — J9600 Acute respiratory failure, unspecified whether with hypoxia or hypercapnia: Secondary | ICD-10-CM | POA: Diagnosis present

## 2019-01-11 DIAGNOSIS — I119 Hypertensive heart disease without heart failure: Secondary | ICD-10-CM | POA: Diagnosis present

## 2019-01-11 DIAGNOSIS — F17210 Nicotine dependence, cigarettes, uncomplicated: Secondary | ICD-10-CM | POA: Diagnosis present

## 2019-01-11 DIAGNOSIS — Z8249 Family history of ischemic heart disease and other diseases of the circulatory system: Secondary | ICD-10-CM

## 2019-01-11 DIAGNOSIS — B1920 Unspecified viral hepatitis C without hepatic coma: Secondary | ICD-10-CM | POA: Diagnosis present

## 2019-01-11 DIAGNOSIS — F1910 Other psychoactive substance abuse, uncomplicated: Secondary | ICD-10-CM | POA: Diagnosis present

## 2019-01-11 DIAGNOSIS — F319 Bipolar disorder, unspecified: Secondary | ICD-10-CM | POA: Diagnosis present

## 2019-01-11 DIAGNOSIS — Z9119 Patient's noncompliance with other medical treatment and regimen: Secondary | ICD-10-CM

## 2019-01-11 DIAGNOSIS — Z9109 Other allergy status, other than to drugs and biological substances: Secondary | ICD-10-CM

## 2019-01-11 DIAGNOSIS — E042 Nontoxic multinodular goiter: Secondary | ICD-10-CM | POA: Diagnosis present

## 2019-01-11 DIAGNOSIS — Z716 Tobacco abuse counseling: Secondary | ICD-10-CM

## 2019-01-11 DIAGNOSIS — R059 Cough, unspecified: Secondary | ICD-10-CM

## 2019-01-11 DIAGNOSIS — F1010 Alcohol abuse, uncomplicated: Secondary | ICD-10-CM | POA: Diagnosis present

## 2019-01-11 DIAGNOSIS — J440 Chronic obstructive pulmonary disease with acute lower respiratory infection: Secondary | ICD-10-CM | POA: Diagnosis present

## 2019-01-11 DIAGNOSIS — Z880 Allergy status to penicillin: Secondary | ICD-10-CM

## 2019-01-11 DIAGNOSIS — Z888 Allergy status to other drugs, medicaments and biological substances status: Secondary | ICD-10-CM

## 2019-01-11 DIAGNOSIS — R011 Cardiac murmur, unspecified: Secondary | ICD-10-CM | POA: Diagnosis present

## 2019-01-11 DIAGNOSIS — J9601 Acute respiratory failure with hypoxia: Secondary | ICD-10-CM | POA: Diagnosis present

## 2019-01-11 DIAGNOSIS — R911 Solitary pulmonary nodule: Secondary | ICD-10-CM | POA: Diagnosis present

## 2019-01-11 DIAGNOSIS — Z79891 Long term (current) use of opiate analgesic: Secondary | ICD-10-CM

## 2019-01-11 DIAGNOSIS — J441 Chronic obstructive pulmonary disease with (acute) exacerbation: Principal | ICD-10-CM | POA: Diagnosis present

## 2019-01-11 DIAGNOSIS — Z91018 Allergy to other foods: Secondary | ICD-10-CM

## 2019-01-11 DIAGNOSIS — G4733 Obstructive sleep apnea (adult) (pediatric): Secondary | ICD-10-CM | POA: Diagnosis present

## 2019-01-11 DIAGNOSIS — R05 Cough: Secondary | ICD-10-CM

## 2019-01-11 DIAGNOSIS — J209 Acute bronchitis, unspecified: Secondary | ICD-10-CM | POA: Diagnosis present

## 2019-01-11 LAB — CBC
HCT: 50.7 % (ref 39.0–52.0)
Hemoglobin: 15.6 g/dL (ref 13.0–17.0)
MCH: 29.4 pg (ref 26.0–34.0)
MCHC: 30.8 g/dL (ref 30.0–36.0)
MCV: 95.7 fL (ref 80.0–100.0)
PLATELETS: 275 10*3/uL (ref 150–400)
RBC: 5.3 MIL/uL (ref 4.22–5.81)
RDW: 13.6 % (ref 11.5–15.5)
WBC: 21.6 10*3/uL — ABNORMAL HIGH (ref 4.0–10.5)
nRBC: 0 % (ref 0.0–0.2)

## 2019-01-11 LAB — INFLUENZA PANEL BY PCR (TYPE A & B)
Influenza A By PCR: NEGATIVE
Influenza B By PCR: NEGATIVE

## 2019-01-11 LAB — BASIC METABOLIC PANEL
Anion gap: 8 (ref 5–15)
BUN: 26 mg/dL — AB (ref 6–20)
CO2: 31 mmol/L (ref 22–32)
Calcium: 9.3 mg/dL (ref 8.9–10.3)
Chloride: 99 mmol/L (ref 98–111)
Creatinine, Ser: 0.8 mg/dL (ref 0.61–1.24)
GFR calc Af Amer: >60 mL/min (ref >60)
GFR calc non Af Amer: >60 mL/min (ref >60)
Glucose, Bld: 187 mg/dL — ABNORMAL HIGH (ref 70–99)
Potassium: 4.8 mmol/L (ref 3.5–5.1)
SODIUM: 138 mmol/L (ref 135–145)

## 2019-01-11 LAB — EXPECTORATED SPUTUM ASSESSMENT W REFEX TO RESP CULTURE

## 2019-01-11 LAB — RAPID URINE DRUG SCREEN, HOSP PERFORMED
Amphetamines: NOT DETECTED
Barbiturates: NOT DETECTED
Benzodiazepines: POSITIVE — AB
Cocaine: NOT DETECTED
OPIATES: NOT DETECTED
TETRAHYDROCANNABINOL: NOT DETECTED

## 2019-01-11 LAB — EXPECTORATED SPUTUM ASSESSMENT W GRAM STAIN, RFLX TO RESP C

## 2019-01-11 LAB — TSH: TSH: 0.121 u[IU]/mL — ABNORMAL LOW (ref 0.350–4.500)

## 2019-01-11 LAB — I-STAT TROPONIN, ED: TROPONIN I, POC: 0 ng/mL (ref 0.00–0.08)

## 2019-01-11 LAB — T4, FREE: Free T4: 0.82 ng/dL (ref 0.82–1.77)

## 2019-01-11 MED ORDER — PROPRANOLOL HCL 20 MG PO TABS
40.0000 mg | ORAL_TABLET | Freq: Two times a day (BID) | ORAL | Status: DC
  Administered 2019-01-11 – 2019-01-15 (×8): 40 mg via ORAL
  Filled 2019-01-11 (×8): qty 2

## 2019-01-11 MED ORDER — TRAZODONE HCL 50 MG PO TABS: 50.0000 mg | ORAL_TABLET | Freq: Every evening | ORAL | Status: DC | PRN

## 2019-01-11 MED ORDER — BUDESONIDE 0.5 MG/2ML IN SUSP
2.0000 mg | Freq: Four times a day (QID) | RESPIRATORY_TRACT | Status: DC
  Administered 2019-01-11 – 2019-01-13 (×5): 2 mg via RESPIRATORY_TRACT
  Filled 2019-01-11 (×6): qty 8

## 2019-01-11 MED ORDER — METHYLPREDNISOLONE SODIUM SUCC 125 MG IJ SOLR: 125.0000 mg | Freq: Once | INTRAMUSCULAR | Status: DC

## 2019-01-11 MED ORDER — SODIUM CHLORIDE 0.9 % IV SOLN
1.0000 g | INTRAVENOUS | Status: DC
  Administered 2019-01-11 – 2019-01-14 (×4): 1 g via INTRAVENOUS
  Filled 2019-01-11: qty 10
  Filled 2019-01-11 (×3): qty 1
  Filled 2019-01-11: qty 10

## 2019-01-11 MED ORDER — GUAIFENESIN ER 600 MG PO TB12
600.0000 mg | ORAL_TABLET | Freq: Two times a day (BID) | ORAL | Status: DC
  Administered 2019-01-11 – 2019-01-15 (×8): 600 mg via ORAL
  Filled 2019-01-11 (×8): qty 1

## 2019-01-11 MED ORDER — IPRATROPIUM-ALBUTEROL 0.5-2.5 (3) MG/3ML IN SOLN
3.0000 mL | RESPIRATORY_TRACT | Status: DC
  Administered 2019-01-11 – 2019-01-13 (×14): 3 mL via RESPIRATORY_TRACT
  Filled 2019-01-11 (×14): qty 3

## 2019-01-11 MED ORDER — GABAPENTIN 300 MG PO CAPS
300.0000 mg | ORAL_CAPSULE | Freq: Three times a day (TID) | ORAL | Status: DC
  Administered 2019-01-11 – 2019-01-15 (×12): 300 mg via ORAL
  Filled 2019-01-11 (×12): qty 1

## 2019-01-11 MED ORDER — NICOTINE 21 MG/24HR TD PT24
21.0000 mg | MEDICATED_PATCH | Freq: Every day | TRANSDERMAL | Status: DC
  Administered 2019-01-12 – 2019-01-15 (×4): 21 mg via TRANSDERMAL
  Filled 2019-01-11 (×4): qty 1

## 2019-01-11 MED ORDER — IPRATROPIUM-ALBUTEROL 0.5-2.5 (3) MG/3ML IN SOLN
3.0000 mL | Freq: Once | RESPIRATORY_TRACT | Status: AC
  Administered 2019-01-11: 3 mL via RESPIRATORY_TRACT
  Filled 2019-01-11: qty 3

## 2019-01-11 MED ORDER — VITAMIN B-1 100 MG PO TABS
100.0000 mg | ORAL_TABLET | Freq: Every day | ORAL | Status: DC
  Administered 2019-01-12 – 2019-01-15 (×4): 100 mg via ORAL
  Filled 2019-01-11 (×4): qty 1

## 2019-01-11 MED ORDER — METHYLPREDNISOLONE SODIUM SUCC 125 MG IJ SOLR
60.0000 mg | Freq: Four times a day (QID) | INTRAMUSCULAR | Status: DC
  Administered 2019-01-11 – 2019-01-15 (×14): 60 mg via INTRAVENOUS
  Filled 2019-01-11 (×14): qty 2

## 2019-01-11 MED ORDER — SODIUM CHLORIDE 0.9 % IV BOLUS
1000.0000 mL | Freq: Once | INTRAVENOUS | Status: AC
  Administered 2019-01-11: 1000 mL via INTRAVENOUS

## 2019-01-11 MED ORDER — ALBUTEROL SULFATE (2.5 MG/3ML) 0.083% IN NEBU: 2.5000 mg | INHALATION_SOLUTION | RESPIRATORY_TRACT | Status: DC | PRN

## 2019-01-11 MED ORDER — BENZONATATE 100 MG PO CAPS
200.0000 mg | ORAL_CAPSULE | Freq: Three times a day (TID) | ORAL | Status: DC
  Administered 2019-01-11 – 2019-01-15 (×12): 200 mg via ORAL
  Filled 2019-01-11 (×12): qty 2

## 2019-01-11 NOTE — ED Triage Notes (Addendum)
Pt arrives via EMS from [facility]. Pt seen yesterday and dx with bronchitis. Pt reports SOB today. Per EMS: Pt was 86% on RA upon arrival.Pt placed on NRB by EMS. O2 sat 98% on NRB.  Pt reports cough and shortness of breath for a few days worsening today. Pt endorses chest pain worsening with cough. EMS administered 2 Duonebs for wheezing and rhonchi. 125mg  Solu-Medrol IV.

## 2019-01-11 NOTE — ED Notes (Signed)
Bed: QI29 Expected date:  Expected time:  Means of arrival:  Comments: EMS 58yo resp distress WILL NEED A ROOM

## 2019-01-11 NOTE — ED Provider Notes (Signed)
[facility] Provider Note MRN:  [id number]  Arrival date & time: 01/11/19     Chief Complaint   Shortness of Breath   History of Present Illness   Steven CuriaClay Daniels is a 58 y.o. year-old male with a history of tobacco abuse, bipolar disorder presenting to the ED with chief complaint of shortness of breath.  Patient has been experiencing cough and cold-like symptoms for 2 to 3 weeks, was getting better but then became worse a few days ago.  Was seen in the emergency department yesterday, admission was offered but he preferred to go home and try oral antibiotics.  Here because he continues to feel unwell, wants to be admitted.  Endorsing shortness of breath, continued cough productive of yellow sputum, nasal congestion.  General malaise, denies fever, no abdominal pain, no chest pain.  Symptoms are constant, no exacerbating relieving factors.  Review of Systems  A complete 10 system review of systems was obtained and all systems are negative except as noted in the HPI and PMH.   Patient's Health History    Past Medical History:  Diagnosis Date  . Alcohol abuse   . Bipolar affective disorder (HCC)   . Depression   . Hard of hearing   . Heart murmur    per pt 12/28/15  . Hepatitis C   . Hypertension   . Seizures (HCC)     Past Surgical History:  Procedure Laterality Date  . HIP FRACTURE SURGERY Right    x 3  . KNEE SURGERY Right     Family History  Problem Relation Age of Onset  . Other Mother        brain tumor behind left ear  . Alzheimer's disease Mother   . Skin cancer Father        melatomia  . Breast cancer Maternal Grandmother   . Heart disease Paternal Uncle   . Heart disease Cousin        fathers side  . Colon cancer Neg Hx   . Esophageal cancer Neg Hx     Social History   Socioeconomic History  . Marital status: Single    Spouse name: Not on file  . Number of children: 0  . Years of education: Not on file  . Highest  education level: Not on file  Occupational History  . Occupation: disabled  Social Needs  . Financial resource strain: Not on file  . Food insecurity:    Worry: Not on file    Inability: Not on file  . Transportation needs:    Medical: Not on file    Non-medical: Not on file  Tobacco Use  . Smoking status: Current Every Day Smoker    Packs/day: 1.00    Types: Cigarettes  . Smokeless tobacco: Never Used  Substance and Sexual Activity  . Alcohol use: Yes    Frequency: Never    Comment: patient states he is in rehab  . Drug use: Not Currently    Types: Cocaine    Comment: crack, heroin -snort last use 12/27/2017  . Sexual activity: Not Currently    Birth control/protection: None  Lifestyle  . Physical activity:    Days per week: Not on file    Minutes per session: Not on file  . Stress: Not on file  Relationships  . Social connections:    Talks on phone: Not on file    Gets together: Not on file    Attends religious service: Not on  file    Active member of club or organization: Not on file    Attends meetings of clubs or organizations: Not on file    Relationship status: Not on file  . Intimate partner violence:    Fear of current or ex partner: Not on file    Emotionally abused: Not on file    Physically abused: Not on file    Forced sexual activity: Not on file  Other Topics Concern  . Not on file  Social History Narrative   Divorced, no children   Veteran of 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  States intention to quit currently started opiates after her first hip surgery   He is a smoker   11/23/2017        Physical Exam  Vital Signs and Nursing Notes reviewed Vitals:   01/11/19 1720 01/11/19 1829  BP: (!) 141/78 (!) 147/81  Pulse: 89 87  Resp: 16 (!) 22  Temp:  97.9 F (36.6 C)  SpO2: 94% 94%    CONSTITUTIONAL:  Well-appearing, NAD NEURO:  Alert and oriented x 3, no focal deficits EYES:  eyes equal and reactive ENT/NECK:  no LAD, no JVD CARDIO: Regular rate, well-perfused, normal S1 and S2 PULM: Diffuse rhonchi, scattered wheezes GI/GU:  normal bowel sounds, non-distended, non-tender MSK/SPINE:  No gross deformities, no edema SKIN:  no rash, atraumatic PSYCH:  Appropriate speech and behavior  Diagnostic and Interventional Summary    EKG Interpretation  Date/Time:  Thursday January 11 2019 14:58:22 EST Ventricular Rate:  84 PR Interval:    QRS Duration: 114 QT Interval:  385 QTC Calculation: 456 R Axis:   -15 Text Interpretation:  Sinus rhythm Incomplete right bundle branch block ST elevation suggests acute pericarditis Confirmed by Kennis Carina [phone]) on 01/11/2019 3:16:07 PM      Labs Reviewed  CBC - Abnormal; Notable for the following components:      Result Value   WBC 21.6 (*)    All other components within normal limits  BASIC METABOLIC PANEL - Abnormal; Notable for the following components:   Glucose, Bld 187 (*)    BUN 26 (*)    All other components within normal limits  EXPECTORATED SPUTUM ASSESSMENT W REFEX TO RESP CULTURE  INFLUENZA PANEL BY PCR (TYPE A & B)  RAPID URINE DRUG SCREEN, HOSP PERFORMED  TSH  T3  T4, FREE  HIV ANTIBODY (ROUTINE TESTING W REFLEX)  I-STAT TROPONIN, ED    DG Chest 2 View  Final Result    US THYROID    (Results Pending)    Medications  propranolol (INDERAL) tablet 40 mg (has no administration in time range)  traZODone (DESYREL) tablet 50 mg (has no administration in time range)  gabapentin (NEURONTIN) capsule 300 mg (has no administration in time range)  thiamine (VITAMIN B-1) tablet 100 mg (has no administration in time range)  cefTRIAXone (ROCEPHIN) 1 g in sodium chloride 0.9 % 100 mL IVPB (has no administration in time range)  budesonide (PULMICORT) nebulizer solution 2 mg (has no administration in time range)    ipratropium-albuterol (DUONEB) 0.5-2.5 (3) MG/3ML nebulizer solution 3 mL (has no administration in time range)  methylPREDNISolone sodium succinate (SOLU-MEDROL) 125 mg/2 mL injection 60 mg (has no administration in time range)  benzonatate (TESSALON) capsule 200 mg (has no administration in time range)  guaiFENesin (MUCINEX) 12  hr tablet 600 mg (has no administration in time range)  albuterol (PROVENTIL) (2.5 MG/3ML) 0.083% nebulizer solution 2.5 mg (has no administration in time range)  nicotine (NICODERM CQ - dosed in mg/24 hours) patch 21 mg (has no administration in time range)  sodium chloride 0.9 % bolus 1,000 mL (0 mLs Intravenous Stopped 01/11/19 1728)  ipratropium-albuterol (DUONEB) 0.5-2.5 (3) MG/3ML nebulizer solution 3 mL (3 mLs Nebulization Given 01/11/19 1556)     Procedures Critical Care  ED Course and Medical Decision Making  I have reviewed the triage vital signs and the nursing notes.  Pertinent labs & imaging results that were available during my care of the patient were reviewed by me and considered in my medical decision making (see below for details).  New oxygen requirement in this 58 year old male with history of tobacco abuse, likely undiagnosed COPD, likely viral illness, will swab for flu, will repeat x-ray to exclude pneumonia.  Anticipating admission.  Admitted to hospital service for further care.  Elmer Sow. Pilar Plate, MD [facility] Emergency Medicine [facility] [email or web address]  Final Clinical Impressions(s) / ED Diagnoses     ICD-10-CM   1. Hypoxia R09.02   2. Cough R05 DG Chest 2 View    DG Chest 2 View  3. Thyroid mass E07.9 US THYROID    US THYROID  4. Viral illness B34.9     ED Discharge Orders    None         Sabas Sous, MD 01/11/19 [phone]

## 2019-01-11 NOTE — ED Notes (Signed)
ED TO INPATIENT HANDOFF REPORT  Name/Age/Gender [facility] 58 y.o. male  Code Status Code Status History    Date Active Date Inactive Code Status Order ID Comments User Context   02/19/2018 2340 03/01/2018 1156 Full Code [id number]  Jackelyn Poling, NP Inpatient   02/18/2018 1603 02/19/2018 2244 Full Code [id number]  Lorre Nick, MD ED   04/06/2017 1515 04/06/2017 2122 Full Code [id number]  Alvira Monday, MD ED   12/28/2015 2134 01/01/2016 1651 Full Code [id number]  Worthy Flank, NP Inpatient   12/28/2015 1222 12/28/2015 2134 Full Code [id number]  Dub Mikes, RN ED      Home/SNF/Other Home  Chief Complaint Res distress   Level of Care/Admitting Diagnosis ED Disposition    ED Disposition Condition Comment   Admit  Hospital Area: [facility] [[id number]]  Level of Care: Telemetry [5]  Admit to tele based on following criteria: Other see comments  Comments: hypoxia  Diagnosis: Acute respiratory failure (HCC) [518.81.ICD-9-CM]  Admitting Physician: RAI, RIPUDEEP K [[id number]]  Attending Physician: RAI, RIPUDEEP K [[id number]]  Estimated length of stay: past midnight tomorrow  Certification:: I certify this patient will need inpatient services for at least 2 midnights  PT Class (Do Not Modify): Inpatient [101]  PT Acc Code (Do Not Modify): Private [1]       Medical History Past Medical History:  Diagnosis Date  . Alcohol abuse   . Bipolar affective disorder (HCC)   . Depression   . Hard of hearing   . Heart murmur    per pt 12/28/15  . Hepatitis C   . Hypertension   . Seizures (HCC)     Allergies Allergies  Allergen Reactions  . Other Shortness Of Breath    Reaction to cats  . Apple Other (See Comments)    Raw apples cause gum swelling  . Cherry Other (See Comments)    Raw cherries cause gum swelling  . Lisinopril Hives    Possible reaction to lisinopril Possible reaction to lisinopril  . Penicillins Other (See Comments)    Allergic reaction as a 58  year old Has patient had a PCN reaction causing immediate rash, facial/tongue/throat swelling, SOB or lightheadedness with hypotension: no Has patient had a PCN reaction causing severe rash involving mucus membranes or skin necrosis: no Has patient had a PCN reaction that required hospitalization no Has patient had a PCN reaction occurring within the last 10 years: no If all of the above answers are "NO", then may proceed with Cephalosporin use.  Marland Kitchen Plum Pulp Other (See Comments)    Raw plums cause gum swelling    IV Location/Drains/Wounds Patient Lines/Drains/Airways Status   Active Line/Drains/Airways    Name:   Placement date:   Placement time:   Site:   Days:   Peripheral IV 01/11/19 Left Hand   01/11/19    -    Hand   less than 1          Labs/Imaging Results for orders placed or performed during the hospital encounter of 01/11/19 (from the past 48 hour(s))  CBC     Status: Abnormal   Collection Time: 01/11/19  3:36 PM  Result Value Ref Range   WBC 21.6 (H) 4.0 - 10.5 K/uL   RBC 5.30 4.22 - 5.81 MIL/uL   Hemoglobin 15.6 13.0 - 17.0 g/dL   HCT 03.5 46.5 - 68.1 %   MCV 95.7 80.0 - 100.0 fL   MCH 29.4 26.0 - 34.0 pg  MCHC 30.8 30.0 - 36.0 g/dL   RDW 07.6 80.8 - 81.1 %   Platelets 275 150 - 400 K/uL   nRBC 0.0 0.0 - 0.2 %    Comment: Performed at [facility], [address] [place], [place] [address]  Basic metabolic panel     Status: Abnormal   Collection Time: 01/11/19  3:36 PM  Result Value Ref Range   Sodium 138 135 - 145 mmol/L   Potassium 4.8 3.5 - 5.1 mmol/L   Chloride 99 98 - 111 mmol/L   CO2 31 22 - 32 mmol/L   Glucose, Bld 187 (H) 70 - 99 mg/dL   BUN 26 (H) 6 - 20 mg/dL   Creatinine, Ser 4.58 0.61 - 1.24 mg/dL   Calcium 9.3 8.9 - 59.2 mg/dL   GFR calc non Af Amer >60 >60 mL/min   GFR calc Af Amer >60 >60 mL/min   Anion gap 8 5 - 15    Comment: Performed at [facility], [address] [place], [place] [address]   Influenza panel by PCR (type A & B)     Status: None   Collection Time: 01/11/19  3:36 PM  Result Value Ref Range   Influenza A By PCR NEGATIVE NEGATIVE   Influenza B By PCR NEGATIVE NEGATIVE    Comment: (NOTE) The Xpert Xpress Flu assay is intended as an aid in the diagnosis of  influenza and should not be used as a sole basis for treatment.  This  assay is FDA approved for nasopharyngeal swab specimens only. Nasal  washings and aspirates are unacceptable for Xpert Xpress Flu testing. Performed at [facility], [address], [place], [place] [address]   I-stat troponin, ED     Status: None   Collection Time: 01/11/19  3:46 PM  Result Value Ref Range   Troponin i, poc 0.00 0.00 - 0.08 ng/mL   Comment 3            Comment: Due to the release kinetics of cTnI, a negative result within the first hours of the onset of symptoms does not rule out myocardial infarction with certainty. If myocardial infarction is still suspected, repeat the test at appropriate intervals.    Dg Chest 2 View  Result Date: 01/11/2019 CLINICAL DATA:  Cough, shortness of breath. EXAM: CHEST - 2 VIEW COMPARISON:  Radiographs of January 10, 2019. FINDINGS: Stable cardiomegaly. No pneumothorax or pleural effusion is noted. No acute pulmonary disease is noted. Bony thorax is unremarkable. IMPRESSION: No active cardiopulmonary disease. Electronically Signed   By: Lupita Raider, M.D.   On: 01/11/2019 15:51   Dg Chest 2 View  Result Date: 01/10/2019 CLINICAL DATA:  Worsening cough. EXAM: CHEST - 2 VIEW COMPARISON:  Chest x-ray 12/20/2017 and CT chest, same date. FINDINGS: The heart is mildly enlarged but stable. Stable tortuosity of the thoracic aorta. 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 Course interstitial markings and peribronchial thickening suggesting bronchitis. No definite infiltrates or effusions. IMPRESSION: 1. Stable cardiac enlargement. 2. Findings suggest bronchitis.  No  infiltrates or effusions. Electronically Signed   By: Rudie Meyer M.D.   On: 01/10/2019 13:46   Ct Angio Chest Pe W/cm &/or Wo Cm  Result Date: 01/10/2019 CLINICAL DATA:  Chest pain, rule out PE EXAM: CT ANGIOGRAPHY CHEST WITH CONTRAST TECHNIQUE: Multidetector CT imaging of the chest was performed using the standard protocol during bolus administration of intravenous contrast. Multiplanar CT image reconstructions and  MIPs were obtained to evaluate the vascular anatomy. CONTRAST:  <See Chart> ISOVUE-370 IOPAMIDOL (ISOVUE-370) INJECTION 76% COMPARISON:  12/20/2018 FINDINGS: Cardiovascular: Satisfactory opacification of the pulmonary arteries to the segmental level. No evidence of pulmonary embolism. Scattered coronary artery calcifications. Cardiomegaly. No pericardial effusion. Mediastinum/Nodes: No enlarged mediastinal, hilar, or axillary lymph nodes. Redemonstrated large exophytic nodule of the inferior pole of the right lobe of the thyroid measuring at least 6.3 cm extending into the superior mediastinum. Trachea and esophagus demonstrate no significant findings. Lungs/Pleura: Bibasilar scarring or atelectasis. Occasional tiny pulmonary nodules as seen on prior examination, for example a 2 mm nodule of the right upper lobe (series 7, image 29). No pleural effusion or pneumothorax. Upper Abdomen: No acute abnormality. Musculoskeletal: No chest wall abnormality. No acute or significant osseous findings. Review of the MIP images confirms the above findings. IMPRESSION: 1.  Negative examination for pulmonary embolism. 2.  Cardiomegaly. 3. Redemonstrated large exophytic nodule of the inferior pole of the right lobe of the thyroid measuring at least 6.3 cm extending into the superior mediastinum. As on prior examination, recommend nonemergent ultrasound examination of the thyroid to further characterize. 4. Occasional tiny pulmonary nodules as seen on prior CT. As on prior, CT follow-up is optional for these  nodules at 12 months if high risk factors for lung cancer are present. Electronically Signed   By: Lauralyn PrimesAlex  Bibbey M.D.   On: 01/10/2019 16:52    Pending Labs [employer] CorporationUnresulted Labs (From admission, onward)    Start     Ordered   Signed and Held  HIV antibody  Once,   R     Signed and Held   Signed and Held  Culture, sputum-assessment  Once,   R     Signed and Held          Vitals/Pain Today's Vitals   01/11/19 1558 01/11/19 1600 01/11/19 1648 01/11/19 1720  BP: (!) 165/78 (!) 165/78 139/85 (!) 141/78  Pulse: 83 82 80 89  Resp:  16 17 16   Temp: 98.2 F (36.8 C)     TempSrc:      SpO2: 96% 94% 91% 94%  Weight:      Height:      PainSc:        Isolation Precautions Droplet precaution  Medications Medications  sodium chloride 0.9 % bolus 1,000 mL (0 mLs Intravenous Stopped 01/11/19 1728)  ipratropium-albuterol (DUONEB) 0.5-2.5 (3) MG/3ML nebulizer solution 3 mL (3 mLs Nebulization Given 01/11/19 1556)    Mobility walks

## 2019-01-11 NOTE — H&P (Signed)
History and Physical        Hospital Admission Note Date: 01/11/2019  Patient name: Steven Daniels Medical record number: [id number] Date of birth: 05/10/1961 Age: 58 y.o. Gender: male  PCP: [facility]    Patient coming from: [facility]  I have reviewed all records in the [facility].    Chief Complaint:  Shortness of breath with wheezing for the past 3 weeks  HPI: Patient is a 58 year old male with history of alcohol abuse currently undergoing detox at [facility], bipolar disorder, depression, nicotine abuse, polysubstance abuse presented to ED with shortness of breath, diffuse wheezing for last 3 weeks.  Per patient he had flu 1 month ago and since then he has bronchitis, pleuritic chest pain, coughing productive with mucus and fatigue.  Patient was seen by his PCP and was started on antibiotics, he took the entire course of antibiotics however symptoms reoccurred in the last week.  Patient was seen in ED yesterday on 2/12, he was hypoxic on ambulating, patient was offered admission which he declined.  Patient was discharged home with prednisone taper and doxycycline. [facility] with shortness of breath, per EMS his O2 sats were 86% on room air and was placed on nonrebreather.  At the time of my evaluation, patient's O2 sats were 78% on room air.  He received duo nebs and Solu-Medrol 125 mg IV x1. Has been a heavy smoker 1 pack/day for almost 30 years but states he is quitting.  ED work-up/course:  In ED, temp 98.2 F, respiratory rate 22, BP 135/81, O2 sats 94% on 4 L Influenza panel negative Sodium 138, potassium 4.8, BUN 26, creatinine 0.8, WBCs 21.6, hemoglobin 15.6 CT angio chest done on 2/12 showed no PE, cardiomegaly, large exophytic nodule of the inferior pole of the right lobe of  thyroid measuring 6.3 cm extending into the superior mediastinum, recommended nonemergent ultrasound examination of the thyroid, tiny pulmonary nodules as seen on prior CT, recommended optional CT at 12 months.   Review of Systems: Positives marked in 'bold' Constitutional: Denies fever, chills, diaphoresis,+ poor appetite and fatigue.  HEENT: Denies photophobia, eye pain, redness, hearing loss, ear pain, congestion, sore throat, rhinorrhea, sneezing,  Denies mouth sores, trouble swallowing, neck pain, neck stiffness and tinnitus.   Respiratory: Please see HPI Cardiovascular: Denies  palpitations and leg swelling.  Gastrointestinal: Denies nausea, vomiting, abdominal pain, diarrhea, constipation, blood in stool and abdominal distention.  Genitourinary: Denies dysuria, urgency, frequency, hematuria, flank pain and difficulty urinating.  Musculoskeletal: Denies myalgias, back pain, joint swelling, arthralgias and gait problem.  Skin: Denies pallor, rash and wound.  Neurological: Denies dizziness, seizures, syncope, weakness, light-headedness, numbness and headaches.  Hematological: Denies adenopathy. Easy bruising, personal or family bleeding history  Psychiatric/Behavioral: Denies suicidal ideation, mood changes, confusion, nervousness, sleep disturbance and agitation  Past Medical History: Past Medical History:  Diagnosis Date  . Alcohol abuse   . Bipolar affective disorder (HCC)   . Depression   . Hard of hearing   . Heart murmur    per pt 12/28/15  . Hepatitis C   . Hypertension   .  Seizures (HCC)     Past Surgical History:  Procedure Laterality Date  . HIP FRACTURE SURGERY Right    x 3  . KNEE SURGERY Right     Medications: Prior to Admission medications   Medication Sig Start Date End Date Taking? Authorizing Provider  albuterol (PROVENTIL HFA;VENTOLIN HFA) 108 (90 Base) MCG/ACT inhaler Inhale 2 puffs into the lungs 4 (four) times daily as needed. 12/20/18   [provider]  chlordiazePOXIDE (LIBRIUM) 25 MG capsule 50mg  PO TID x 1D, then 25-50mg  PO BID X 1D, then 25-50mg  PO QD X 1D Patient not taking: Reported on 01/10/2019 06/07/18   Derwood Kaplan, MD  clindamycin (CLEOCIN) 150 MG capsule Take 2 capsules (300 mg total) by mouth 3 (three) times daily. Patient not taking: Reported on 01/10/2019 07/21/18   Arthor Captain, PA-C  diazepam (VALIUM) 5 MG/ML injection Inject 10 mg into the vein as needed (Seizures). Prn for seizures, may repeat in 30 seconds if symptoms presist    [provider]  doxycycline (VIBRAMYCIN) 100 MG capsule Take 1 capsule (100 mg total) by mouth 2 (two) times daily for 7 days. 01/10/19 01/17/19  Khatri, Hina, PA-C  FLUoxetine (PROZAC) 20 MG capsule Take 1 capsule (20 mg total) by mouth daily. For mood control Patient not taking: Reported on 01/10/2019 06/11/18   Linus Mako B, NP  gabapentin (NEURONTIN) 300 MG capsule Take 1 capsule (300 mg total) by mouth 3 (three) times daily. 06/11/18   Georgetta Haber, NP  guaiFENesin (MUCINEX) 600 MG 12 hr tablet Take 600 mg by mouth 2 (two) times daily. 9-5 Congestion    [provider]  ibuprofen (ADVIL,MOTRIN) 600 MG tablet Take 600 mg by mouth every 6 (six) hours as needed for fever or moderate pain.    [provider]  ipratropium-albuterol (DUONEB) 0.5-2.5 (3) MG/3ML SOLN Take 3 mLs by nebulization 2 (two) times daily. For 3 days 01/10/19 01/13/19  [provider]  Multiple Vitamin (MULTI VITAMIN) TABS Take 1 tablet by mouth daily at 12 noon. Folic acid    [provider]  omega-3 acid ethyl esters (LOVAZA) 1 g capsule Take 3 g by mouth 2 (two) times daily.    [provider]  potassium chloride SA (K-DUR,KLOR-CON) 20 MEQ tablet Take 1 tablet (20 mEq total) by mouth daily. Patient not taking: Reported on 01/10/2019 02/28/18   Money, Gerlene Burdock, FNP  predniSONE (STERAPRED UNI-PAK 21 TAB) 10 MG (21) TBPK tablet Take by mouth daily. Take 6 tabs  by mouth daily  for 2 days, then 5 tabs for 2 days, then 4 tabs for 2 days, then 3 tabs for 2 days, 2 tabs for 2 days, then 1 tab by mouth daily for 2 days 01/10/19   Dietrich Pates, PA-C  propranolol (INDERAL) 80 MG tablet Take 1 tablet (80 mg total) by mouth daily. Patient taking differently: Take 40 mg by mouth 2 (two) times daily.  06/11/18   Georgetta Haber, NP  QUEtiapine (SEROQUEL) 200 MG tablet Take 1 tablet (200 mg total) by mouth at bedtime. For mood control Patient not taking: Reported on 01/10/2019 06/11/18   Linus Mako B, NP  thiamine (VITAMIN B-1) 100 MG tablet Take 100 mg by mouth daily.    [provider]  traZODone (DESYREL) 50 MG tablet Take 50 mg by mouth at bedtime as needed (insomnia).    [provider]  umeclidinium-vilanterol (ANORO ELLIPTA) 62.5-25 MCG/INH AEPB Inhale 1 puff into the lungs daily.  [provider]    Allergies:   Allergies  Allergen Reactions  . Other Shortness Of Breath    Reaction to cats  . Apple Other (See Comments)    Raw apples cause gum swelling  . Cherry Other (See Comments)    Raw cherries cause gum swelling  . Lisinopril Hives    Possible reaction to lisinopril Possible reaction to lisinopril  . Penicillins Other (See Comments)    Allergic reaction as a 58 year old Has patient had a PCN reaction causing immediate rash, facial/tongue/throat swelling, SOB or lightheadedness with hypotension: no Has patient had a PCN reaction causing severe rash involving mucus membranes or skin necrosis: no Has patient had a PCN reaction that required hospitalization no Has patient had a PCN reaction occurring within the last 10 years: no If all of the above answers are "NO", then may proceed with Cephalosporin use.  Marland Kitchen Plum Pulp Other (See Comments)    Raw plums cause gum swelling    Social History:  reports that he has been smoking cigarettes. He has been smoking about 1.00 pack per day. He has never used smokeless  tobacco. He reports current alcohol use. He reports previous drug use. Drug: Cocaine.  Family History: Family History  Problem Relation Age of Onset  . Other Mother        brain tumor behind left ear  . Alzheimer's disease Mother   . Skin cancer Father        melatomia  . Breast cancer Maternal Grandmother   . Heart disease Paternal Uncle   . Heart disease Cousin        fathers side  . Colon cancer Neg Hx   . Esophageal cancer Neg Hx     Physical Exam: Blood pressure (!) 141/78, pulse 89, temperature 98.2 F (36.8 C), resp. rate 16, height 6\' 2"  (1.88 m), weight 127 kg, SpO2 94 %. General: Alert, awake, oriented x3, in no acute distress. Eyes: pink conjunctiva,anicteric sclera, pupils equal and reactive to light and accomodation, HEENT: normocephalic, atraumatic, oropharynx clear Neck: supple, no masses or lymphadenopathy, no goiter, no bruits, no JVD CVS: Regular rate and rhythm, without murmurs, rubs or gallops. No lower extremity edema Resp : Diffuse expiratory wheezing bilaterally GI : Soft, nontender, nondistended, positive bowel sounds, no masses. No hepatomegaly. No hernia.  Musculoskeletal: No clubbing or cyanosis, positive pedal pulses. No contracture. ROM intact  Neuro: Grossly intact, no focal neurological deficits, strength 5/5 upper and lower extremities bilaterally Psych: alert and oriented x 3, normal mood and affect Skin: no rashes or lesions, warm and dry   LABS on Admission: I have personally reviewed all the labs and imagings below    Basic Metabolic Panel: Recent Labs  Lab 01/10/19 1249 01/10/19 1525 01/11/19 1536  NA 138 137 138  K 5.0 4.9 4.8  CL 98  --  99  CO2 29  --  31  GLUCOSE 122*  --  187*  BUN 14  --  26*  CREATININE 0.68  --  0.80  CALCIUM 9.6  --  9.3   Liver Function Tests: No results for input(s): AST, ALT, ALKPHOS, BILITOT, PROT, ALBUMIN in the last 168 hours. No results for input(s): LIPASE, AMYLASE in the last 168 hours. No  results for input(s): AMMONIA in the last 168 hours. CBC: Recent Labs  Lab 01/10/19 1249 01/10/19 1525 01/11/19 1536  WBC 12.7*  --  21.6*  HGB 16.0 17.7* 15.6  HCT 51.8 52.0 50.7  MCV 95.9  --  95.7  PLT 218  --  275   Cardiac Enzymes: No results for input(s): CKTOTAL, CKMB, CKMBINDEX, TROPONINI in the last 168 hours. BNP: Invalid input(s): POCBNP CBG: No results for input(s): GLUCAP in the last 168 hours.  Radiological Exams on Admission:  Dg Chest 2 View  Result Date: 01/11/2019 CLINICAL DATA:  Cough, shortness of breath. EXAM: CHEST - 2 VIEW COMPARISON:  Radiographs of January 10, 2019. FINDINGS: Stable cardiomegaly. No pneumothorax or pleural effusion is noted. No acute pulmonary disease is noted. Bony thorax is unremarkable. IMPRESSION: No active cardiopulmonary disease. Electronically Signed   By: Lupita Raider, M.D.   On: 01/11/2019 15:51   Dg Chest 2 View  Result Date: 01/10/2019 CLINICAL DATA:  Worsening cough. EXAM: CHEST - 2 VIEW COMPARISON:  Chest x-ray 12/20/2017 and CT chest, same date. FINDINGS: The heart is mildly enlarged but stable. Stable tortuosity of the thoracic aorta. 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 Course interstitial markings and peribronchial thickening suggesting bronchitis. No definite infiltrates or effusions. IMPRESSION: 1. Stable cardiac enlargement. 2. Findings suggest bronchitis.  No infiltrates or effusions. Electronically Signed   By: Rudie Meyer M.D.   On: 01/10/2019 13:46   Ct Angio Chest Pe W/cm &/or Wo Cm  Result Date: 01/10/2019 CLINICAL DATA:  Chest pain, rule out PE EXAM: CT ANGIOGRAPHY CHEST WITH CONTRAST TECHNIQUE: Multidetector CT imaging of the chest was performed using the standard protocol during bolus administration of intravenous contrast. Multiplanar CT image reconstructions and MIPs were obtained to evaluate the vascular anatomy. CONTRAST:  <See Chart> ISOVUE-370 IOPAMIDOL (ISOVUE-370) INJECTION 76% COMPARISON:   12/20/2018 FINDINGS: Cardiovascular: Satisfactory opacification of the pulmonary arteries to the segmental level. No evidence of pulmonary embolism. Scattered coronary artery calcifications. Cardiomegaly. No pericardial effusion. Mediastinum/Nodes: No enlarged mediastinal, hilar, or axillary lymph nodes. Redemonstrated large exophytic nodule of the inferior pole of the right lobe of the thyroid measuring at least 6.3 cm extending into the superior mediastinum. Trachea and esophagus demonstrate no significant findings. Lungs/Pleura: Bibasilar scarring or atelectasis. Occasional tiny pulmonary nodules as seen on prior examination, for example a 2 mm nodule of the right upper lobe (series 7, image 29). No pleural effusion or pneumothorax. Upper Abdomen: No acute abnormality. Musculoskeletal: No chest wall abnormality. No acute or significant osseous findings. Review of the MIP images confirms the above findings. IMPRESSION: 1.  Negative examination for pulmonary embolism. 2.  Cardiomegaly. 3. Redemonstrated large exophytic nodule of the inferior pole of the right lobe of the thyroid measuring at least 6.3 cm extending into the superior mediastinum. As on prior examination, recommend nonemergent ultrasound examination of the thyroid to further characterize. 4. Occasional tiny pulmonary nodules as seen on prior CT. As on prior, CT follow-up is optional for these nodules at 12 months if high risk factors for lung cancer are present. Electronically Signed   By: Lauralyn Primes M.D.   On: 01/10/2019 16:52      EKG: Independently reviewed.  Sinus tachycardia, questionable ST elevation suggesting pericarditis   Assessment/Plan Principal Problem:   Acute respiratory failure with hypoxia (HCC) with acute bronchitis, likely has underlying COPD with acute exacerbation -Still hypoxic and diffusely wheezing, O2 sat 78% on room air during my examination -CTA chest negative for PE, influenza panel negative, chest x-ray with  no infiltrates - placed on scheduled duo nebs, IV Solu-Medrol 60 mg every 6 hours, flutter valve -Placed on IV Rocephin, Pulmicort, antitussives  Active Problems:   Cigarette smoker -  Patient counseled on smoking cessation, placed on nicotine patch  Incidental finding of thyroid mass -Obtain TSH, free T4, T3, thyroid ultrasound  History of alcohol abuse -Currently in [facility], last drink was a month ago  Abnormal EKG -Obtain repeat EKG in a.m., troponins negative  History of polysubstance abuse - will check urine tox screen  Tiny pulmonary nodule seen on CT -Outpatient follow-up in 12 months   DVT prophylaxis: Lovenox  CODE STATUS: Full code  Consults called: None  Family Communication: Admission, patients condition and plan of care including tests being ordered have been discussed with the patient and who indicates understanding and agree with the plan and Code Status  Admission status: Inpatient, telemetry due to hypoxia  Disposition plan: Further plan will depend as patient's clinical course evolves and further radiologic and laboratory data become available.    At the time of admission, it appears that the appropriate admission status for this patient is INPATIENT . This is judged to be reasonable and necessary in order to provide the required intensity of service to ensure the patient's safety given the presenting symptoms hypoxia with diffuse wheezing, physical exam findings, and initial radiographic and laboratory data in the context of their chronic comorbidities.  The medical decision making on this patient was of high complexity and the patient is at high risk for clinical deterioration, therefore this is a level 3 visit.   Time Spent on Admission: 60 minutes    Wrangler Penning M.D. [facility] 01/11/2019, 5:21 PM

## 2019-01-11 NOTE — ED Notes (Signed)
Report attempted to unit x1 at this time

## 2019-01-12 ENCOUNTER — Inpatient Hospital Stay (HOSPITAL_COMMUNITY): Payer: Self-pay

## 2019-01-12 DIAGNOSIS — R0902 Hypoxemia: Secondary | ICD-10-CM

## 2019-01-12 LAB — T3: T3, Total: 90 ng/dL (ref 71–180)

## 2019-01-12 LAB — HIV ANTIBODY (ROUTINE TESTING W REFLEX): HIV Screen 4th Generation wRfx: NONREACTIVE

## 2019-01-12 MED ORDER — AZITHROMYCIN 250 MG PO TABS
500.0000 mg | ORAL_TABLET | Freq: Every day | ORAL | Status: DC
  Administered 2019-01-12 – 2019-01-15 (×4): 500 mg via ORAL
  Filled 2019-01-12 (×4): qty 2

## 2019-01-12 MED ORDER — ENOXAPARIN SODIUM 40 MG/0.4ML ~~LOC~~ SOLN
40.0000 mg | SUBCUTANEOUS | Status: DC
  Administered 2019-01-12 – 2019-01-14 (×3): 40 mg via SUBCUTANEOUS
  Filled 2019-01-12 (×3): qty 0.4

## 2019-01-12 NOTE — Progress Notes (Signed)
Clinical Social Worker following patient for support and discharge needs. Patient is from [facility] and has been at facility for over a week. Patient stated facility is aware that he is in the hospital and has save his bed for him. Patient stated that facility will pick him up from the hospital and will take him back to facility. Patient stated he does not have any social work needs. CSW signing off.  Marrianne Mood, MSW,  Theresia Majors [phone]

## 2019-01-12 NOTE — Progress Notes (Signed)
[facility]                                                                              Patient Demographics  Steven Daniels, is a 58 y.o. male, DOB - 01/29/1961, WUJ:[id number]  Admit date - 01/11/2019   Admitting Physician  Steven LuoK , MD  Outpatient Primary MD for the patient is [facility]  Outpatient specialists:   LOS - 1  days   Medical records reviewed and are as summarized below:    Chief Complaint  Patient presents with  . Shortness of Breath       Brief summary   Patient is a 58 year old male with history of alcohol abuse currently undergoing detox at [facility], bipolar disorder, depression, nicotine abuse, polysubstance abuse presented to ED with shortness of breath, diffuse wheezing for last 3 weeks.  Per patient he had flu 1 month ago and since then he has bronchitis, pleuritic chest pain, coughing productive with mucus and fatigue.  Patient was seen by his PCP, started on antibiotics, he took the entire course of antibiotics however symptoms reoccurred in the last week. He was seen in ED on 2/12, he was hypoxic on ambulating, patient was offered admission which he declined.  Patient was discharged home with prednisone taper and doxycycline. [facility] of [facility], per EMS his O2 sats were 86% on room air and was placed on nonrebreather, 78% on room air on exam  Has been a heavy smoker 1 pack/day for almost 30 years but states he is quitting.   Assessment & Plan    Principal Problem:   Acute respiratory failure with hypoxia (HCC) with acute bronchitis, likely has underlying COPD with acute exacerbation -Still hypoxic and diffusely wheezing, O2 sats 92% on 4 L.,  Overnight hypoxic and was placed on CPAP  -Had long discussion with the patient for smoking cessation, will need outpatient PFTs -CTA chest negative for PE, flu panel negative, chest x-ray with  no infiltrates -Continue scheduled nebs, IV Solu-Medrol, flutter valve, Pulmicort, antitussives -Added Zithromax, continue IV Rocephin  Active Problems:   Cigarette smoker -Patient counseled on smoking cessation, continue nicotine patch  Incidental finding of thyroid mass/multinodular goiter -TSH low 0.12 however free T4 0.8, T3 90 -Thyroid ultrasound suggestive of multinodular goiter, recommended percutaneous sampling.  Discussed in detail with the patient, outpatient further work-up with PCP  History of alcohol abuse -[facility]  Abnormal EKG -Troponins negative, EKG with no acute ST-T wave changes  History of polysubstance abuse -Urine tox screen positive for benzodiazepines  Tiny pulmonary nodule seen on CT -Outpatient follow-up in 12 months  underlying OSA/nocturnal hypoxia -Outpatient sleep study recommended  Code Status: Full CODE STATUS DVT Prophylaxis: Lovenox Family Communication: Discussed in detail with the patient, all imaging results, lab results explained to the patient    Disposition Plan: Will need 24 to 48 hours treatment of IV steroids, IV antibiotics, diffusely wheezing  Time Spent in minutes   35 minutes  Procedures:  None  Consultants:   None  Antimicrobials:  Anti-infectives (From admission, onward)   Start     Dose/Rate Route Frequency Ordered Stop   01/12/19 1000  azithromycin (ZITHROMAX) tablet 500 mg     500 mg Oral Daily 01/12/19 0908     01/11/19 2000  cefTRIAXone (ROCEPHIN) 1 g in sodium chloride 0.9 % 100 mL IVPB     1 g 200 mL/hr over 30 Minutes Intravenous Every 24 hours 01/11/19 1821 01/16/19 1959          Medications  Scheduled Meds: . azithromycin  500 mg Oral Daily  . benzonatate  200 mg Oral TID  . budesonide (PULMICORT) nebulizer solution  2 mg Nebulization Q6H  . enoxaparin (LOVENOX) injection  40 mg Subcutaneous Q24H  . gabapentin  300 mg Oral TID  . guaiFENesin  600  mg Oral BID  . ipratropium-albuterol  3 mL Nebulization Q4H  . methylPREDNISolone (SOLU-MEDROL) injection  60 mg Intravenous Q6H  . nicotine  21 mg Transdermal Daily  . propranolol  40 mg Oral BID  . thiamine  100 mg Oral Daily   Continuous Infusions: . cefTRIAXone (ROCEPHIN)  IV 1 g (01/11/19 2140)   PRN Meds:.albuterol, traZODone      Subjective:   Steven Daniels was seen and examined today.  Overnight issues with hypoxia, was eventually placed on CPAP.  Still diffusely wheezing and short of breath.  No chest pain.  Patient denies dizziness, abdominal pain, N/V/D/C, new weakness, numbess, tingling.   Objective:   Vitals:   01/12/19 0054 01/12/19 0620 01/12/19 0753 01/12/19 1203  BP:  (!) 128/95    Pulse:  69    Resp:  18    Temp:  97.8 F (36.6 C)    TempSrc:  Oral    SpO2: 95% (!) 79% 92% 92%  Weight:      Height:        Intake/Output Summary (Last 24 hours) at 01/12/2019 1328 Last data filed at 01/12/2019 1228 Gross per 24 hour  Intake 1460 ml  Output 1400 ml  Net 60 ml     Wt Readings from Last 3 Encounters:  01/11/19 127 kg  01/10/19 127 kg  07/21/18 99.8 kg     Exam  General: Alert and oriented x 3, NAD  Eyes:   HEENT:    Cardiovascular: S1 S2 auscultated, no rubs, murmurs or gallops. Regular rate and rhythm.  Respiratory: Bilateral expiratory diffuse wheezing  Gastrointestinal: Soft, nontender, nondistended, + bowel sounds  Ext: no pedal edema bilaterally  Neuro: No new deficits  Musculoskeletal: No digital cyanosis, clubbing  Skin: No rashes  Psych: Normal affect and demeanor, alert and oriented x3    Data Reviewed:  I have personally reviewed following labs and imaging studies  Micro Results Recent Results (from the past 240 hour(s))  Culture, sputum-assessment     Status: None   Collection Time: 01/11/19  8:10 PM  Result Value Ref Range Status   Specimen Description SPUTUM  Final   Special Requests NONE  Final   Sputum  evaluation   Final    THIS SPECIMEN IS ACCEPTABLE FOR SPUTUM CULTURE Performed at [facility], [address], [place], [place] [address]    Report Status 01/11/2019 FINAL  Final  Culture, respiratory     Status: None (Preliminary result)   Collection Time: 01/11/19  8:10 PM  Result Value Ref Range Status   Specimen Description   Final    SPUTUM Performed at [facility], [address] [place],  [place] [address]    Special Requests   Final    NONE Reflexed from [id number] Performed at [facility], [address], [place], [place] [address]    Gram Stain   Final    MODERATE WBC PRESENT, PREDOMINANTLY PMN ABUNDANT GRAM POSITIVE COCCI RARE YEAST    Culture   Final    TOO YOUNG TO READ Performed at [facility], [address] [place], [place] [address]    Report Status PENDING  Incomplete    Radiology Reports Dg Chest 2 View  Result Date: 01/11/2019 CLINICAL DATA:  Cough, shortness of breath. EXAM: CHEST - 2 VIEW COMPARISON:  Radiographs of January 10, 2019. FINDINGS: Stable cardiomegaly. No pneumothorax or pleural effusion is noted. No acute pulmonary disease is noted. Bony thorax is unremarkable. IMPRESSION: No active cardiopulmonary disease. Electronically Signed   By: Lupita Raider, M.D.   On: 01/11/2019 15:51   Dg Chest 2 View  Result Date: 01/10/2019 CLINICAL DATA:  Worsening cough. EXAM: CHEST - 2 VIEW COMPARISON:  Chest x-ray 12/20/2017 and CT chest, same date. FINDINGS: The heart is mildly enlarged but stable. Stable tortuosity of the thoracic aorta. 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 Course interstitial markings and peribronchial thickening suggesting bronchitis. No definite infiltrates or effusions. IMPRESSION: 1. Stable cardiac enlargement. 2. Findings suggest bronchitis.  No infiltrates or effusions. Electronically Signed   By: Rudie Meyer M.D.   On: 01/10/2019 13:46   Ct Angio Chest Pe  W/cm &/or Wo Cm  Result Date: 01/10/2019 CLINICAL DATA:  Chest pain, rule out PE EXAM: CT ANGIOGRAPHY CHEST WITH CONTRAST TECHNIQUE: Multidetector CT imaging of the chest was performed using the standard protocol during bolus administration of intravenous contrast. Multiplanar CT image reconstructions and MIPs were obtained to evaluate the vascular anatomy. CONTRAST:  <See Chart> ISOVUE-370 IOPAMIDOL (ISOVUE-370) INJECTION 76% COMPARISON:  12/20/2018 FINDINGS: Cardiovascular: Satisfactory opacification of the pulmonary arteries to the segmental level. No evidence of pulmonary embolism. Scattered coronary artery calcifications. Cardiomegaly. No pericardial effusion. Mediastinum/Nodes: No enlarged mediastinal, hilar, or axillary lymph nodes. Redemonstrated large exophytic nodule of the inferior pole of the right lobe of the thyroid measuring at least 6.3 cm extending into the superior mediastinum. Trachea and esophagus demonstrate no significant findings. Lungs/Pleura: Bibasilar scarring or atelectasis. Occasional tiny pulmonary nodules as seen on prior examination, for example a 2 mm nodule of the right upper lobe (series 7, image 29). No pleural effusion or pneumothorax. Upper Abdomen: No acute abnormality. Musculoskeletal: No chest wall abnormality. No acute or significant osseous findings. Review of the MIP images confirms the above findings. IMPRESSION: 1.  Negative examination for pulmonary embolism. 2.  Cardiomegaly. 3. Redemonstrated large exophytic nodule of the inferior pole of the right lobe of the thyroid measuring at least 6.3 cm extending into the superior mediastinum. As on prior examination, recommend nonemergent ultrasound examination of the thyroid to further characterize. 4. Occasional tiny pulmonary nodules as seen on prior CT. As on prior, CT follow-up is optional for these nodules at 12 months if high risk factors for lung cancer are present. Electronically Signed   By: Lauralyn Primes M.D.   On:  01/10/2019 16:52   US Thyroid  Result Date: 01/12/2019 CLINICAL DATA:  Incidental on CT. Thyroid nodules incidentally noted on chest CT performed 01/10/2019 EXAM: THYROID ULTRASOUND TECHNIQUE: Ultrasound examination of the thyroid gland and adjacent soft tissues was performed. COMPARISON:  Chest CT-01/10/2019 FINDINGS: Parenchymal Echotexture: Moderately heterogenous Isthmus: Normal in size measures  0.4 cm in diameter Right lobe: Enlarged measuring 9.6 x 4.3 x 3.5 cm Left lobe: Enlarged measuring 5.2 x 2.9 x 2.1 cm. _________________________________________________________ Estimated total number of nodules >/= 1 cm: 3 Number of spongiform nodules >/=  2 cm not described below (TR1): 0 Number of mixed cystic and solid nodules >/= 1.5 cm not described below (TR2): 0 _________________________________________________________ Nodule # 1: Location: Right; Inferior Maximum size: 6.0 cm; Other 2 dimensions: 3.2 x 2.9 cm Composition: solid/almost completely solid (2) Echogenicity: hypoechoic (2) Shape: not taller-than-wide (0) Margins: lobulated/irregular (2) Echogenic foci: none (0) ACR TI-RADS total points: 6. ACR TI-RADS risk category: TR4 (4-6 points). ACR TI-RADS recommendations: **Given size (>/= 1.5 cm) and appearance, fine needle aspiration of this moderately suspicious nodule should be considered based on TI-RADS criteria. _________________________________________________________ Nodule # 2: Location: Right; Mid Maximum size: 4.2 cm; Other 2 dimensions: 2.3 x 2.3 cm Composition: solid/almost completely solid (2) Echogenicity: isoechoic (1) Shape: not taller-than-wide (0) Margins: lobulated/irregular (2) Echogenic foci: macrocalcifications (1) ACR TI-RADS total points: 6. ACR TI-RADS risk category: TR4 (4-6 points). ACR TI-RADS recommendations: **Given size (>/= 1.5 cm) and appearance, fine needle aspiration of this moderately suspicious nodule should be considered based on TI-RADS criteria.  _________________________________________________________ Nodule # 3: Location: Left; Mid Maximum size: 1.8 cm; Other 2 dimensions: 1.5 x 1.4 cm Composition: solid/almost completely solid (2) Echogenicity: hyperechoic (1) Shape: not taller-than-wide (0) Margins: ill-defined (0) Echogenic foci: none (0) ACR TI-RADS total points: 3. ACR TI-RADS risk category: TR3 (3 points). ACR TI-RADS recommendations: *Given size (>/= 1.5 - 2.4 cm) and appearance, a follow-up ultrasound in 1 year should be considered based on TI-RADS criteria. _________________________________________________________ IMPRESSION: 1. Findings suggestive of multinodular goiter. 2. Nodules #1 and #2 both meet imaging criteria to recommend percutaneous sampling as clinically indicated. 3. Nodule #3 meets imaging criteria to recommend a 1 year follow-up. The above is in keeping with the ACR TI-RADS recommendations - J Am Coll Radiol 2017;14:587-595. Electronically Signed   By: Simonne Come M.D.   On: 01/12/2019 07:48    Lab Data:  CBC: Recent Labs  Lab 01/10/19 1249 01/10/19 1525 01/11/19 1536  WBC 12.7*  --  21.6*  HGB 16.0 17.7* 15.6  HCT 51.8 52.0 50.7  MCV 95.9  --  95.7  PLT 218  --  275   Basic Metabolic Panel: Recent Labs  Lab 01/10/19 1249 01/10/19 1525 01/11/19 1536  NA 138 137 138  K 5.0 4.9 4.8  CL 98  --  99  CO2 29  --  31  GLUCOSE 122*  --  187*  BUN 14  --  26*  CREATININE 0.68  --  0.80  CALCIUM 9.6  --  9.3   GFR: Estimated Creatinine Clearance: 144.2 mL/min (by C-G formula based on SCr of 0.8 mg/dL). Liver Function Tests: No results for input(s): AST, ALT, ALKPHOS, BILITOT, PROT, ALBUMIN in the last 168 hours. No results for input(s): LIPASE, AMYLASE in the last 168 hours. No results for input(s): AMMONIA in the last 168 hours. Coagulation Profile: No results for input(s): INR, PROTIME in the last 168 hours. Cardiac Enzymes: No results for input(s): CKTOTAL, CKMB, CKMBINDEX, TROPONINI in the last  168 hours. BNP (last 3 results) No results for input(s): PROBNP in the last 8760 hours. HbA1C: No results for input(s): HGBA1C in the last 72 hours. CBG: No results for input(s): GLUCAP in the last 168 hours. Lipid Profile: No results for input(s): CHOL, HDL, LDLCALC, TRIG, CHOLHDL, LDLDIRECT in  the last 72 hours. Thyroid Function Tests: Recent Labs    01/11/19 1851  TSH 0.121*  FREET4 0.82   Anemia Panel: No results for input(s): VITAMINB12, FOLATE, FERRITIN, TIBC, IRON, RETICCTPCT in the last 72 hours. Urine analysis: No results found for: COLORURINE, APPEARANCEUR, LABSPEC, PHURINE, GLUCOSEU, HGBUR, BILIRUBINUR, KETONESUR, PROTEINUR, UROBILINOGEN, NITRITE, LEUKOCYTESUR     M.D. [facility] 01/12/2019, 1:28 PM  Pager: [phone] Between 7am to 7pm - call Pager - [phone]  After 7pm go to [email or web address] - password [email or web address]  Call night coverage person covering after 7pm

## 2019-01-12 NOTE — Progress Notes (Signed)
Pt's O2 was dropping into the 60's on 4 LPM while he was sleeping. RT requested we get order for CPAP.

## 2019-01-12 NOTE — Progress Notes (Signed)
RT went into patient room to give pt his breathing treatment. Patient was having between 10 to 20 seconds of apnea episodes. Patient was also restless. RT asked nurse to get an order for a cpap . Patient is on 5-20 cmH2O with 8 liter bleed in. Pt sats are between 93% to 95%  With a full face mask due to  Pt being a mouth breather. RT will continue to monitor.

## 2019-01-13 ENCOUNTER — Inpatient Hospital Stay (HOSPITAL_COMMUNITY): Payer: Self-pay

## 2019-01-13 DIAGNOSIS — R0602 Shortness of breath: Secondary | ICD-10-CM

## 2019-01-13 LAB — CBC
HCT: 53.5 % — ABNORMAL HIGH (ref 39.0–52.0)
Hemoglobin: 16 g/dL (ref 13.0–17.0)
MCH: 29.4 pg (ref 26.0–34.0)
MCHC: 29.9 g/dL — ABNORMAL LOW (ref 30.0–36.0)
MCV: 98.2 fL (ref 80.0–100.0)
Platelets: 252 10*3/uL (ref 150–400)
RBC: 5.45 MIL/uL (ref 4.22–5.81)
RDW: 13.7 % (ref 11.5–15.5)
WBC: 23.8 10*3/uL — AB (ref 4.0–10.5)
nRBC: 0 % (ref 0.0–0.2)

## 2019-01-13 LAB — BASIC METABOLIC PANEL
Anion gap: 9 (ref 5–15)
BUN: 26 mg/dL — ABNORMAL HIGH (ref 6–20)
CHLORIDE: 100 mmol/L (ref 98–111)
CO2: 28 mmol/L (ref 22–32)
CREATININE: 0.56 mg/dL — AB (ref 0.61–1.24)
Calcium: 8.8 mg/dL — ABNORMAL LOW (ref 8.9–10.3)
GFR calc Af Amer: >60 mL/min (ref >60)
GFR calc non Af Amer: >60 mL/min (ref >60)
Glucose, Bld: 150 mg/dL — ABNORMAL HIGH (ref 70–99)
Potassium: 4.2 mmol/L (ref 3.5–5.1)
Sodium: 137 mmol/L (ref 135–145)

## 2019-01-13 LAB — ECHOCARDIOGRAM COMPLETE
Height: 74 in
Weight: 4480 oz

## 2019-01-13 MED ORDER — BUDESONIDE 0.5 MG/2ML IN SUSP
0.5000 mg | Freq: Two times a day (BID) | RESPIRATORY_TRACT | Status: DC
  Administered 2019-01-13 – 2019-01-15 (×5): 0.5 mg via RESPIRATORY_TRACT
  Filled 2019-01-13 (×5): qty 2

## 2019-01-13 NOTE — Progress Notes (Signed)
  Echocardiogram 2D Echocardiogram has been performed.  Steven Daniels L Androw 01/13/2019, 12:22 PM

## 2019-01-13 NOTE — Progress Notes (Signed)
[facility]                                                                              Patient Demographics  Steven Daniels, is a 58 y.o. male, DOB - 1961-07-23, GNF:[id number]  Admit date - 01/11/2019   Admitting Physician Zyion Doxtater Jenna Luo, MD  Outpatient Primary MD for the patient is [facility]  Outpatient specialists:   LOS - 2  days   Medical records reviewed and are as summarized below:    Chief Complaint  Patient presents with  . Shortness of Breath       Brief summary   Patient is a 58 year old male with history of alcohol abuse currently undergoing detox at [facility], bipolar disorder, depression, nicotine abuse, polysubstance abuse presented to ED with shortness of breath, diffuse wheezing for last 3 weeks.  Per patient he had flu 1 month ago and since then he has bronchitis, pleuritic chest pain, coughing productive with mucus and fatigue.  Patient was seen by his PCP, started on antibiotics, he took the entire course of antibiotics however symptoms reoccurred in the last week. He was seen in ED on 2/12, he was hypoxic on ambulating, patient was offered admission which he declined.  Patient was discharged home with prednisone taper and doxycycline. Patient returned back to ED via EMS from [facility] with shortness of breath, per EMS his O2 sats were 86% on room air and was placed on nonrebreather, 78% on room air on exam  Has been a heavy smoker 1 pack/day for almost 30 years but states he is quitting.   Assessment & Plan    Principal Problem:   Acute respiratory failure with hypoxia (HCC) with acute bronchitis, likely has underlying COPD with acute exacerbation -Wheezing is improving however still hypoxic overnight needed 6 L O2, currently sats 93% on 4 L, prior to admission was not on O2.   - will need outpatient PFTs and sleep study -CTA chest negative for PE, flu panel negative, chest x-ray with no  infiltrates -Continue scheduled nebs, IV Solu-Medrol, flutter valve, Pulmicort, antitussives -Added Zithromax, continue IV Rocephin -Obtain 2D echocardiogram, rule out underlying pulmonary hypertension/cor pulmonale or CHF -Home O2 evaluation at the time of discharge.  Active Problems:   Cigarette smoker -Patient counseled on smoking cessation, continue nicotine patch  Incidental finding of thyroid mass/multinodular goiter -TSH low 0.12 however free T4 0.8, T3 90 -Thyroid ultrasound suggestive of multinodular goiter, recommended percutaneous sampling.  Discussed in detail with the patient, outpatient further work-up with PCP  History of alcohol abuse -Currently in [facility] for alcohol rehab  Abnormal EKG -Troponins negative, EKG with no acute ST-T wave changes  History of polysubstance abuse -Urine tox screen positive for benzodiazepines  Tiny pulmonary nodule seen on CT -Outpatient follow-up in 12 months  underlying OSA/nocturnal hypoxia -Outpatient PFTs and sleep study  Code Status: Full CODE STATUS DVT Prophylaxis: Lovenox Family Communication: Discussed in detail with the patient, all imaging results, lab results explained to the patient    Disposition Plan: Still dyspneic and hypoxic, wheezing, will need another 24 to 48 hours   Time  Spent in minutes 25 minutes  Procedures:  None  Consultants:   None  Antimicrobials:   Anti-infectives (From admission, onward)   Start     Dose/Rate Route Frequency Ordered Stop   01/12/19 1000  azithromycin (ZITHROMAX) tablet 500 mg     500 mg Oral Daily 01/12/19 0908     01/11/19 2000  cefTRIAXone (ROCEPHIN) 1 g in sodium chloride 0.9 % 100 mL IVPB     1 g 200 mL/hr over 30 Minutes Intravenous Every 24 hours 01/11/19 1821 01/16/19 1959         Medications  Scheduled Meds: . azithromycin  500 mg Oral Daily  . benzonatate  200 mg Oral TID  . budesonide (PULMICORT) nebulizer solution  0.5 mg Nebulization  BID  . enoxaparin (LOVENOX) injection  40 mg Subcutaneous Q24H  . gabapentin  300 mg Oral TID  . guaiFENesin  600 mg Oral BID  . ipratropium-albuterol  3 mL Nebulization Q4H  . methylPREDNISolone (SOLU-MEDROL) injection  60 mg Intravenous Q6H  . nicotine  21 mg Transdermal Daily  . propranolol  40 mg Oral BID  . thiamine  100 mg Oral Daily   Continuous Infusions: . cefTRIAXone (ROCEPHIN)  IV 1 g (01/12/19 2022)   PRN Meds:.albuterol, traZODone      Subjective:   Simone CuriaClay Vandenberg was seen and examined today.  Still hypoxic 93% on 4 L, overnight required 6 L.  Wheezing although feels wheezing is improving.  No chest pain.   Patient denies dizziness, abdominal pain, N/V/D/C, new weakness, numbess, tingling.   Objective:   Vitals:   01/13/19 0024 01/13/19 0329 01/13/19 0501 01/13/19 0823  BP:   (!) 150/75   Pulse:   64   Resp:   16   Temp:   97.9 F (36.6 C)   TempSrc:   Oral   SpO2: 90% 96% 97% 93%  Weight:      Height:        Intake/Output Summary (Last 24 hours) at 01/13/2019 1143 Last data filed at 01/13/2019 0502 Gross per 24 hour  Intake -  Output 550 ml  Net -550 ml     Wt Readings from Last 3 Encounters:  01/11/19 127 kg  01/10/19 127 kg  07/21/18 99.8 kg   Physical Exam  General: Alert and oriented x 3, NAD  Eyes:   HEENT:    Cardiovascular: S1 S2 clear, RRR. No pedal edema b/l  Respiratory: Bilateral expiratory wheezing  Gastrointestinal: Soft, nontender, nondistended, NBS  Ext: no pedal edema bilaterally  Neuro: no new deficits  Musculoskeletal: No cyanosis, clubbing  Skin: No rashes  Psych: Normal affect and demeanor, alert and oriented x3    Data Reviewed:  I have personally reviewed following labs and imaging studies  Micro Results Recent Results (from the past 240 hour(s))  Culture, sputum-assessment     Status: None   Collection Time: 01/11/19  8:10 PM  Result Value Ref Range Status   Specimen Description SPUTUM  Final    Special Requests NONE  Final   Sputum evaluation   Final    THIS SPECIMEN IS ACCEPTABLE FOR SPUTUM CULTURE Performed at [facility], [address] [place], [place] [address]    Report Status 01/11/2019 FINAL  Final  Culture, respiratory     Status: None (Preliminary result)   Collection Time: 01/11/19  8:10 PM  Result Value Ref Range Status   Specimen Description   Final    SPUTUM Performed at [facility], [address], [place], [place] [address]    Special Requests   Final    NONE Reflexed from [id number] Performed at [facility], [address], [place], [place] [address]    Gram Stain   Final    MODERATE WBC PRESENT, PREDOMINANTLY PMN ABUNDANT GRAM POSITIVE COCCI RARE YEAST    Culture   Final    MODERATE Consistent with normal respiratory flora. Performed at [facility], [address] [place], [place] [address]    Report Status PENDING  Incomplete    Radiology Reports Dg Chest 2 View  Result Date: 01/11/2019 CLINICAL DATA:  Cough, shortness of breath. EXAM: CHEST - 2 VIEW COMPARISON:  Radiographs of January 10, 2019. FINDINGS: Stable cardiomegaly. No pneumothorax or pleural effusion is noted. No acute pulmonary disease is noted. Bony thorax is unremarkable. IMPRESSION: No active cardiopulmonary disease. Electronically Signed   By: Lupita Raider, M.D.   On: 01/11/2019 15:51   Dg Chest 2 View  Result Date: 01/10/2019 CLINICAL DATA:  Worsening cough. EXAM: CHEST - 2 VIEW COMPARISON:  Chest x-ray 12/20/2017 and CT chest, same date. FINDINGS: The heart is mildly enlarged but stable. Stable tortuosity of the thoracic aorta. 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 Course interstitial markings and peribronchial thickening suggesting bronchitis. No definite infiltrates or effusions. IMPRESSION: 1. Stable cardiac enlargement. 2. Findings suggest bronchitis.  No infiltrates or effusions. Electronically Signed    By: Rudie Meyer M.D.   On: 01/10/2019 13:46   Ct Angio Chest Pe W/cm &/or Wo Cm  Result Date: 01/10/2019 CLINICAL DATA:  Chest pain, rule out PE EXAM: CT ANGIOGRAPHY CHEST WITH CONTRAST TECHNIQUE: Multidetector CT imaging of the chest was performed using the standard protocol during bolus administration of intravenous contrast. Multiplanar CT image reconstructions and MIPs were obtained to evaluate the vascular anatomy. CONTRAST:  <See Chart> ISOVUE-370 IOPAMIDOL (ISOVUE-370) INJECTION 76% COMPARISON:  12/20/2018 FINDINGS: Cardiovascular: Satisfactory opacification of the pulmonary arteries to the segmental level. No evidence of pulmonary embolism. Scattered coronary artery calcifications. Cardiomegaly. No pericardial effusion. Mediastinum/Nodes: No enlarged mediastinal, hilar, or axillary lymph nodes. Redemonstrated large exophytic nodule of the inferior pole of the right lobe of the thyroid measuring at least 6.3 cm extending into the superior mediastinum. Trachea and esophagus demonstrate no significant findings. Lungs/Pleura: Bibasilar scarring or atelectasis. Occasional tiny pulmonary nodules as seen on prior examination, for example a 2 mm nodule of the right upper lobe (series 7, image 29). No pleural effusion or pneumothorax. Upper Abdomen: No acute abnormality. Musculoskeletal: No chest wall abnormality. No acute or significant osseous findings. Review of the MIP images confirms the above findings. IMPRESSION: 1.  Negative examination for pulmonary embolism. 2.  Cardiomegaly. 3. Redemonstrated large exophytic nodule of the inferior pole of the right lobe of the thyroid measuring at least 6.3 cm extending into the superior mediastinum. As on prior examination, recommend nonemergent ultrasound examination of the thyroid to further characterize. 4. Occasional tiny pulmonary nodules as seen on prior CT. As on prior, CT follow-up is optional for these nodules at 12 months if high risk factors for lung  cancer are present. Electronically Signed   By: Lauralyn Primes M.D.   On: 01/10/2019 16:52   US Thyroid  Result Date: 01/12/2019 CLINICAL DATA:  Incidental on CT. Thyroid nodules incidentally noted on chest CT performed 01/10/2019 EXAM: THYROID ULTRASOUND TECHNIQUE: Ultrasound examination of the thyroid gland and adjacent soft tissues was performed. COMPARISON:  Chest CT-01/10/2019 FINDINGS:  Parenchymal Echotexture: Moderately heterogenous Isthmus: Normal in size measures 0.4 cm in diameter Right lobe: Enlarged measuring 9.6 x 4.3 x 3.5 cm Left lobe: Enlarged measuring 5.2 x 2.9 x 2.1 cm. _________________________________________________________ Estimated total number of nodules >/= 1 cm: 3 Number of spongiform nodules >/=  2 cm not described below (TR1): 0 Number of mixed cystic and solid nodules >/= 1.5 cm not described below (TR2): 0 _________________________________________________________ Nodule # 1: Location: Right; Inferior Maximum size: 6.0 cm; Other 2 dimensions: 3.2 x 2.9 cm Composition: solid/almost completely solid (2) Echogenicity: hypoechoic (2) Shape: not taller-than-wide (0) Margins: lobulated/irregular (2) Echogenic foci: none (0) ACR TI-RADS total points: 6. ACR TI-RADS risk category: TR4 (4-6 points). ACR TI-RADS recommendations: **Given size (>/= 1.5 cm) and appearance, fine needle aspiration of this moderately suspicious nodule should be considered based on TI-RADS criteria. _________________________________________________________ Nodule # 2: Location: Right; Mid Maximum size: 4.2 cm; Other 2 dimensions: 2.3 x 2.3 cm Composition: solid/almost completely solid (2) Echogenicity: isoechoic (1) Shape: not taller-than-wide (0) Margins: lobulated/irregular (2) Echogenic foci: macrocalcifications (1) ACR TI-RADS total points: 6. ACR TI-RADS risk category: TR4 (4-6 points). ACR TI-RADS recommendations: **Given size (>/= 1.5 cm) and appearance, fine needle aspiration of this moderately suspicious  nodule should be considered based on TI-RADS criteria. _________________________________________________________ Nodule # 3: Location: Left; Mid Maximum size: 1.8 cm; Other 2 dimensions: 1.5 x 1.4 cm Composition: solid/almost completely solid (2) Echogenicity: hyperechoic (1) Shape: not taller-than-wide (0) Margins: ill-defined (0) Echogenic foci: none (0) ACR TI-RADS total points: 3. ACR TI-RADS risk category: TR3 (3 points). ACR TI-RADS recommendations: *Given size (>/= 1.5 - 2.4 cm) and appearance, a follow-up ultrasound in 1 year should be considered based on TI-RADS criteria. _________________________________________________________ IMPRESSION: 1. Findings suggestive of multinodular goiter. 2. Nodules #1 and #2 both meet imaging criteria to recommend percutaneous sampling as clinically indicated. 3. Nodule #3 meets imaging criteria to recommend a 1 year follow-up. The above is in keeping with the ACR TI-RADS recommendations - J Am Coll Radiol 2017;14:587-595. Electronically Signed   By: Simonne Come M.D.   On: 01/12/2019 07:48    Lab Data:  CBC: Recent Labs  Lab 01/10/19 1249 01/10/19 1525 01/11/19 1536 01/13/19 0533  WBC 12.7*  --  21.6* 23.8*  HGB 16.0 17.7* 15.6 16.0  HCT 51.8 52.0 50.7 53.5*  MCV 95.9  --  95.7 98.2  PLT 218  --  275 252   Basic Metabolic Panel: Recent Labs  Lab 01/10/19 1249 01/10/19 1525 01/11/19 1536 01/13/19 0533  NA 138 137 138 137  K 5.0 4.9 4.8 4.2  CL 98  --  99 100  CO2 29  --  31 28  GLUCOSE 122*  --  187* 150*  BUN 14  --  26* 26*  CREATININE 0.68  --  0.80 0.56*  CALCIUM 9.6  --  9.3 8.8*   GFR: Estimated Creatinine Clearance: 144.2 mL/min (A) (by C-G formula based on SCr of 0.56 mg/dL (L)). Liver Function Tests: No results for input(s): AST, ALT, ALKPHOS, BILITOT, PROT, ALBUMIN in the last 168 hours. No results for input(s): LIPASE, AMYLASE in the last 168 hours. No results for input(s): AMMONIA in the last 168 hours. Coagulation  Profile: No results for input(s): INR, PROTIME in the last 168 hours. Cardiac Enzymes: No results for input(s): CKTOTAL, CKMB, CKMBINDEX, TROPONINI in the last 168 hours. BNP (last 3 results) No results for input(s): PROBNP in the last 8760 hours. HbA1C: No results for input(s): HGBA1C in  the last 72 hours. CBG: No results for input(s): GLUCAP in the last 168 hours. Lipid Profile: No results for input(s): CHOL, HDL, LDLCALC, TRIG, CHOLHDL, LDLDIRECT in the last 72 hours. Thyroid Function Tests: Recent Labs    01/11/19 1851  TSH 0.121*  FREET4 0.82   Anemia Panel: No results for input(s): VITAMINB12, FOLATE, FERRITIN, TIBC, IRON, RETICCTPCT in the last 72 hours. Urine analysis: No results found for: COLORURINE, APPEARANCEUR, LABSPEC, PHURINE, GLUCOSEU, HGBUR, BILIRUBINUR, KETONESUR, PROTEINUR, UROBILINOGEN, NITRITE, LEUKOCYTESUR   Purvis Sidle M.D. [facility] 01/13/2019, 11:43 AM  Pager: [phone] Between 7am to 7pm - call Pager - [phone]  After 7pm go to [email or web address] - password [email or web address]  Call night coverage person covering after 7pm

## 2019-01-13 NOTE — Progress Notes (Signed)
Patient wanting to ambulate in hallway without oxygen, O2 sats found to range between 83-86% with ambulation off oxygen.  Patient maintains low to mid 90's at rest on 4 liters O2.

## 2019-01-14 LAB — CBC
HCT: 51.2 % (ref 39.0–52.0)
Hemoglobin: 15.4 g/dL (ref 13.0–17.0)
MCH: 30.1 pg (ref 26.0–34.0)
MCHC: 30.1 g/dL (ref 30.0–36.0)
MCV: 100 fL (ref 80.0–100.0)
Platelets: 251 10*3/uL (ref 150–400)
RBC: 5.12 MIL/uL (ref 4.22–5.81)
RDW: 13.7 % (ref 11.5–15.5)
WBC: 20 10*3/uL — ABNORMAL HIGH (ref 4.0–10.5)
nRBC: 0 % (ref 0.0–0.2)

## 2019-01-14 LAB — BASIC METABOLIC PANEL
Anion gap: 7 (ref 5–15)
BUN: 25 mg/dL — ABNORMAL HIGH (ref 6–20)
CALCIUM: 8.5 mg/dL — AB (ref 8.9–10.3)
CO2: 32 mmol/L (ref 22–32)
Chloride: 100 mmol/L (ref 98–111)
Creatinine, Ser: 0.47 mg/dL — ABNORMAL LOW (ref 0.61–1.24)
GFR calc Af Amer: >60 mL/min (ref >60)
GFR calc non Af Amer: >60 mL/min (ref >60)
Glucose, Bld: 166 mg/dL — ABNORMAL HIGH (ref 70–99)
Potassium: 3.6 mmol/L (ref 3.5–5.1)
Sodium: 139 mmol/L (ref 135–145)

## 2019-01-14 LAB — CULTURE, RESPIRATORY W GRAM STAIN: Culture: NORMAL

## 2019-01-14 LAB — CULTURE, RESPIRATORY

## 2019-01-14 MED ORDER — IPRATROPIUM-ALBUTEROL 0.5-2.5 (3) MG/3ML IN SOLN
3.0000 mL | Freq: Four times a day (QID) | RESPIRATORY_TRACT | Status: DC
  Administered 2019-01-14 – 2019-01-15 (×6): 3 mL via RESPIRATORY_TRACT
  Filled 2019-01-14 (×6): qty 3

## 2019-01-14 NOTE — Progress Notes (Signed)
Pt pulled off CPAP and called RN to room stating, "I'm miserable with that on and can't wear it." Pt educated about the importance of wearing his CPAP. Pt verbalized understanding and refused. O2 stable with [place] at this time. Will continue to monitor.

## 2019-01-14 NOTE — Progress Notes (Signed)
[facility]                                                                              Patient Demographics  Steven Daniels, is a 58 y.o. male, DOB - 1961-02-01, TZG:[id number]  Admit date - 01/11/2019   Admitting Physician Ripudeep Jenna Luo, MD  Outpatient Primary MD for the patient is [facility]  Outpatient specialists:   LOS - 3  days   Medical records reviewed and are as summarized below:    Chief Complaint  Patient presents with  . Shortness of Breath       Brief summary   Patient is a 58 year old male with history of alcohol abuse currently undergoing detox at [facility], bipolar disorder, depression, nicotine abuse, polysubstance abuse presented to ED with shortness of breath, diffuse wheezing for last 3 weeks.  Per patient he had flu 1 month ago and since then he has bronchitis, pleuritic chest pain, coughing productive with mucus and fatigue.  Patient was seen by his PCP, started on antibiotics, he took the entire course of antibiotics however symptoms reoccurred in the last week. He was seen in ED on 2/12, he was hypoxic on ambulating, patient was offered admission which he declined.  Patient was discharged home with prednisone taper and doxycycline. Patient returned back to ED via EMS from [facility] with shortness of breath, per EMS his O2 sats were 86% on room air and was placed on nonrebreather, 78% on room air on exam  Has been a heavy smoker 1 pack/day for almost 30 years but states he is quitting.   Assessment & Plan    Principal Problem:   Acute respiratory failure with hypoxia (HCC) with acute bronchitis, likely has underlying COPD with acute exacerbation -Wheezing is improving however still on 4 L O2, sats 97%.  Noncompliant with CPAP overnight. - will need outpatient PFTs and sleep study -CTA chest negative for PE, flu panel negative, chest x-ray with no infiltrates -Continue IV scheduled nebs, IV  Solu-Medrol, will transition to prednisone in a.m.  -Continue Pulmicort, antitussives, flutter valve  -Continue IV Rocephin and Zithromax -2D echo showed EF of 60 to 65%, no regional wall motion abnormalities -Home O2 evaluation at the time of discharge.  Active Problems:   Cigarette smoker -Patient counseled on smoking cessation, continue nicotine patch  Incidental finding of thyroid mass/multinodular goiter -TSH low 0.12 however free T4 0.8, T3 90 -Thyroid ultrasound suggestive of multinodular goiter, recommended percutaneous sampling.  Discussed in detail with the patient, outpatient further work-up with PCP  History of alcohol abuse -Currently in [facility] for alcohol rehab  Abnormal EKG -Troponins negative, EKG with no acute ST-T wave changes  History of polysubstance abuse -Urine tox screen positive for benzodiazepines  Tiny pulmonary nodule seen on CT -Outpatient follow-up in 12 months  underlying OSA/nocturnal hypoxia -Outpatient PFTs and sleep study  Code Status: Full CODE STATUS DVT Prophylaxis: Lovenox Family Communication: Discussed in detail with the patient, all imaging results, lab results explained to the patient    Disposition Plan: Wheezing is improving, hypoxia still persisting but improving, hopefully DC next 24 to  48 hours   Time Spent in minutes 25 minutes  Procedures:  None  Consultants:   None  Antimicrobials:   Anti-infectives (From admission, onward)   Start     Dose/Rate Route Frequency Ordered Stop   01/12/19 1000  azithromycin (ZITHROMAX) tablet 500 mg     500 mg Oral Daily 01/12/19 0908     01/11/19 2000  cefTRIAXone (ROCEPHIN) 1 g in sodium chloride 0.9 % 100 mL IVPB     1 g 200 mL/hr over 30 Minutes Intravenous Every 24 hours 01/11/19 1821 01/16/19 1959         Medications  Scheduled Meds: . azithromycin  500 mg Oral Daily  . benzonatate  200 mg Oral TID  . budesonide (PULMICORT) nebulizer solution  0.5 mg  Nebulization BID  . enoxaparin (LOVENOX) injection  40 mg Subcutaneous Q24H  . gabapentin  300 mg Oral TID  . guaiFENesin  600 mg Oral BID  . ipratropium-albuterol  3 mL Nebulization Q6H  . methylPREDNISolone (SOLU-MEDROL) injection  60 mg Intravenous Q6H  . nicotine  21 mg Transdermal Daily  . propranolol  40 mg Oral BID  . thiamine  100 mg Oral Daily   Continuous Infusions: . cefTRIAXone (ROCEPHIN)  IV 1 g (01/13/19 1949)   PRN Meds:.albuterol, traZODone      Subjective:   Steven Daniels was seen and examined today.  Per patient wheezing is a lot better.  Still on 4 L O2.  No chest pain, fevers.   Patient denies dizziness, abdominal pain, N/V/D/C, new weakness, numbess, tingling.   Objective:   Vitals:   01/14/19 0521 01/14/19 0923 01/14/19 0943 01/14/19 0948  BP: (!) 150/90     Pulse: 69 71    Resp: 16     Temp: 98 F (36.7 C)     TempSrc: Oral     SpO2: 94%  97% 97%  Weight:      Height:        Intake/Output Summary (Last 24 hours) at 01/14/2019 1121 Last data filed at 01/14/2019 0930 Gross per 24 hour  Intake 240 ml  Output 250 ml  Net -10 ml     Wt Readings from Last 3 Encounters:  01/11/19 127 kg  01/10/19 127 kg  07/21/18 99.8 kg   Physical Exam  General: Alert and oriented x 3, NAD, flushed face   Eyes:   HEENT:  Atraumatic, normocephalic  Cardiovascular: S1 S2 clear, no murmurs, RRR. No pedal edema b/l  Respiratory: Bilateral expiratory wheezing improving  Gastrointestinal: Soft, nontender, nondistended, NBS  Ext: no pedal edema bilaterally  Neuro: no new deficits  Musculoskeletal: No cyanosis, clubbing  Skin: No rashes  Psych: Normal affect and demeanor, alert and oriented x3      Data Reviewed:  I have personally reviewed following labs and imaging studies  Micro Results Recent Results (from the past 240 hour(s))  Culture, sputum-assessment     Status: None   Collection Time: 01/11/19  8:10 PM  Result Value Ref Range  Status   Specimen Description SPUTUM  Final   Special Requests NONE  Final   Sputum evaluation   Final    THIS SPECIMEN IS ACCEPTABLE FOR SPUTUM CULTURE Performed at [facility], [address] [place], [place] [address]    Report Status 01/11/2019 FINAL  Final  Culture, respiratory     Status: None   Collection Time: 01/11/19  8:10 PM  Result Value Ref Range Status   Specimen Description  Final    SPUTUM Performed at [facility], [address], [place], [place] [address]    Special Requests   Final    NONE Reflexed from [id number] Performed at [facility], [address] [place], [place] [address]    Gram Stain   Final    MODERATE WBC PRESENT, PREDOMINANTLY PMN ABUNDANT GRAM POSITIVE COCCI RARE YEAST    Culture   Final    MODERATE Consistent with normal respiratory flora. Performed at [facility], [address] [place], [place] [address]    Report Status 01/14/2019 FINAL  Final    Radiology Reports Dg Chest 2 View  Result Date: 01/11/2019 CLINICAL DATA:  Cough, shortness of breath. EXAM: CHEST - 2 VIEW COMPARISON:  Radiographs of January 10, 2019. FINDINGS: Stable cardiomegaly. No pneumothorax or pleural effusion is noted. No acute pulmonary disease is noted. Bony thorax is unremarkable. IMPRESSION: No active cardiopulmonary disease. Electronically Signed   By: Lupita Raider, M.D.   On: 01/11/2019 15:51   Dg Chest 2 View  Result Date: 01/10/2019 CLINICAL DATA:  Worsening cough. EXAM: CHEST - 2 VIEW COMPARISON:  Chest x-ray 12/20/2017 and CT chest, same date. FINDINGS: The heart is mildly enlarged but stable. Stable tortuosity of the thoracic aorta. 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 Course interstitial markings and peribronchial thickening suggesting bronchitis. No definite infiltrates or effusions. IMPRESSION: 1. Stable cardiac enlargement. 2. Findings suggest bronchitis.  No infiltrates or  effusions. Electronically Signed   By: Rudie Meyer M.D.   On: 01/10/2019 13:46   Ct Angio Chest Pe W/cm &/or Wo Cm  Result Date: 01/10/2019 CLINICAL DATA:  Chest pain, rule out PE EXAM: CT ANGIOGRAPHY CHEST WITH CONTRAST TECHNIQUE: Multidetector CT imaging of the chest was performed using the standard protocol during bolus administration of intravenous contrast. Multiplanar CT image reconstructions and MIPs were obtained to evaluate the vascular anatomy. CONTRAST:  <See Chart> ISOVUE-370 IOPAMIDOL (ISOVUE-370) INJECTION 76% COMPARISON:  12/20/2018 FINDINGS: Cardiovascular: Satisfactory opacification of the pulmonary arteries to the segmental level. No evidence of pulmonary embolism. Scattered coronary artery calcifications. Cardiomegaly. No pericardial effusion. Mediastinum/Nodes: No enlarged mediastinal, hilar, or axillary lymph nodes. Redemonstrated large exophytic nodule of the inferior pole of the right lobe of the thyroid measuring at least 6.3 cm extending into the superior mediastinum. Trachea and esophagus demonstrate no significant findings. Lungs/Pleura: Bibasilar scarring or atelectasis. Occasional tiny pulmonary nodules as seen on prior examination, for example a 2 mm nodule of the right upper lobe (series 7, image 29). No pleural effusion or pneumothorax. Upper Abdomen: No acute abnormality. Musculoskeletal: No chest wall abnormality. No acute or significant osseous findings. Review of the MIP images confirms the above findings. IMPRESSION: 1.  Negative examination for pulmonary embolism. 2.  Cardiomegaly. 3. Redemonstrated large exophytic nodule of the inferior pole of the right lobe of the thyroid measuring at least 6.3 cm extending into the superior mediastinum. As on prior examination, recommend nonemergent ultrasound examination of the thyroid to further characterize. 4. Occasional tiny pulmonary nodules as seen on prior CT. As on prior, CT follow-up is optional for these nodules at 12 months  if high risk factors for lung cancer are present. Electronically Signed   By: Lauralyn Primes M.D.   On: 01/10/2019 16:52   US Thyroid  Result Date: 01/12/2019 CLINICAL DATA:  Incidental on CT. Thyroid nodules incidentally noted on chest CT performed 01/10/2019 EXAM: THYROID ULTRASOUND TECHNIQUE: Ultrasound examination of the thyroid gland and  adjacent soft tissues was performed. COMPARISON:  Chest CT-01/10/2019 FINDINGS: Parenchymal Echotexture: Moderately heterogenous Isthmus: Normal in size measures 0.4 cm in diameter Right lobe: Enlarged measuring 9.6 x 4.3 x 3.5 cm Left lobe: Enlarged measuring 5.2 x 2.9 x 2.1 cm. _________________________________________________________ Estimated total number of nodules >/= 1 cm: 3 Number of spongiform nodules >/=  2 cm not described below (TR1): 0 Number of mixed cystic and solid nodules >/= 1.5 cm not described below (TR2): 0 _________________________________________________________ Nodule # 1: Location: Right; Inferior Maximum size: 6.0 cm; Other 2 dimensions: 3.2 x 2.9 cm Composition: solid/almost completely solid (2) Echogenicity: hypoechoic (2) Shape: not taller-than-wide (0) Margins: lobulated/irregular (2) Echogenic foci: none (0) ACR TI-RADS total points: 6. ACR TI-RADS risk category: TR4 (4-6 points). ACR TI-RADS recommendations: **Given size (>/= 1.5 cm) and appearance, fine needle aspiration of this moderately suspicious nodule should be considered based on TI-RADS criteria. _________________________________________________________ Nodule # 2: Location: Right; Mid Maximum size: 4.2 cm; Other 2 dimensions: 2.3 x 2.3 cm Composition: solid/almost completely solid (2) Echogenicity: isoechoic (1) Shape: not taller-than-wide (0) Margins: lobulated/irregular (2) Echogenic foci: macrocalcifications (1) ACR TI-RADS total points: 6. ACR TI-RADS risk category: TR4 (4-6 points). ACR TI-RADS recommendations: **Given size (>/= 1.5 cm) and appearance, fine needle aspiration of  this moderately suspicious nodule should be considered based on TI-RADS criteria. _________________________________________________________ Nodule # 3: Location: Left; Mid Maximum size: 1.8 cm; Other 2 dimensions: 1.5 x 1.4 cm Composition: solid/almost completely solid (2) Echogenicity: hyperechoic (1) Shape: not taller-than-wide (0) Margins: ill-defined (0) Echogenic foci: none (0) ACR TI-RADS total points: 3. ACR TI-RADS risk category: TR3 (3 points). ACR TI-RADS recommendations: *Given size (>/= 1.5 - 2.4 cm) and appearance, a follow-up ultrasound in 1 year should be considered based on TI-RADS criteria. _________________________________________________________ IMPRESSION: 1. Findings suggestive of multinodular goiter. 2. Nodules #1 and #2 both meet imaging criteria to recommend percutaneous sampling as clinically indicated. 3. Nodule #3 meets imaging criteria to recommend a 1 year follow-up. The above is in keeping with the ACR TI-RADS recommendations - J Am Coll Radiol 2017;14:587-595. Electronically Signed   By: Simonne ComeJohn  Watts M.D.   On: 01/12/2019 07:48    Lab Data:  CBC: Recent Labs  Lab 01/10/19 1249 01/10/19 1525 01/11/19 1536 01/13/19 0533 01/14/19 0612  WBC 12.7*  --  21.6* 23.8* 20.0*  HGB 16.0 17.7* 15.6 16.0 15.4  HCT 51.8 52.0 50.7 53.5* 51.2  MCV 95.9  --  95.7 98.2 100.0  PLT 218  --  275 252 251   Basic Metabolic Panel: Recent Labs  Lab 01/10/19 1249 01/10/19 1525 01/11/19 1536 01/13/19 0533 01/14/19 0612  NA 138 137 138 137 139  K 5.0 4.9 4.8 4.2 3.6  CL 98  --  99 100 100  CO2 29  --  31 28 32  GLUCOSE 122*  --  187* 150* 166*  BUN 14  --  26* 26* 25*  CREATININE 0.68  --  0.80 0.56* 0.47*  CALCIUM 9.6  --  9.3 8.8* 8.5*   GFR: Estimated Creatinine Clearance: 144.2 mL/min (A) (by C-G formula based on SCr of 0.47 mg/dL (L)). Liver Function Tests: No results for input(s): AST, ALT, ALKPHOS, BILITOT, PROT, ALBUMIN in the last 168 hours. No results for input(s):  LIPASE, AMYLASE in the last 168 hours. No results for input(s): AMMONIA in the last 168 hours. Coagulation Profile: No results for input(s): INR, PROTIME in the last 168 hours. Cardiac Enzymes: No results for input(s): CKTOTAL, CKMB, CKMBINDEX,  TROPONINI in the last 168 hours. BNP (last 3 results) No results for input(s): PROBNP in the last 8760 hours. HbA1C: No results for input(s): HGBA1C in the last 72 hours. CBG: No results for input(s): GLUCAP in the last 168 hours. Lipid Profile: No results for input(s): CHOL, HDL, LDLCALC, TRIG, CHOLHDL, LDLDIRECT in the last 72 hours. Thyroid Function Tests: Recent Labs    01/11/19 1851  TSH 0.121*  FREET4 0.82   Anemia Panel: No results for input(s): VITAMINB12, FOLATE, FERRITIN, TIBC, IRON, RETICCTPCT in the last 72 hours. Urine analysis: No results found for: COLORURINE, APPEARANCEUR, LABSPEC, PHURINE, GLUCOSEU, HGBUR, BILIRUBINUR, KETONESUR, PROTEINUR, UROBILINOGEN, NITRITE, LEUKOCYTESUR   Ripudeep Rai M.D. [facility] 01/14/2019, 11:21 AM  Pager: [phone] Between 7am to 7pm - call Pager - [phone]  After 7pm go to [email or web address] - password [email or web address]  Call night coverage person covering after 7pm

## 2019-01-15 LAB — CBC
HEMATOCRIT: 51.5 % (ref 39.0–52.0)
HEMOGLOBIN: 15.6 g/dL (ref 13.0–17.0)
MCH: 30.2 pg (ref 26.0–34.0)
MCHC: 30.3 g/dL (ref 30.0–36.0)
MCV: 99.6 fL (ref 80.0–100.0)
Platelets: 241 10*3/uL (ref 150–400)
RBC: 5.17 MIL/uL (ref 4.22–5.81)
RDW: 13.5 % (ref 11.5–15.5)
WBC: 18.6 10*3/uL — ABNORMAL HIGH (ref 4.0–10.5)
nRBC: 0 % (ref 0.0–0.2)

## 2019-01-15 LAB — BASIC METABOLIC PANEL
Anion gap: 6 (ref 5–15)
BUN: 22 mg/dL — ABNORMAL HIGH (ref 6–20)
CHLORIDE: 99 mmol/L (ref 98–111)
CO2: 33 mmol/L — ABNORMAL HIGH (ref 22–32)
Calcium: 8.3 mg/dL — ABNORMAL LOW (ref 8.9–10.3)
Creatinine, Ser: 0.57 mg/dL — ABNORMAL LOW (ref 0.61–1.24)
GFR calc Af Amer: >60 mL/min (ref >60)
GFR calc non Af Amer: >60 mL/min (ref >60)
Glucose, Bld: 168 mg/dL — ABNORMAL HIGH (ref 70–99)
POTASSIUM: 4 mmol/L (ref 3.5–5.1)
Sodium: 138 mmol/L (ref 135–145)

## 2019-01-15 MED ORDER — NICOTINE 21 MG/24HR TD PT24: 21.0000 mg | MEDICATED_PATCH | Freq: Every day | TRANSDERMAL | 0 refills | Status: DC

## 2019-01-15 MED ORDER — AZITHROMYCIN 500 MG PO TABS: 500.0000 mg | ORAL_TABLET | Freq: Every day | ORAL | 0 refills | Status: AC

## 2019-01-15 MED ORDER — ALBUTEROL SULFATE (2.5 MG/3ML) 0.083% IN NEBU: 2.5000 mg | INHALATION_SOLUTION | Freq: Three times a day (TID) | RESPIRATORY_TRACT | 12 refills | Status: DC

## 2019-01-15 MED ORDER — PREDNISONE 10 MG PO TABS: ORAL_TABLET | ORAL | 0 refills | Status: DC

## 2019-01-15 MED ORDER — GABAPENTIN 300 MG PO CAPS: 300.0000 mg | ORAL_CAPSULE | Freq: Three times a day (TID) | ORAL | 0 refills | Status: DC

## 2019-01-15 MED ORDER — PREDNISONE 20 MG PO TABS
60.0000 mg | ORAL_TABLET | Freq: Every day | ORAL | Status: DC
  Administered 2019-01-15: 60 mg via ORAL
  Filled 2019-01-15: qty 3

## 2019-01-15 MED ORDER — ALBUTEROL SULFATE HFA 108 (90 BASE) MCG/ACT IN AERS: 2.0000 | INHALATION_SPRAY | Freq: Four times a day (QID) | RESPIRATORY_TRACT | 3 refills | Status: AC | PRN

## 2019-01-15 MED ORDER — BENZONATATE 100 MG PO CAPS: 200.0000 mg | ORAL_CAPSULE | Freq: Three times a day (TID) | ORAL | 0 refills | Status: DC | PRN

## 2019-01-15 MED ORDER — UMECLIDINIUM-VILANTEROL 62.5-25 MCG/INH IN AEPB: 1.0000 | INHALATION_SPRAY | Freq: Every day | RESPIRATORY_TRACT | 3 refills | Status: DC

## 2019-01-15 MED ORDER — GUAIFENESIN ER 600 MG PO TB12: 600.0000 mg | ORAL_TABLET | Freq: Two times a day (BID) | ORAL | 0 refills | Status: DC

## 2019-01-15 MED ORDER — CEFPODOXIME PROXETIL 100 MG PO TABS: 100.0000 mg | ORAL_TABLET | Freq: Two times a day (BID) | ORAL | 0 refills | Status: AC

## 2019-01-15 NOTE — Discharge Summary (Signed)
Physician Discharge Summary   Patient ID: Steven Daniels MRN: [id number] DOB/AGE: [birth year] 57 y.o.  Admit date: 01/11/2019 Discharge date: 01/15/2019  Primary Care Physician:  [facility]   Recommendations for Outpatient Follow-up:  1. Follow up with PCP in 1-2 weeks 2. DME home O2 2 L on ambulation  SATURATION QUALIFICATIONS: (This note is used to comply with regulatory documentation for home oxygen)  Patient Saturations on Room Air at Rest = 94%  Patient Saturations on Room Air while Ambulating = 85%  Patient Saturations on 2 Liters of oxygen while Ambulating = 90-93%  Home Health: DME nebulizer, home O2 Equipment/Devices:   Discharge Condition: stable  CODE STATUS: FULL  Diet recommendation: Regular diet  Discharge Diagnoses:    . Acute respiratory failure with hypoxia (HCC) . Nicotine abuse . COPD with acute exacerbation (HCC) . Multinodular goiter History of alcohol abuse Tiny pulmonary nodule seen on CT     Consults: None    Allergies:   Allergies  Allergen Reactions  . Other Shortness Of Breath    Reaction to cats  . Apple Other (See Comments)    Raw apples cause gum swelling  . Cherry Other (See Comments)    Raw cherries cause gum swelling  . Lisinopril Hives    Possible reaction to lisinopril Possible reaction to lisinopril  . Penicillins Other (See Comments)    Allergic reaction as a 58 year old Has patient had a PCN reaction causing immediate rash, facial/tongue/throat swelling, SOB or lightheadedness with hypotension: no Has patient had a PCN reaction causing severe rash involving mucus membranes or skin necrosis: no Has patient had a PCN reaction that required hospitalization no Has patient had a PCN reaction occurring within the last 10 years: no If all of the above answers are "NO", then may proceed with Cephalosporin use.  Marland Kitchen Plum Pulp Other (See Comments)    Raw plums cause gum swelling     DISCHARGE  MEDICATIONS: Allergies as of 01/15/2019      Reactions   Other Shortness Of Breath   Reaction to cats   Apple Other (See Comments)   Raw apples cause gum swelling   Cherry Other (See Comments)   Raw cherries cause gum swelling   Lisinopril Hives   Possible reaction to lisinopril Possible reaction to lisinopril   Penicillins Other (See Comments)   Allergic reaction as a 58 year old Has patient had a PCN reaction causing immediate rash, facial/tongue/throat swelling, SOB or lightheadedness with hypotension: no Has patient had a PCN reaction causing severe rash involving mucus membranes or skin necrosis: no Has patient had a PCN reaction that required hospitalization no Has patient had a PCN reaction occurring within the last 10 years: no If all of the above answers are "NO", then may proceed with Cephalosporin use.   Plum Pulp Other (See Comments)   Raw plums cause gum swelling      Medication List    STOP taking these medications   Benzocaine 10 MG Lozg   doxycycline 100 MG capsule Commonly known as:  VIBRAMYCIN   ipratropium-albuterol 0.5-2.5 (3) MG/3ML Soln Commonly known as:  DUONEB     TAKE these medications   albuterol 108 (90 Base) MCG/ACT inhaler Commonly known as:  PROVENTIL HFA;VENTOLIN HFA Inhale 2 puffs into the lungs 4 (four) times daily as needed for wheezing or shortness of breath. What changed:  Another medication with the same name was added. Make sure you understand how and when to  take each.   albuterol (2.5 MG/3ML) 0.083% nebulizer solution Commonly known as:  PROVENTIL Take 3 mLs (2.5 mg total) by nebulization 3 (three) times daily. What changed:  You were already taking a medication with the same name, and this prescription was added. Make sure you understand how and when to take each.   azithromycin 500 MG tablet Commonly known as:  ZITHROMAX Take 1 tablet (500 mg total) by mouth daily for 3 days.   benzonatate 100 MG capsule Commonly known as:   TESSALON Take 2 capsules (200 mg total) by mouth 3 (three) times daily as needed for cough.   cefpodoxime 100 MG tablet Commonly known as:  VANTIN Take 1 tablet (100 mg total) by mouth 2 (two) times daily for 3 days.   cetirizine 10 MG tablet Commonly known as:  ZYRTEC Take 10 mg by mouth daily as needed for allergies.   gabapentin 300 MG capsule Commonly known as:  NEURONTIN Take 1 capsule (300 mg total) by mouth 3 (three) times daily.   guaiFENesin 600 MG 12 hr tablet Commonly known as:  MUCINEX Take 1 tablet (600 mg total) by mouth 2 (two) times daily. 9-5 Congestion   ibuprofen 600 MG tablet Commonly known as:  ADVIL,MOTRIN Take 600 mg by mouth every 6 (six) hours as needed for fever or moderate pain.   LAXATIVE 25 MG Tabs Generic drug:  Sennosides Take 1 tablet by mouth daily as needed (constipation).   loperamide 2 MG tablet Commonly known as:  IMODIUM A-D Take 2 mg by mouth 4 (four) times daily as needed for diarrhea or loose stools.   Multi Vitamin Tabs Take 1 tablet by mouth daily at 12 noon. Folic acid   MYLANTA 200-200-20 MG/5ML suspension Generic drug:  alum & mag hydroxide-simeth Take 15-30 mLs by mouth every 6 (six) hours as needed for indigestion or heartburn.   NAUSEA CONTROL 1.87-1.87-21.5 Soln Take 5-10 mLs by mouth daily as needed (nausea).   nicotine 21 mg/24hr patch Commonly known as:  NICODERM CQ - dosed in mg/24 hours Place 1 patch (21 mg total) onto the skin daily.   omega-3 acid ethyl esters 1 g capsule Commonly known as:  LOVAZA Take 3 g by mouth daily.   ondansetron 8 MG tablet Commonly known as:  ZOFRAN Take 8 mg by mouth 2 (two) times daily as needed for nausea or vomiting.   predniSONE 10 MG tablet Commonly known as:  DELTASONE Prednisone dosing: Take  Prednisone 40mg  (4 tabs) x 3 days, then taper to 30mg  (3 tabs) x 3 days, then 20mg  (2 tabs) x 3days, then 10mg  (1 tab) x 3days, then OFF.  Dispense:  30 tabs, refills: None Start  taking on:  January 16, 2019   propranolol 80 MG tablet Commonly known as:  INDERAL Take 1 tablet (80 mg total) by mouth daily. What changed:    how much to take  when to take this   thiamine 100 MG tablet Commonly known as:  VITAMIN B-1 Take 100 mg by mouth daily.   traZODone 50 MG tablet Commonly known as:  DESYREL Take 50 mg by mouth at bedtime as needed for sleep.   umeclidinium-vilanterol 62.5-25 MCG/INH Aepb Commonly known as:  ANORO ELLIPTA Inhale 1 puff into the lungs daily. What changed:  when to take this            Durable Medical Equipment  (From admission, onward)         Start  Ordered   01/15/19 1154  For home use only DME oxygen  Once    Question Answer Comment  Mode or (Route) Nasal cannula   Liters per Minute 2   Frequency Continuous (stationary and portable oxygen unit needed)   Oxygen conserving device Yes   Oxygen delivery system Gas      01/15/19 1153   01/15/19 0855  For home use only DME Nebulizer/meds  Once    Question:  Patient needs a nebulizer to treat with the following condition  Answer:  COPD with acute exacerbation (HCC)   01/15/19 0854           Brief H and P: For complete details please refer to admission H and P, but in brief Patient is a 58 year old male with history of alcohol abuse currently undergoing detox at [facility], bipolar disorder, depression, nicotine abuse, polysubstance abuse presented to ED with shortness of breath, diffuse wheezing for last 3 weeks. Per patient he had flu 1 month ago and since then he has bronchitis, pleuritic chest pain, coughing productive with mucus and fatigue. Patient was seen by his PCP, started on antibiotics, he took the entire course of antibiotics however symptoms reoccurred in the last week. He was seen in ED on 2/12, he was hypoxic on ambulating, patient was offered admission which he declined. Patient was discharged home with prednisone taper and doxycycline. Patient  returned back to ED via EMS [facility] with shortness of breath, per EMS his O2 sats were 86% on room air and was placed on nonrebreather, 78% on room air on exam Has been a heavy smoker 1 pack/day for almost 30 years but states he is quitting.   Hospital Course:  Acute respiratory failure with hypoxia (HCC) secondary to COPD with acute exacerbation -Wheezing has improved -CTA chest negative for PE, flu panel negative, chest x-ray with no infiltrates -Patient was placed on IV Solu-Medrol, transition to oral prednisone  -Patient was placed on scheduled nebs, Pulmicort, antitussive, flutter valve -Transition to albuterol nebs with rescue inhaler as needed, Anoro Ellipta -Initially placed on IV Rocephin and Zithromax, transition to oral Zithromax and Vantin for 3 more days  -2D echo showed EF of 60 to 65%, no regional wall motion abnormalities -Home O2 evaluation done showed patient needs oxygen with ambulation 2 L.  -Appointment scheduled with Dr. Sherene Sires on 01/19/2019.  Patient will need outpatient PFTs and weaning of the O2 as tolerated   Cigarette smoker -Patient counseled on smoking cessation, patient was placed on nicotine patch  Incidental finding of thyroid mass/multinodular goiter -TSH low 0.12 however free T4 0.8, T3 90 -Thyroid ultrasound suggestive of multinodular goiter, recommended percutaneous sampling.  Discussed in detail with the patient, outpatient further work-up with PCP  History of alcohol abuse -Currently in [facility] for alcohol rehab  Abnormal EKG -Troponins negative, EKG with no acute ST-T wave changes  History of polysubstance abuse -Urine tox screen positive for benzodiazepines  Tiny pulmonary nodule seen on CT -Outpatient follow-up in 12 months  underlying OSA/nocturnal hypoxia -Outpatient PFTs and sleep study  Day of Discharge S: Feeling a lot better, wheezing improved  BP 137/87 (BP Location: Left Arm)   Pulse 66   Temp 98  F (36.7 C) (Oral)   Resp (!) 24   Ht 6\' 2"  (1.88 m)   Wt 127 kg   SpO2 96%   BMI 35.95 kg/m   Physical Exam: General: Alert and awake oriented x3 not in any acute distress. HEENT:  anicteric sclera, pupils reactive to light and accommodation CVS: S1-S2 clear no murmur rubs or gallops Chest: clear to auscultation bilaterally, no wheezing rales or rhonchi Abdomen: soft nontender, nondistended, normal bowel sounds Extremities: no cyanosis, clubbing or edema noted bilaterally Neuro: Cranial nerves II-XII intact, no focal neurological deficits   The results of significant diagnostics from this hospitalization (including imaging, microbiology, ancillary and laboratory) are listed below for reference.      Procedures/Studies:  Dg Chest 2 View  Result Date: 01/11/2019 CLINICAL DATA:  Cough, shortness of breath. EXAM: CHEST - 2 VIEW COMPARISON:  Radiographs of January 10, 2019. FINDINGS: Stable cardiomegaly. No pneumothorax or pleural effusion is noted. No acute pulmonary disease is noted. Bony thorax is unremarkable. IMPRESSION: No active cardiopulmonary disease. Electronically Signed   By: Lupita Raider, M.D.   On: 01/11/2019 15:51   Dg Chest 2 View  Result Date: 01/10/2019 CLINICAL DATA:  Worsening cough. EXAM: CHEST - 2 VIEW COMPARISON:  Chest x-ray 12/20/2017 and CT chest, same date. FINDINGS: The heart is mildly enlarged but stable. Stable tortuosity of the thoracic aorta. 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 Course interstitial markings and peribronchial thickening suggesting bronchitis. No definite infiltrates or effusions. IMPRESSION: 1. Stable cardiac enlargement. 2. Findings suggest bronchitis.  No infiltrates or effusions. Electronically Signed   By: Rudie Meyer M.D.   On: 01/10/2019 13:46   Ct Angio Chest Pe W/cm &/or Wo Cm  Result Date: 01/10/2019 CLINICAL DATA:  Chest pain, rule out PE EXAM: CT ANGIOGRAPHY CHEST WITH CONTRAST TECHNIQUE: Multidetector CT imaging  of the chest was performed using the standard protocol during bolus administration of intravenous contrast. Multiplanar CT image reconstructions and MIPs were obtained to evaluate the vascular anatomy. CONTRAST:  <See Chart> ISOVUE-370 IOPAMIDOL (ISOVUE-370) INJECTION 76% COMPARISON:  12/20/2018 FINDINGS: Cardiovascular: Satisfactory opacification of the pulmonary arteries to the segmental level. No evidence of pulmonary embolism. Scattered coronary artery calcifications. Cardiomegaly. No pericardial effusion. Mediastinum/Nodes: No enlarged mediastinal, hilar, or axillary lymph nodes. Redemonstrated large exophytic nodule of the inferior pole of the right lobe of the thyroid measuring at least 6.3 cm extending into the superior mediastinum. Trachea and esophagus demonstrate no significant findings. Lungs/Pleura: Bibasilar scarring or atelectasis. Occasional tiny pulmonary nodules as seen on prior examination, for example a 2 mm nodule of the right upper lobe (series 7, image 29). No pleural effusion or pneumothorax. Upper Abdomen: No acute abnormality. Musculoskeletal: No chest wall abnormality. No acute or significant osseous findings. Review of the MIP images confirms the above findings. IMPRESSION: 1.  Negative examination for pulmonary embolism. 2.  Cardiomegaly. 3. Redemonstrated large exophytic nodule of the inferior pole of the right lobe of the thyroid measuring at least 6.3 cm extending into the superior mediastinum. As on prior examination, recommend nonemergent ultrasound examination of the thyroid to further characterize. 4. Occasional tiny pulmonary nodules as seen on prior CT. As on prior, CT follow-up is optional for these nodules at 12 months if high risk factors for lung cancer are present. Electronically Signed   By: Lauralyn Primes M.D.   On: 01/10/2019 16:52   US Thyroid  Result Date: 01/12/2019 CLINICAL DATA:  Incidental on CT. Thyroid nodules incidentally noted on chest CT performed 01/10/2019  EXAM: THYROID ULTRASOUND TECHNIQUE: Ultrasound examination of the thyroid gland and adjacent soft tissues was performed. COMPARISON:  Chest CT-01/10/2019 FINDINGS: Parenchymal Echotexture: Moderately heterogenous Isthmus: Normal in size measures 0.4 cm in diameter Right lobe: Enlarged measuring 9.6 x 4.3 x 3.5 cm  Left lobe: Enlarged measuring 5.2 x 2.9 x 2.1 cm. _________________________________________________________ Estimated total number of nodules >/= 1 cm: 3 Number of spongiform nodules >/=  2 cm not described below (TR1): 0 Number of mixed cystic and solid nodules >/= 1.5 cm not described below (TR2): 0 _________________________________________________________ Nodule # 1: Location: Right; Inferior Maximum size: 6.0 cm; Other 2 dimensions: 3.2 x 2.9 cm Composition: solid/almost completely solid (2) Echogenicity: hypoechoic (2) Shape: not taller-than-wide (0) Margins: lobulated/irregular (2) Echogenic foci: none (0) ACR TI-RADS total points: 6. ACR TI-RADS risk category: TR4 (4-6 points). ACR TI-RADS recommendations: **Given size (>/= 1.5 cm) and appearance, fine needle aspiration of this moderately suspicious nodule should be considered based on TI-RADS criteria. _________________________________________________________ Nodule # 2: Location: Right; Mid Maximum size: 4.2 cm; Other 2 dimensions: 2.3 x 2.3 cm Composition: solid/almost completely solid (2) Echogenicity: isoechoic (1) Shape: not taller-than-wide (0) Margins: lobulated/irregular (2) Echogenic foci: macrocalcifications (1) ACR TI-RADS total points: 6. ACR TI-RADS risk category: TR4 (4-6 points). ACR TI-RADS recommendations: **Given size (>/= 1.5 cm) and appearance, fine needle aspiration of this moderately suspicious nodule should be considered based on TI-RADS criteria. _________________________________________________________ Nodule # 3: Location: Left; Mid Maximum size: 1.8 cm; Other 2 dimensions: 1.5 x 1.4 cm Composition: solid/almost completely  solid (2) Echogenicity: hyperechoic (1) Shape: not taller-than-wide (0) Margins: ill-defined (0) Echogenic foci: none (0) ACR TI-RADS total points: 3. ACR TI-RADS risk category: TR3 (3 points). ACR TI-RADS recommendations: *Given size (>/= 1.5 - 2.4 cm) and appearance, a follow-up ultrasound in 1 year should be considered based on TI-RADS criteria. _________________________________________________________ IMPRESSION: 1. Findings suggestive of multinodular goiter. 2. Nodules #1 and #2 both meet imaging criteria to recommend percutaneous sampling as clinically indicated. 3. Nodule #3 meets imaging criteria to recommend a 1 year follow-up. The above is in keeping with the ACR TI-RADS recommendations - J Am Coll Radiol 2017;14:587-595. Electronically Signed   By: Simonne Come M.D.   On: 01/12/2019 07:48       LAB RESULTS: Basic Metabolic Panel: Recent Labs  Lab 01/14/19 0612 01/15/19 0508  NA 139 138  K 3.6 4.0  CL 100 99  CO2 32 33*  GLUCOSE 166* 168*  BUN 25* 22*  CREATININE 0.47* 0.57*  CALCIUM 8.5* 8.3*   Liver Function Tests: No results for input(s): AST, ALT, ALKPHOS, BILITOT, PROT, ALBUMIN in the last 168 hours. No results for input(s): LIPASE, AMYLASE in the last 168 hours. No results for input(s): AMMONIA in the last 168 hours. CBC: Recent Labs  Lab 01/14/19 0612 01/15/19 0508  WBC 20.0* 18.6*  HGB 15.4 15.6  HCT 51.2 51.5  MCV 100.0 99.6  PLT 251 241   Cardiac Enzymes: No results for input(s): CKTOTAL, CKMB, CKMBINDEX, TROPONINI in the last 168 hours. BNP: Invalid input(s): POCBNP CBG: No results for input(s): GLUCAP in the last 168 hours.    Disposition and Follow-up: Discharge Instructions    Diet - low sodium heart healthy   Complete by:  As directed    Increase activity slowly   Complete by:  As directed        DISPOSITION: [facility]   DISCHARGE FOLLOW-UP Follow-up Information    [facility]. Schedule an  appointment as soon as possible for a visit in 1 week(s).   Specialty:  Family Medicine Why:  for hospital follow-up Contact information: [address] [place] [address] [place] [address] [phone]        Nyoka Cowden, MD Follow  up on 01/19/2019.   Specialty:  Pulmonary Disease Why:  at 11:00AM (Lung doctor)  Contact information: [address] [place] [id number] [phone]            Time coordinating discharge:  35 minutes  Signed:   Thad Rangeripudeep  M.D. [facility] 01/15/2019, 1:20 PM

## 2019-01-15 NOTE — Progress Notes (Signed)
Clinical Social Worker following patient for support and discharge needs. Patient came from [facility] prior to admitting to [facility]. CSW spoke with the director of nursing at [facility] Dois Davenport [phone]). Dois Davenport stated patient will not be able to discharge back to [facility] if needing oxygen. CSW made MD aware of [facility] statement. Per MD if patient is still needing O2 he will need to discharge home.   Marrianne Mood, MSW,  Theresia Majors [phone]

## 2019-01-15 NOTE — Care Management Note (Signed)
Case Management Note  Patient Details  Name: Selwyn Vito MRN: [id number] Date of Birth: 02-28-1961  Subjective/Objective:Acute resp failure. Hx: polysubstance abuse,smoker,benzo abuse. COPD. From [facility]. 02 sats qualify for home 02-[facility] will deliver home 02, & neb machine to rm once we have a location where patient will d/c to. CM has contacted [facility] rep Hezzie Bump. CM has also spoken to [facility] NP-they can provide meds for patient @ d/c if d/c & get there by 3p. Oterwise CM will use the [facility] program with override for the meds.                    Action/Plan:dc [facility]/or [facility] during the day, & shelter @ HS.   Expected Discharge Date:  01/15/19               Expected Discharge Plan:  Home w Home Health Services  In-House Referral:  Clinical Social Work  Discharge planning Services  CM Consult  Post Acute Care Choice:    Choice offered to:  Patient  DME Arranged:  Nebulizer machine, Oxygen DME Agency:     HH Arranged:    HH Agency:     Status of Service:  Completed, signed off  If discussed at [employer], dates discussed:    Additional Comments:  Lanier Clam, RN 01/15/2019, 11:55 AM

## 2019-01-15 NOTE — Progress Notes (Signed)
Patient has left unit x 2 after being instructed that he could not leave DT: cardiac and pulse ox monitoring, discharge paperwork and prescriptions placed in packet for [facility], instructions reviewed w/pt, verbalized understanding, IV and telemetry monitor removed, facility to transport

## 2019-01-15 NOTE — Progress Notes (Signed)
Clinical Biomedical scientist following for discharge needs. Patient is from [place] and would prefer to discharge back once medically stable. Patient aware that if patient is needing O2 upon discharge that facility will not be able to take him back. CSW spoke with patient at bedside to discuss discharge options outside of [facility]. Patient stated he is homeless and has no family in the area. CSW informed him that the only resource available to patient that are homeless are a list of homeless shelters. Patient became visible upset and started yelling at [place]. Patient stated "I will call the news on the hospital, you cant discharge me to a shelter". CSW attempted to calm patient down but patient continued to yell at [place]. CSW attempted to communicate with patient other discharge plans, wether it would be to discharge with friends in the area. CSW encouraged patient to contact sister to communicate with her that [facility] would not take patient back. CSW stepped out of patients room to allow him to calm down but to also come up with alternative discharge plans to assist patient  CSW spoke with [facility] of [facility] and she stated they might be able to still take patient with O2 if oxygen can be provided by Home health. CSW requested the assistance of RNCM on the floor. RNCM arranged oxygen to be delivered for patient at [facility]. At this time [facility] will take patient back with O2. CSW signing off as patients social work needs have been met.   Rhea Pink, MSW,  [place]

## 2019-01-15 NOTE — Progress Notes (Signed)
MD notified of the need for a dx to qualify for home 02.MD states QQ:IWLN she will put in the d/c summary. Received call from Steven Daniels [phone] about return back to [facility]-Patient can return back with 02-they have their own  Medical doctor, & they have a pharmacy for meds. They will have their staff to pick patient up form hospital to return back to [facility]. [facility] rep Clydie Braun will deliver home 02 to rm, & additional tanks,concentrator will be delivered to [facility].Cm will call when ready.Nsg/CSW updated.

## 2019-01-15 NOTE — Progress Notes (Signed)
SATURATION QUALIFICATIONS: (This note is used to comply with regulatory documentation for home oxygen)  Patient Saturations on Room Air at Rest = 94%  Patient Saturations on Room Air while Ambulating = 85%  Patient Saturations on 2 Liters of oxygen while Ambulating = 90-93%  Please briefly explain why patient needs home oxygen: to keep O2 sats > 88%

## 2019-01-15 NOTE — Care Management Note (Signed)
Case Management Note  Patient Details  Name: Steven Daniels MRN: [id number] Date of Birth: 1961/09/26  Subjective/Objective: Nolen Mu [phone] will pick patient up with their driver from main entrance-they will call the floor to pick up. Nsg updated. Patient has home 02,& neb machine.CM faxed d/c summary, & d/c instructions to [phone].                   Action/Plan:dc back to [facility] w/dme   Expected Discharge Date:  01/15/19               Expected Discharge Plan:  Home w Home Health Services  In-House Referral:  Clinical Social Work  Discharge planning Services  CM Consult  Post Acute Care Choice:    Choice offered to:  Patient  DME Arranged:  Nebulizer machine, Oxygen DME Agency:     HH Arranged:    HH Agency:     Status of Service:  Completed, signed off  If discussed at [employer], dates discussed:    Additional Comments:  Lanier Clam, RN 01/15/2019, 3:11 PM

## 2019-01-19 ENCOUNTER — Institutional Professional Consult (permissible substitution): Payer: Self-pay | Admitting: Internal Medicine

## 2019-05-08 ENCOUNTER — Emergency Department (HOSPITAL_COMMUNITY)
Admission: EM | Admit: 2019-05-08 | Discharge: 2019-05-09 | Disposition: A | Discharge status: Other Acute Inpatient Hospital | Payer: Self-pay | Attending: Emergency Medicine | Admitting: Emergency Medicine

## 2019-05-08 ENCOUNTER — Encounter (HOSPITAL_COMMUNITY): Payer: Self-pay | Admitting: Emergency Medicine

## 2019-05-08 ENCOUNTER — Other Ambulatory Visit: Payer: Self-pay

## 2019-05-08 DIAGNOSIS — Z79899 Other long term (current) drug therapy: Secondary | ICD-10-CM | POA: Insufficient documentation

## 2019-05-08 DIAGNOSIS — F1010 Alcohol abuse, uncomplicated: Secondary | ICD-10-CM | POA: Insufficient documentation

## 2019-05-08 DIAGNOSIS — I10 Essential (primary) hypertension: Secondary | ICD-10-CM | POA: Insufficient documentation

## 2019-05-08 DIAGNOSIS — F1490 Cocaine use, unspecified, uncomplicated: Secondary | ICD-10-CM | POA: Insufficient documentation

## 2019-05-08 DIAGNOSIS — Z20828 Contact with and (suspected) exposure to other viral communicable diseases: Secondary | ICD-10-CM | POA: Insufficient documentation

## 2019-05-08 DIAGNOSIS — Z046 Encounter for general psychiatric examination, requested by authority: Secondary | ICD-10-CM | POA: Insufficient documentation

## 2019-05-08 DIAGNOSIS — Y905 Blood alcohol level of 100-119 mg/100 ml: Secondary | ICD-10-CM | POA: Insufficient documentation

## 2019-05-08 DIAGNOSIS — F32A Depression, unspecified: Secondary | ICD-10-CM

## 2019-05-08 DIAGNOSIS — R45851 Suicidal ideations: Secondary | ICD-10-CM | POA: Insufficient documentation

## 2019-05-08 DIAGNOSIS — Z96641 Presence of right artificial hip joint: Secondary | ICD-10-CM | POA: Insufficient documentation

## 2019-05-08 DIAGNOSIS — J449 Chronic obstructive pulmonary disease, unspecified: Secondary | ICD-10-CM | POA: Insufficient documentation

## 2019-05-08 DIAGNOSIS — F329 Major depressive disorder, single episode, unspecified: Secondary | ICD-10-CM | POA: Insufficient documentation

## 2019-05-08 DIAGNOSIS — F17210 Nicotine dependence, cigarettes, uncomplicated: Secondary | ICD-10-CM | POA: Insufficient documentation

## 2019-05-08 LAB — COMPREHENSIVE METABOLIC PANEL
ALT: 179 U/L — ABNORMAL HIGH (ref 0–44)
AST: 119 U/L — ABNORMAL HIGH (ref 15–41)
Albumin: 3.6 g/dL (ref 3.5–5.0)
Alkaline Phosphatase: 131 U/L — ABNORMAL HIGH (ref 38–126)
Anion gap: 12 (ref 5–15)
BUN: 17 mg/dL (ref 6–20)
CO2: 23 mmol/L (ref 22–32)
Calcium: 8.9 mg/dL (ref 8.9–10.3)
Chloride: 104 mmol/L (ref 98–111)
Creatinine, Ser: 0.98 mg/dL (ref 0.61–1.24)
GFR calc Af Amer: >60 mL/min (ref >60)
GFR calc non Af Amer: >60 mL/min (ref >60)
Glucose, Bld: 123 mg/dL — ABNORMAL HIGH (ref 70–99)
Potassium: 3.6 mmol/L (ref 3.5–5.1)
Sodium: 139 mmol/L (ref 135–145)
Total Bilirubin: 0.8 mg/dL (ref 0.3–1.2)
Total Protein: 6.6 g/dL (ref 6.5–8.1)

## 2019-05-08 LAB — CBC
HCT: 53.1 % — ABNORMAL HIGH (ref 39.0–52.0)
Hemoglobin: 17.1 g/dL — ABNORMAL HIGH (ref 13.0–17.0)
MCH: 30.1 pg (ref 26.0–34.0)
MCHC: 32.2 g/dL (ref 30.0–36.0)
MCV: 93.3 fL (ref 80.0–100.0)
Platelets: 213 10*3/uL (ref 150–400)
RBC: 5.69 MIL/uL (ref 4.22–5.81)
RDW: 13.2 % (ref 11.5–15.5)
WBC: 10.4 10*3/uL (ref 4.0–10.5)
nRBC: 0 % (ref 0.0–0.2)

## 2019-05-08 LAB — RAPID URINE DRUG SCREEN, HOSP PERFORMED
Amphetamines: NOT DETECTED
Barbiturates: NOT DETECTED
Benzodiazepines: NOT DETECTED
Cocaine: POSITIVE — AB
Opiates: NOT DETECTED
Tetrahydrocannabinol: NOT DETECTED

## 2019-05-08 LAB — ACETAMINOPHEN LEVEL: Acetaminophen (Tylenol), Serum: <10 ug/mL — ABNORMAL LOW (ref 10–30)

## 2019-05-08 LAB — ETHANOL: Alcohol, Ethyl (B): 105 mg/dL — ABNORMAL HIGH (ref <10)

## 2019-05-08 LAB — SALICYLATE LEVEL: Salicylate Lvl: <7 mg/dL (ref 2.8–30.0)

## 2019-05-08 MED ORDER — NICOTINE 21 MG/24HR TD PT24: 21.0000 mg | MEDICATED_PATCH | Freq: Every day | TRANSDERMAL | Status: DC

## 2019-05-08 MED ORDER — LORAZEPAM 2 MG/ML IJ SOLN: 0.0000 mg | Freq: Four times a day (QID) | INTRAMUSCULAR | Status: DC

## 2019-05-08 MED ORDER — LORAZEPAM 1 MG PO TABS
0.0000 mg | ORAL_TABLET | Freq: Four times a day (QID) | ORAL | Status: DC
  Administered 2019-05-08: 1 mg via ORAL
  Filled 2019-05-08: qty 1

## 2019-05-08 MED ORDER — VITAMIN B-1 100 MG PO TABS
100.0000 mg | ORAL_TABLET | Freq: Every day | ORAL | Status: DC
  Administered 2019-05-08: 100 mg via ORAL
  Filled 2019-05-08: qty 1

## 2019-05-08 MED ORDER — THIAMINE HCL 100 MG/ML IJ SOLN: 100.0000 mg | Freq: Every day | INTRAMUSCULAR | Status: DC

## 2019-05-08 MED ORDER — PROPRANOLOL HCL 40 MG PO TABS
40.0000 mg | ORAL_TABLET | Freq: Two times a day (BID) | ORAL | Status: DC
  Administered 2019-05-08: 40 mg via ORAL
  Filled 2019-05-08: qty 1

## 2019-05-08 MED ORDER — LORAZEPAM 2 MG/ML IJ SOLN: 0.0000 mg | Freq: Two times a day (BID) | INTRAMUSCULAR | Status: DC

## 2019-05-08 MED ORDER — LORAZEPAM 1 MG PO TABS: 0.0000 mg | ORAL_TABLET | Freq: Two times a day (BID) | ORAL | Status: DC

## 2019-05-08 MED ORDER — GABAPENTIN 300 MG PO CAPS: 300.0000 mg | ORAL_CAPSULE | ORAL | Status: DC

## 2019-05-08 NOTE — ED Triage Notes (Addendum)
Pt states he is feeling suicidal and wants to seek help. Pt states he has been to rehab for drinking and was sober for a year. But recently relapsed approx a week ago. Pt sweating profusely. States he drank 9 airplane sized bottles of fireball and 3, 40's. Pt also states he used cocaine yesterday.

## 2019-05-08 NOTE — ED Notes (Signed)
Steven Romp, NP, patient meets inpatient criteria. Steven Daniels, accepted to [facility] Room 304 Bed 1.  Arrival time is now.  Attending is Dr. Parke Poisson.  Report to [phone].  Monique, RN, informed of acceptance.

## 2019-05-08 NOTE — ED Notes (Signed)
Pt's belongings placed in bag, at the triage desk.

## 2019-05-08 NOTE — ED Provider Notes (Signed)
[place] EMERGENCY DEPARTMENT Provider Note   CSN: [id number] Arrival date & time: 05/08/19  1850    History   Chief Complaint Chief Complaint  Patient presents with   Suicidal    HPI Steven Daniels is a 58 y.o. male presenting for evaluation of suicidal ideations.  Patient states he has been "feeling very despondent."  Patient states today he was having suicidal thoughts, thought about killing himself by using "hot heroin or hanging himself."  He did not attempt to hurt himself. He denies HI or AVH.  He states he has been taking his medication as prescribed, but it is no longer working.  He has been in and out of rehab recently, although had been sober prior to the past couple months.  Patient states he has had "a lot" to drink, stating he has had 9 whiskey shooters, multiple 40s.  He reports smoking crack yesterday, no drugs today.  He denies other drug use.  Patient states he has been feeling very tired, does not even want to get out of the bed.  This is been getting gradually worse over months.  He has no energy, and no drive to do anything.     HPI  Past Medical History:  Diagnosis Date   Alcohol abuse    Bipolar affective disorder ([place])    Depression    Hard of hearing    Heart murmur    per pt 12/28/15   Hepatitis C    Hypertension    Seizures ([place])     Patient Active Problem List   Diagnosis Date Noted   Acute respiratory failure with hypoxia ([place]) 01/11/2019   COPD with acute exacerbation ([place]) 01/11/2019   Acute respiratory failure ([place]) 01/11/2019   Alcohol abuse with alcohol-induced mood disorder ([place]) 02/19/2018   Bipolar 1 disorder, depressed, severe ([place]) 02/19/2018   Chronic hepatitis C without hepatic coma ([place]) 12/29/2017   Polysubstance abuse ([place]) 12/29/2017   IVDU (intravenous drug user) 12/29/2017   Chronic pain 12/29/2017   Cigarette smoker 12/29/2017   Hearing loss 12/29/2017   H/O total hip  arthroplasty, right 12/29/2017   Cocaine abuse ([place]) 12/09/2017   Bipolar affective disorder, current episode mixed ([place]) 03/14/2016   Alcohol dependence ([place]) 12/29/2015   Severe recurrent major depression without psychotic features ([place]) 12/29/2015    Past Surgical History:  Procedure Laterality Date   HIP FRACTURE SURGERY Right    x 3   KNEE SURGERY Right         Home Medications    Prior to Admission medications   Medication Sig Start Date End Date Taking? Authorizing Provider  albuterol (PROVENTIL HFA;VENTOLIN HFA) 108 (90 Base) MCG/ACT inhaler Inhale 2 puffs into the lungs 4 (four) times daily as needed for wheezing or shortness of breath. 01/15/19  Yes Rai, Ripudeep K, MD  alum & mag hydroxide-simeth (MYLANTA) 200-200-20 MG/5ML suspension Take 15-30 mLs by mouth every 6 (six) hours as needed for indigestion or heartburn.   Yes [provider]  buPROPion (WELLBUTRIN XL) 300 MG 24 hr tablet Take 300 mg by mouth daily.   Yes [provider]  gabapentin (NEURONTIN) 300 MG capsule Take 1 capsule (300 mg total) by mouth 3 (three) times daily. Patient taking differently: Take 300 mg by mouth See admin instructions. Take 300 mg by mouth two times a day and an additional 300 mg once a day as needed for nerve pain 01/15/19  Yes Rai, Ripudeep K, MD  ibuprofen (ADVIL,MOTRIN) 600  MG tablet Take 600 mg by mouth 2 (two) times a day.    Yes [provider]  Multiple Vitamin-Folic Acid TABS Take 1 tablet by mouth daily with lunch.    Yes [provider]  propranolol (INDERAL) 80 MG tablet Take 1 tablet (80 mg total) by mouth daily. Patient taking differently: Take 40 mg by mouth 2 (two) times daily.  06/11/18  Yes Linus MakoBurky, Natalie B, NP  traZODone (DESYREL) 50 MG tablet Take 50 mg by mouth at bedtime.    Yes [provider]  albuterol (PROVENTIL) (2.5 MG/3ML) 0.083% nebulizer solution Take 3 mLs (2.5 mg total) by nebulization 3 (three) times  daily. Patient not taking: Reported on 05/08/2019 01/15/19   Rai, Delene Ruffiniipudeep K, MD  benzonatate (TESSALON) 100 MG capsule Take 2 capsules (200 mg total) by mouth 3 (three) times daily as needed for cough. Patient not taking: Reported on 05/08/2019 01/15/19   Rai, Delene Ruffiniipudeep K, MD  cetirizine (ZYRTEC) 10 MG tablet Take 10 mg by mouth daily as needed for allergies.    [provider]  Fructose-Dextrose-Phosphor Acd (NAUSEA CONTROL) 1.87-1.87-21.5 SOLN Take 5-10 mLs by mouth daily as needed (nausea).    [provider]  guaiFENesin (MUCINEX) 600 MG 12 hr tablet Take 1 tablet (600 mg total) by mouth 2 (two) times daily. 9-5 Congestion Patient not taking: Reported on 05/08/2019 01/15/19   Rai, Delene Ruffiniipudeep K, MD  loperamide (IMODIUM A-D) 2 MG tablet Take 2 mg by mouth 4 (four) times daily as needed for diarrhea or loose stools.    [provider]  nicotine (NICODERM CQ - DOSED IN MG/24 HOURS) 21 mg/24hr patch Place 1 patch (21 mg total) onto the skin daily. Patient not taking: Reported on 05/08/2019 01/15/19   Rai, Delene Ruffiniipudeep K, MD  ondansetron (ZOFRAN) 8 MG tablet Take 8 mg by mouth 2 (two) times daily as needed for nausea or vomiting.    [provider]  predniSONE (DELTASONE) 10 MG tablet Prednisone dosing: Take  Prednisone 40mg  (4 tabs) x 3 days, then taper to 30mg  (3 tabs) x 3 days, then 20mg  (2 tabs) x 3days, then 10mg  (1 tab) x 3days, then OFF.  Dispense:  30 tabs, refills: None Patient not taking: Reported on 05/08/2019 01/16/19   Rai, Delene Ruffiniipudeep K, MD  Sennosides (LAXATIVE) 25 MG TABS Take 1 tablet by mouth daily as needed (constipation).    [provider]  thiamine (VITAMIN B-1) 100 MG tablet Take 100 mg by mouth daily.    [provider]  umeclidinium-vilanterol (ANORO ELLIPTA) 62.5-25 MCG/INH AEPB Inhale 1 puff into the lungs daily. Patient not taking: Reported on 05/08/2019 01/15/19   Cathren Harshai, Ripudeep K, MD    Family History Family History  Problem Relation Age  of Onset   Other Mother        brain tumor behind left ear   Alzheimer's disease Mother    Skin cancer Father        melatomia   Breast cancer Maternal Grandmother    Heart disease Paternal Uncle    Heart disease Cousin        fathers side   Colon cancer Neg Hx    Esophageal cancer Neg Hx     Social History Social History   Tobacco Use   Smoking status: Current Every Day Smoker    Packs/day: 1.00    Types: Cigarettes   Smokeless tobacco: Never Used  Substance Use Topics   Alcohol use: Yes    Frequency: Never  Comment: patient states he is in rehab   Drug use: Not Currently    Types: Cocaine    Comment: crack, heroin -snort last use 12/27/2017     Allergies   Other; Apple; Cherry; Plum pulp; Lisinopril; and Penicillins   Review of Systems Review of Systems  Psychiatric/Behavioral: Positive for dysphoric mood, self-injury and suicidal ideas.  All other systems reviewed and are negative.    Physical Exam Updated Vital Signs BP (!) 128/100    Pulse 81    Temp 98.8 F (37.1 C) (Oral)    Resp 16    SpO2 94%   Physical Exam Vitals signs and nursing note reviewed.  Constitutional:      General: He is not in acute distress.    Appearance: He is well-developed.     Comments: Appears nontoxic  HENT:     Head: Normocephalic and atraumatic.     Comments: Hard of hearing Eyes:     Extraocular Movements: Extraocular movements intact.     Conjunctiva/sclera: Conjunctivae normal.     Pupils: Pupils are equal, round, and reactive to light.  Neck:     Musculoskeletal: Normal range of motion and neck supple.  Cardiovascular:     Rate and Rhythm: Normal rate and regular rhythm.     Pulses: Normal pulses.  Pulmonary:     Effort: Pulmonary effort is normal. No respiratory distress.     Breath sounds: Normal breath sounds. No wheezing.  Abdominal:     General: There is no distension.     Palpations: Abdomen is soft. There is no mass.     Tenderness: There  is no abdominal tenderness. There is no guarding or rebound.  Musculoskeletal: Normal range of motion.  Skin:    General: Skin is warm and dry.     Capillary Refill: Capillary refill takes less than 2 seconds.  Neurological:     Mental Status: He is alert and oriented to person, place, and time.  Psychiatric:        Attention and Perception: He does not perceive auditory or visual hallucinations.        Mood and Affect: Affect is blunt.        Thought Content: Thought content includes suicidal ideation. Thought content does not include homicidal ideation. Thought content includes suicidal plan. Thought content does not include homicidal plan.      ED Treatments / Results  Labs (all labs ordered are listed, but only abnormal results are displayed) Labs Reviewed  COMPREHENSIVE METABOLIC PANEL - Abnormal; Notable for the following components:      Result Value   Glucose, Bld 123 (*)    AST 119 (*)    ALT 179 (*)    Alkaline Phosphatase 131 (*)    All other components within normal limits  ETHANOL - Abnormal; Notable for the following components:   Alcohol, Ethyl (B) 105 (*)    All other components within normal limits  ACETAMINOPHEN LEVEL - Abnormal; Notable for the following components:   Acetaminophen (Tylenol), Serum <10 (*)    All other components within normal limits  CBC - Abnormal; Notable for the following components:   Hemoglobin 17.1 (*)    HCT 53.1 (*)    All other components within normal limits  RAPID URINE DRUG SCREEN, HOSP PERFORMED - Abnormal; Notable for the following components:   Cocaine POSITIVE (*)    All other components within normal limits  NOVEL CORONAVIRUS, NAA (HOSPITAL ORDER, SEND-OUT TO REF LAB)  SALICYLATE  LEVEL    EKG None  Radiology No results found.  Procedures Procedures (including critical care time)  Medications Ordered in ED Medications  LORazepam (ATIVAN) injection 0-4 mg ( Intravenous See Alternative 05/08/19 2216)    Or   LORazepam (ATIVAN) tablet 0-4 mg (1 mg Oral Given 05/08/19 2216)  LORazepam (ATIVAN) injection 0-4 mg (has no administration in time range)    Or  LORazepam (ATIVAN) tablet 0-4 mg (has no administration in time range)  thiamine (VITAMIN B-1) tablet 100 mg (100 mg Oral Given 05/08/19 2216)    Or  thiamine (B-1) injection 100 mg ( Intravenous See Alternative 05/08/19 2216)  gabapentin (NEURONTIN) capsule 300 mg (has no administration in time range)  propranolol (INDERAL) tablet 40 mg (40 mg Oral Given 05/08/19 2216)  nicotine (NICODERM CQ - dosed in mg/24 hours) patch 21 mg (21 mg Transdermal Not Given 05/08/19 2219)     Initial Impression / Assessment and Plan / ED Course  I have reviewed the triage vital signs and the nursing notes.  Pertinent labs & imaging results that were available during my care of the patient were reviewed by me and considered in my medical decision making (see chart for details).        Patient presenting for evaluation of depression and suicidal thoughts.  Physical exam shows patient is calm and cooperative.  As symptoms are worsening, and patient is reporting SI with the plan, will consult with TTS. Medical clearance labs ordered.  Pt placed on CIWA precautions.  cmp shows mild elevation in liver function, which is unsurprising given patient's recent increase in alcohol intake.  Per chart review, patient has had elevation in his LFTs previously.  I do not believe this is what is causing his increased SI today, and treatment of his depression may help with his alcohol use and thus his liver function.  Home meds ordered.  covid test ordered.  TTS evaluated pt, recommends inpatient[facility]    Final Clinical Impressions(s) / ED Diagnoses   Final diagnoses:  Depression, unspecified depression type  Suicidal thoughts  Alcohol abuse  Cocaine use    ED Discharge Orders    None       Alveria ApleyCaccavale, Mennie Spiller, PA-C 05/08/19 2351    Linwood DibblesKnapp, Jon, MD 05/10/19  [phone]

## 2019-05-08 NOTE — ED Notes (Signed)
Gave pt maroon scrubs to change into

## 2019-05-08 NOTE — BH Assessment (Signed)
Tele Assessment Note   Patient Name: Simone CuriaClay Garske MRN: [id number] Referring Physician: Alveria ApleySophia Caccavale Location of Patient: [facility] Location of Provider: Behavioral Health TTS Department  Simone CuriaClay Stargell is an 58 y.o. male presenting with SI with plan to hang himself. Patient brought himself into ED. Per RN, patient admitted to plan of killing self by "hot heroin or hanging himself". Patient denied HI and psychosis. Patient reported being in rehab for drinking and was sober for a year. But recently relapsed approx a week ago. Patient admitted to drinking 9 airplane sized bottles of fireball and 3x 40's. Patient reported stressor of relapsing 1 week ago, "I am tired of the cycle, no change in my life". Patient reported change in medication 4-5 months ago to Welbutrin which make him feel "off, more depressed, decreased grooming myself". Patient reported Welbutrin is not working so he stopped taking it. Patient reported Inpatient mental health [facility]. Patient reported no past suicidal attempts and no self-harming behaviors.   Patient reported having "a lot" to drink, stating he has had 9 whiskey shooters, multiple 40s. Patient reported smoking crack yesterday, no drugs today.  Patient states he has been feeling very tired, does not even want to get out of the bed.  This is been getting gradually worse over months.  He has no energy, and no drive to do anything. [facility]. Patient was calm and cooperative during assessment.   BAL 105 UDS +cocaine  Diagnosis: Major depressive disorder and alcohol abuse  Past Medical History:  Past Medical History:  Diagnosis Date  . Alcohol abuse   . Bipolar affective disorder (HCC)   . Depression   . Hard of hearing   . Heart murmur    per pt 12/28/15  . Hepatitis C   . Hypertension   . Seizures (H[facility])     Past Surgical History:  Procedure Laterality Date  . HIP  FRACTURE SURGERY Right    x 3  . KNEE SURGERY Right     Family History:  Family History  Problem Relation Age of Onset  . Other Mother        brain tumor behind left ear  . Alzheimer's disease Mother   . Skin cancer Father        melatomia  . Breast cancer Maternal Grandmother   . Heart disease Paternal Uncle   . Heart disease Cousin        fathers side  . Colon cancer Neg Hx   . Esophageal cancer Neg Hx     Social History:  reports that he has been smoking cigarettes. He has been smoking about 1.00 pack per day. He has never used smokeless tobacco. He reports current alcohol use. He reports previous drug use. Drug: Cocaine.  Additional Social History:  Alcohol / Drug Use Pain Medications: see MAR Prescriptions: see MAR Over the Counter: see MAR  CIWA: CIWA-Ar BP: (!) 128/100 Pulse Rate: 81 Nausea and Vomiting: no nausea and no vomiting Tactile Disturbances: none Tremor: not visible, but can be felt fingertip to fingertip Auditory Disturbances: not present Paroxysmal Sweats: barely perceptible sweating, palms moist Visual Disturbances: not present Anxiety: mildly anxious Headache, Fullness in Head: mild Agitation: normal activity Orientation and Clouding of Sensorium: oriented and can do serial additions CIWA-Ar Total: 5 COWS:    Allergies:  Allergies  Allergen Reactions  . Other Shortness Of Breath    Reaction to cat dander  .  Apple Swelling and Other (See Comments)    Raw apples cause gum swelling  . Cherry Swelling and Other (See Comments)    Raw cherries cause gum swelling  . Plum Pulp Swelling and Other (See Comments)    Raw plums cause gum swelling  . Lisinopril Hives and Rash  . Penicillins Rash    Allergic reaction as a 58 year old Has patient had a PCN reaction causing immediate rash, facial/tongue/throat swelling, SOB or lightheadedness with hypotension: Yes Has patient had a PCN reaction causing severe rash involving mucus membranes or skin  necrosis: No Has patient had a PCN reaction that required hospitalization: No Has patient had a PCN reaction occurring within the last 10 years: No If all of the above answers are "NO", then may proceed with Cephalosporin use.    Home Medications: (Not in a hospital admission)   OB/GYN Status:  No LMP for male patient.  General Assessment Data [facility] Assessment: In system Is this a Tele or Face-to-Face Assessment?: Tele Assessment Is this an Initial Assessment or a Re-assessment for this encounter?: Initial Assessment Patient Accompanied by:: N/A Language Other than English: No Living Arrangements: ([facility] [place]) What gender do you identify as?: Male Marital status: Single Living Arrangements: ([facility] [place]) Can pt return to current living arrangement?: Yes Is patient capable of signing voluntary admission?: Yes Referral Source: Self/Family/Friend     Crisis Care Plan Living Arrangements: ([facility] [place]) Legal Guardian: (self) Name of Psychiatrist: Vesta Mixer([facility]) Name of Therapist: (Mr Shawnie PonsGattis and [facility]  Education Status Is patient currently in school?: No Is the patient employed, unemployed or receiving disability?: Unemployed  Risk to self with the past 6 months Suicidal Ideation: Yes-Currently Present Has patient been a risk to self within the past 6 months prior to admission? : Yes Suicidal Intent: Yes-Currently Present Has patient had any suicidal intent within the past 6 months prior to admission? : Yes Is patient at risk for suicide?: Yes Suicidal Plan?: Yes-Currently Present Has patient had any suicidal plan within the past 6 months prior to admission? : Yes Specify Current Suicidal Plan: (hang himself) Access to Means: Yes Specify Access to Suicidal Means: (rope ) What has been your use of drugs/alcohol within the last 12 months?: (cocaine and alcohol) Previous  Attempts/Gestures: No How many times?: (0) Other Self Harm Risks: (n/a) Triggers for Past Attempts: (relapse) Intentional Self Injurious Behavior: None Family Suicide History: No Recent stressful life event(s): (relapse) Persecutory voices/beliefs?: No Depression: Yes Depression Symptoms: Insomnia, Tearfulness, Isolating, Fatigue, Guilt, Loss of interest in usual pleasures, Feeling worthless/self pity Substance abuse history and/or treatment for substance abuse?: No Suicide prevention information given to non-admitted patients: Not applicable  Risk to Others within the past 6 months Homicidal Ideation: No Does patient have any lifetime risk of violence toward others beyond the six months prior to admission? : No Thoughts of Harm to Others: No Current Homicidal Intent: No Current Homicidal Plan: No Access to Homicidal Means: No Identified Victim: (n/a) History of harm to others?: No Assessment of Violence: None Noted Violent Behavior Description: (n/a) Does patient have access to weapons?: No Criminal Charges Pending?: No Does patient have a court date: No Is patient on probation?: No  Psychosis Hallucinations: None noted Delusions: None noted  Mental Status Report Appearance/Hygiene: In scrubs Eye Contact: Fair Motor Activity: Freedom of movement Speech: Logical/coherent Level of Consciousness: Alert Mood: Depressed, Anxious  Affect: Depressed, Anxious Anxiety Level: Moderate Thought Processes: Coherent, Relevant Judgement: Impaired Orientation: Person, Place, Time, Situation Obsessive Compulsive Thoughts/Behaviors: None  Cognitive Functioning Concentration: Fair Memory: Recent Intact Is patient IDD: No Insight: Poor Impulse Control: Poor Appetite: Poor Have you had any weight changes? : No Change Sleep: Decreased Total Hours of Sleep: (not a lot) Vegetative Symptoms: Staying in bed, Decreased grooming  ADLScreening [facility] Assessment Services) Patient's  cognitive ability adequate to safely complete daily activities?: Yes Patient able to express need for assistance with ADLs?: Yes Independently performs ADLs?: Yes (appropriate for developmental age)  Prior Inpatient Therapy Prior Inpatient Therapy: Yes Prior Therapy Dates: (01/2018) Prior Therapy Facilty[facility]Provider([facility]): [facility]) Reason for Treatment: (depression)  Prior Outpatient Therapy Prior Outpatient Therapy: Yes Prior Therapy Dates: (present) Prior Therapy Facilty/Provider(s): ([place]) Reason for Treatment: (alcohol abuse) Does patient have an ACCT team?: No Does patient have Intensive In-House Services?  : No Does patient have [facility] services? : No Does patient have P4CC services?: No  ADL Screening (condition at time of admission) Patient's cognitive ability adequate to safely complete daily activities?: Yes Patient able to express need for assistance with ADLs?: Yes Independently performs ADLs?: Yes (appropriate for developmental age) Regulatory affairs officer (For Healthcare) Does Patient Have a Medical Advance Directive?: No    Disposition:  Disposition Initial Assessment Completed for this Encounter: Yes  Lindon Romp, NP, patient meets inpatient criteria. Larose [place], accepted to [facility] Room 304 Bed 1. Arrival time is now. Attending is Dr. Parke Poisson. Report to [phone]. Monique, RN, informed of acceptance.   This service was provided via telemedicine using a 2-way, interactive audio and video technology.  Names of all persons participating in this telemedicine service and their role in this encounter. Name: [facility] Role: Patient  Name: Kirtland Bouchard Role: TTS Clinician  Name:  Role:   Name:  Role:     Venora Maples 05/08/2019 11:01 PM

## 2019-05-08 NOTE — ED Notes (Signed)
TTS in process 

## 2019-05-09 ENCOUNTER — Inpatient Hospital Stay (HOSPITAL_COMMUNITY)
Admission: AD | Admit: 2019-05-09 | Discharge: 2019-05-16 | DRG: 885 | Disposition: A | Discharge status: Home / Self Care | Payer: No Typology Code available for payment source | Source: Intra-hospital | Attending: Psychiatry | Admitting: Psychiatry

## 2019-05-09 ENCOUNTER — Encounter (HOSPITAL_COMMUNITY): Payer: Self-pay

## 2019-05-09 ENCOUNTER — Other Ambulatory Visit: Payer: Self-pay

## 2019-05-09 DIAGNOSIS — F1024 Alcohol dependence with alcohol-induced mood disorder: Secondary | ICD-10-CM | POA: Diagnosis present

## 2019-05-09 DIAGNOSIS — F10239 Alcohol dependence with withdrawal, unspecified: Secondary | ICD-10-CM | POA: Diagnosis present

## 2019-05-09 DIAGNOSIS — G4700 Insomnia, unspecified: Secondary | ICD-10-CM | POA: Diagnosis present

## 2019-05-09 DIAGNOSIS — I10 Essential (primary) hypertension: Secondary | ICD-10-CM | POA: Diagnosis present

## 2019-05-09 DIAGNOSIS — R45851 Suicidal ideations: Secondary | ICD-10-CM | POA: Diagnosis present

## 2019-05-09 DIAGNOSIS — G4733 Obstructive sleep apnea (adult) (pediatric): Secondary | ICD-10-CM | POA: Diagnosis present

## 2019-05-09 DIAGNOSIS — Z888 Allergy status to other drugs, medicaments and biological substances status: Secondary | ICD-10-CM

## 2019-05-09 DIAGNOSIS — K219 Gastro-esophageal reflux disease without esophagitis: Secondary | ICD-10-CM | POA: Diagnosis present

## 2019-05-09 DIAGNOSIS — F1424 Cocaine dependence with cocaine-induced mood disorder: Secondary | ICD-10-CM | POA: Diagnosis present

## 2019-05-09 DIAGNOSIS — E1165 Type 2 diabetes mellitus with hyperglycemia: Secondary | ICD-10-CM | POA: Diagnosis present

## 2019-05-09 DIAGNOSIS — J449 Chronic obstructive pulmonary disease, unspecified: Secondary | ICD-10-CM | POA: Diagnosis present

## 2019-05-09 DIAGNOSIS — Z79899 Other long term (current) drug therapy: Secondary | ICD-10-CM | POA: Diagnosis not present

## 2019-05-09 DIAGNOSIS — F332 Major depressive disorder, recurrent severe without psychotic features: Principal | ICD-10-CM | POA: Diagnosis present

## 2019-05-09 DIAGNOSIS — F17210 Nicotine dependence, cigarettes, uncomplicated: Secondary | ICD-10-CM | POA: Diagnosis present

## 2019-05-09 DIAGNOSIS — F419 Anxiety disorder, unspecified: Secondary | ICD-10-CM | POA: Diagnosis present

## 2019-05-09 DIAGNOSIS — Z880 Allergy status to penicillin: Secondary | ICD-10-CM

## 2019-05-09 MED ORDER — LORAZEPAM 1 MG PO TABS
1.0000 mg | ORAL_TABLET | Freq: Four times a day (QID) | ORAL | Status: DC | PRN
  Administered 2019-05-09: 08:00:00 1 mg via ORAL
  Filled 2019-05-09: qty 1

## 2019-05-09 MED ORDER — LORAZEPAM 1 MG PO TABS
1.0000 mg | ORAL_TABLET | Freq: Four times a day (QID) | ORAL | Status: AC
  Administered 2019-05-09 – 2019-05-10 (×5): 1 mg via ORAL
  Filled 2019-05-09 (×7): qty 1

## 2019-05-09 MED ORDER — LOPERAMIDE HCL 2 MG PO CAPS: 2.0000 mg | ORAL_CAPSULE | ORAL | Status: AC | PRN

## 2019-05-09 MED ORDER — LORAZEPAM 1 MG PO TABS
1.0000 mg | ORAL_TABLET | Freq: Two times a day (BID) | ORAL | Status: AC
  Administered 2019-05-12 (×2): 1 mg via ORAL
  Filled 2019-05-09: qty 1

## 2019-05-09 MED ORDER — LOPERAMIDE HCL 2 MG PO CAPS: 2.0000 mg | ORAL_CAPSULE | ORAL | Status: DC | PRN

## 2019-05-09 MED ORDER — LORAZEPAM 1 MG PO TABS
1.0000 mg | ORAL_TABLET | Freq: Three times a day (TID) | ORAL | Status: AC
  Administered 2019-05-11 (×2): 1 mg via ORAL
  Filled 2019-05-09: qty 1

## 2019-05-09 MED ORDER — VITAMIN B-1 100 MG PO TABS
100.0000 mg | ORAL_TABLET | Freq: Every day | ORAL | Status: DC
  Administered 2019-05-10 – 2019-05-16 (×7): 100 mg via ORAL
  Filled 2019-05-09 (×9): qty 1

## 2019-05-09 MED ORDER — MAGNESIUM HYDROXIDE 400 MG/5ML PO SUSP: 30.0000 mL | Freq: Every day | ORAL | Status: DC | PRN

## 2019-05-09 MED ORDER — LORAZEPAM 1 MG PO TABS
1.0000 mg | ORAL_TABLET | Freq: Every day | ORAL | Status: AC
  Administered 2019-05-13: 1 mg via ORAL
  Filled 2019-05-09: qty 1

## 2019-05-09 MED ORDER — ONDANSETRON 4 MG PO TBDP
4.0000 mg | ORAL_TABLET | Freq: Four times a day (QID) | ORAL | Status: AC | PRN
  Administered 2019-05-10: 4 mg via ORAL
  Filled 2019-05-09: qty 1

## 2019-05-09 MED ORDER — NICOTINE 21 MG/24HR TD PT24
21.0000 mg | MEDICATED_PATCH | Freq: Every day | TRANSDERMAL | Status: DC
  Administered 2019-05-10 – 2019-05-16 (×7): 21 mg via TRANSDERMAL
  Filled 2019-05-09 (×12): qty 1

## 2019-05-09 MED ORDER — ALBUTEROL SULFATE HFA 108 (90 BASE) MCG/ACT IN AERS: 2.0000 | INHALATION_SPRAY | Freq: Four times a day (QID) | RESPIRATORY_TRACT | Status: DC | PRN

## 2019-05-09 MED ORDER — VITAMIN B-1 100 MG PO TABS
100.0000 mg | ORAL_TABLET | Freq: Every day | ORAL | Status: DC
  Filled 2019-05-09 (×2): qty 1

## 2019-05-09 MED ORDER — ONDANSETRON 4 MG PO TBDP: 4.0000 mg | ORAL_TABLET | Freq: Four times a day (QID) | ORAL | Status: DC | PRN

## 2019-05-09 MED ORDER — PROPRANOLOL HCL 40 MG PO TABS
40.0000 mg | ORAL_TABLET | Freq: Two times a day (BID) | ORAL | Status: DC
  Administered 2019-05-09 – 2019-05-16 (×15): 40 mg via ORAL
  Filled 2019-05-09: qty 1
  Filled 2019-05-09: qty 14
  Filled 2019-05-09 (×9): qty 1
  Filled 2019-05-09: qty 14
  Filled 2019-05-09 (×7): qty 1
  Filled 2019-05-09: qty 4

## 2019-05-09 MED ORDER — TRAZODONE HCL 50 MG PO TABS
50.0000 mg | ORAL_TABLET | Freq: Every day | ORAL | Status: DC
  Filled 2019-05-09 (×2): qty 1

## 2019-05-09 MED ORDER — THIAMINE HCL 100 MG/ML IJ SOLN: 100.0000 mg | Freq: Once | INTRAMUSCULAR | Status: DC

## 2019-05-09 MED ORDER — LORAZEPAM 1 MG PO TABS
1.0000 mg | ORAL_TABLET | Freq: Four times a day (QID) | ORAL | Status: AC | PRN
  Administered 2019-05-11 – 2019-05-12 (×3): 1 mg via ORAL
  Filled 2019-05-09 (×6): qty 1

## 2019-05-09 MED ORDER — TRAZODONE HCL 50 MG PO TABS
50.0000 mg | ORAL_TABLET | Freq: Every evening | ORAL | Status: DC | PRN
  Administered 2019-05-09 – 2019-05-15 (×3): 50 mg via ORAL
  Filled 2019-05-09 (×5): qty 1
  Filled 2019-05-09: qty 7
  Filled 2019-05-09: qty 1

## 2019-05-09 MED ORDER — PANTOPRAZOLE SODIUM 40 MG PO TBEC
40.0000 mg | DELAYED_RELEASE_TABLET | Freq: Every day | ORAL | Status: DC
  Administered 2019-05-10 – 2019-05-16 (×7): 40 mg via ORAL
  Filled 2019-05-09 (×9): qty 1
  Filled 2019-05-09: qty 7
  Filled 2019-05-09: qty 1

## 2019-05-09 MED ORDER — ADULT MULTIVITAMIN W/MINERALS CH
1.0000 | ORAL_TABLET | Freq: Every day | ORAL | Status: DC
  Administered 2019-05-09 – 2019-05-16 (×8): 1 via ORAL
  Filled 2019-05-09 (×10): qty 1

## 2019-05-09 MED ORDER — HYDROXYZINE HCL 25 MG PO TABS
25.0000 mg | ORAL_TABLET | Freq: Four times a day (QID) | ORAL | Status: AC | PRN
  Administered 2019-05-10 – 2019-05-12 (×3): 25 mg via ORAL
  Filled 2019-05-09 (×3): qty 1

## 2019-05-09 MED ORDER — FLUOXETINE HCL 20 MG PO CAPS
20.0000 mg | ORAL_CAPSULE | Freq: Every day | ORAL | Status: DC
  Administered 2019-05-09 – 2019-05-11 (×3): 20 mg via ORAL
  Filled 2019-05-09 (×5): qty 1

## 2019-05-09 MED ORDER — ALUM & MAG HYDROXIDE-SIMETH 200-200-20 MG/5ML PO SUSP: 30.0000 mL | ORAL | Status: DC | PRN

## 2019-05-09 MED ORDER — ACETAMINOPHEN 325 MG PO TABS
650.0000 mg | ORAL_TABLET | Freq: Four times a day (QID) | ORAL | Status: DC | PRN
  Administered 2019-05-10 – 2019-05-16 (×4): 650 mg via ORAL
  Filled 2019-05-09 (×4): qty 2

## 2019-05-09 MED ORDER — GABAPENTIN 300 MG PO CAPS
300.0000 mg | ORAL_CAPSULE | Freq: Three times a day (TID) | ORAL | Status: DC
  Administered 2019-05-09 – 2019-05-16 (×23): 300 mg via ORAL
  Filled 2019-05-09 (×13): qty 1
  Filled 2019-05-09 (×2): qty 21
  Filled 2019-05-09 (×13): qty 1
  Filled 2019-05-09: qty 21
  Filled 2019-05-09: qty 1

## 2019-05-09 NOTE — Progress Notes (Signed)
Pt did not attend wrap up group this evening.  

## 2019-05-09 NOTE — [facility] Suicide Risk Assessment (Addendum)
[facility]   Nursing information obtained from:  Patient Demographic factors:  Male, Caucasian, Living alone Current Mental Status:  Suicidal ideation indicated by patient, Plan includes specific time, place, or method Loss Factors:  Financial problems / change in socioeconomic status Historical Factors:  Prior suicide attempts Risk Reduction Factors:  Positive social support  Total Time spent with patient: 45 minutes Principal Problem:  Alcohol Dependence, Alcohol Induced  Mood Disorder versus MDD Diagnosis:  Active Problems:   Severe recurrent major depression without psychotic features (HCC)  Subjective Data:   Continued Clinical Symptoms:  Alcohol Use Disorder Identification Test Final Score (AUDIT): 37 The "Alcohol Use Disorders Identification Test", Guidelines for Use in Primary Care, Second Edition.  World Science writerHealth Organization [facility](WHO). Score between 0-7:  no or low risk or alcohol related problems. Score between 8-15:  moderate risk of alcohol related problems. Score between 16-19:  high risk of alcohol related problems. Score 20 or above:  warrants further diagnostic evaluation for alcohol dependence and treatment.   CLINICAL FACTORS:  [id number], divorced, was staying at a sober living setting up to recently. Presented to hospital voluntarily reporting depression, suicidal ideations, with no specific plan or intention. Endorses neuro-vegetative symptoms- anhedonia, depression, social isolation, decreased appetite, hypersomnia. Denies psychotic symptoms.  He has a history of alcohol dependence. States he relapsed 2-3 weeks ago, following almost a year of sobriety. Has been drinking daily, heavily. Admission BAL 105, admission UDS positive for cocaine . He reports irregular , occasional cocaine use . Denies history of alcohol seizures  History of mood disorder, states he has been diagnosed with Bipolar Disorder, although at this time endorses history of  depression but no clear history of hypomania or mania. Denies history of suicide attempts. History of short lived auditory hallucinations during periods of alcohol WDL. History of prior psychiatric admissions - [facility], at the time was discharged on Acamprosate, Prozac, Amlodipine , Neurontin, Seroquel. Medical History - Hep C(+), HTN. Also reports he has been diagnosed with Sleep Apnea in the past .Allergic to Lisinopril, PCN. Home medications - Wellbutrin XL 300 mgrs QDAY most recently ( Cymbalta, Prozac in the past ), Neurontin 300 mgrs TID, Trazodone 50 mgrs QHS, Propranolol 40 mgrs BID.  Dx- Alcohol Use Disorder, Alcohol Induced Mood Disorder versus MDD.  Plan- Inpatient admission.   Continue Neurontin 300 mgrs TID for alcohol use disorder, anxiety, pain. Continue Propranolol 40 mgrs BID for HTN Continue Ativan - will switch to standing ( rather than PRN ) detox  Start Protonix for gastric protection/GERD At this time would not restart Wellbutrin XL as may lower seizure threshold. Patient states Prozac was working well for him in the past, without side effects- start Prozac 20 mgrs QDAY .         Musculoskeletal: Strength & Muscle Tone: within normal limits no restlessness or agitation, (+) facial flushing and reports feeling " sweaty". Gait & Station: normal Patient leans: N/A  Psychiatric Specialty Exam: Physical Exam  ROS mild headache, no chest pain, no shortness of breath, no coughing,reports " acid reflux/ sour stomach", no vomiting, no diarrhea  Blood pressure (!) 113/92, pulse 69, temperature 98.4 F (36.9 C), temperature source Oral, resp. rate 20, height 6\' 3"  (1.905 m), weight 127 kg, SpO2 94 %.Body mass index is 35 kg/m.  General Appearance: Fairly Groomed  Eye Contact:  Good  Speech:  Normal Rate  Volume:  Normal  Mood:  vaguely depressed, descibes feeling " really down this time around "  Affect:   constricted, but feeling better  Thought Process:  Linear and Descriptions of Associations: Intact  Orientation:  Other:  fully alert and attentive  Thought Content:  no hallucinations, no delusions at this time, does not appear internally preoccupied   Suicidal Thoughts:  No denies suicidal or self injurious ideations at this time and contracts for safety on unit, also denies any homicidal or violent ideations  Homicidal Thoughts:  No  Memory:  recent and remote grossly intact   Judgement:  Fair  Insight:  Fair  Psychomotor Activity:  no restlessness,no significant tremors, (+) facial flushing  Concentration:  Concentration: Good and Attention Span: Good  Recall:  Good  Fund of Knowledge:  Good  Language:  Good  Akathisia:  Negative  Handed:  Right  AIMS (if indicated):     Assets:  Desire for Improvement Resilience  ADL's:  Intact  Cognition:  WNL  Sleep:         COGNITIVE FEATURES THAT CONTRIBUTE TO RISK:  Closed-mindedness and Loss of executive function    SUICIDE RISK:   Moderate:  Frequent suicidal ideation with limited intensity, and duration, some specificity in terms of plans, no associated intent, good self-control, limited dysphoria/symptomatology, some risk factors present, and identifiable protective factors, including available and accessible social support.  PLAN OF CARE: Patient will be admitted to inpatient psychiatric unit for stabilization and safety. Will provide and encourage milieu participation. Provide medication management and maked adjustments as needed. Will also provide medication management to address alcohol WDL. Will follow daily.    I certify that inpatient services furnished can reasonably be expected to improve the patient's condition.   Jenne Campus, MD 05/09/2019, 2:24 PM

## 2019-05-09 NOTE — ED Notes (Signed)
Voluntary consent faxed to [facility]

## 2019-05-09 NOTE — Progress Notes (Signed)
Patient ID: Steven Daniels, male   DOB: 08/13/1961, 58 y.o.   MRN: [id number]   [place] NOVEL CORONAVIRUS (COVID-19) DAILY CHECK-OFF SYMPTOMS - answer yes or no to each - every day NO YES  Have you had a fever in the past 24 hours?  . Fever (Temp > 37.80C / 100F) X   Have you had any of these symptoms in the past 24 hours? . New Cough .  Sore Throat  .  Shortness of Breath .  Difficulty Breathing .  Unexplained Body Aches   X   Have you had any one of these symptoms in the past 24 hours not related to allergies?   . Runny Nose .  Nasal Congestion .  Sneezing   X   If you have had runny nose, nasal congestion, sneezing in the past 24 hours, has it worsened?  X   EXPOSURES - check yes or no X   Have you traveled outside the state in the past 14 days?  X   Have you been in contact with someone with a confirmed diagnosis of COVID-19 or PUI in the past 14 days without wearing appropriate PPE?  X   Have you been living in the same home as a person with confirmed diagnosis of COVID-19 or a PUI (household contact)?    X   Have you been diagnosed with COVID-19?    X              What to do next: Answered NO to all: Answered YES to anything:   Proceed with unit schedule Follow the [facility] Inpatient Flowsheet.   

## 2019-05-09 NOTE — Progress Notes (Signed)
Patient ID: Yoav Okane, male   DOB: 1961-03-05, 58 y.o.   MRN: [id number]  Nursing Progress Note [id number]  Patient presents pleasant on approach but with complaints of withdrawal. Patient states he didn't sleep last night due to his early morning admission. Patient compliant with scheduled medications and is provided PRNs for withdrawal. Patient currently reports passive SI with no current plan but denies HI/AVH. Patient declined to complete self-inventory sheet.  Patient is educated about and provided medication per provider's orders. Patient safety maintained with q15 min safety checks and high fall risk precautions. Emotional support given, 1:1 interaction, and active listening provided. Patient encouraged to attend meals, groups, and work on treatment plan and goals. Labs, vital signs and patient behavior monitored throughout shift.   Patient contracts for safety with staff. Patient remains safe on the unit at this time and agrees to come to staff with any issues/concerns. Will continue to support and monitor.

## 2019-05-09 NOTE — Tx Team (Signed)
Initial Treatment Plan 05/09/2019 4:57 AM Steven Daniels MBW:[id number]    PATIENT STRESSORS: Financial difficulties Health problems Medication change or noncompliance Substance abuse   PATIENT STRENGTHS: Active sense of humor General fund of knowledge Motivation for treatment/growth   PATIENT IDENTIFIED PROBLEMS: "want to get off of drugs and ETOH"  "depression and wanting to hurt myself"                   DISCHARGE CRITERIA:  Improved stabilization in mood, thinking, and/or behavior Verbal commitment to aftercare and medication compliance Withdrawal symptoms are absent or subacute and managed without 24-hour nursing intervention  PRELIMINARY DISCHARGE PLAN: Attend aftercare/continuing care group Attend 12-step recovery group  PATIENT/FAMILY INVOLVEMENT: This treatment plan has been presented to and reviewed with the patient, Steven Daniels, and/or family member, .  The patient and family have been given the opportunity to ask questions and make suggestions.  Migdalia Dk, RN 05/09/2019, 4:57 AM

## 2019-05-09 NOTE — Tx Team (Signed)
Interdisciplinary Treatment and Diagnostic Plan Update  05/09/2019 Time of Session: 9:00am Steven Daniels MRN: [id number]  Principal Diagnosis: <principal problem not specified>  Secondary Diagnoses: Active Problems:   Severe recurrent major depression without psychotic features (HCC)   Current Medications:  Current Facility-Administered Medications  Medication Dose Route Frequency Provider Last Rate Last Dose  . acetaminophen (TYLENOL) tablet 650 mg  650 mg Oral Q6H PRN Lindon Romp A, NP      . albuterol (VENTOLIN HFA) 108 (90 Base) MCG/ACT inhaler 2 puff  2 puff Inhalation QID PRN Rozetta Nunnery, NP      . alum & mag hydroxide-simeth (MAALOX/MYLANTA) 200-200-20 MG/5ML suspension 30 mL  30 mL Oral Q4H PRN Lindon Romp A, NP      . gabapentin (NEURONTIN) capsule 300 mg  300 mg Oral TID Lindon Romp A, NP   300 mg at 05/09/19 0805  . hydrOXYzine (ATARAX/VISTARIL) tablet 25 mg  25 mg Oral Q6H PRN Lindon Romp A, NP      . loperamide (IMODIUM) capsule 2-4 mg  2-4 mg Oral PRN Lindon Romp A, NP      . LORazepam (ATIVAN) tablet 1 mg  1 mg Oral Q6H PRN Lindon Romp A, NP   1 mg at 05/09/19 0807  . magnesium hydroxide (MILK OF MAGNESIA) suspension 30 mL  30 mL Oral Daily PRN Lindon Romp A, NP      . nicotine (NICODERM CQ - dosed in mg/24 hours) patch 21 mg  21 mg Transdermal Daily Lindon Romp A, NP      . ondansetron (ZOFRAN-ODT) disintegrating tablet 4 mg  4 mg Oral Q6H PRN Lindon Romp A, NP      . propranolol (INDERAL) tablet 40 mg  40 mg Oral BID Lindon Romp A, NP   40 mg at 05/09/19 0805  . [START ON 05/10/2019] thiamine (VITAMIN B-1) tablet 100 mg  100 mg Oral Daily Lindon Romp A, NP      . traZODone (DESYREL) tablet 50 mg  50 mg Oral QHS Lindon Romp A, NP       PTA Medications: Medications Prior to Admission  Medication Sig Dispense Refill Last Dose  . albuterol (PROVENTIL HFA;VENTOLIN HFA) 108 (90 Base) MCG/ACT inhaler Inhale 2 puffs into the lungs 4 (four) times daily as needed  for wheezing or shortness of breath. 1 Inhaler 3 05/08/2019 at Unknown time  . albuterol (PROVENTIL) (2.5 MG/3ML) 0.083% nebulizer solution Take 3 mLs (2.5 mg total) by nebulization 3 (three) times daily. (Patient not taking: Reported on 05/08/2019) 75 mL 12 Not Taking at Unknown time  . alum & mag hydroxide-simeth (MYLANTA) 740-814-48 MG/5ML suspension Take 15-30 mLs by mouth every 6 (six) hours as needed for indigestion or heartburn.   unk at unk  . benzonatate (TESSALON) 100 MG capsule Take 2 capsules (200 mg total) by mouth 3 (three) times daily as needed for cough. (Patient not taking: Reported on 05/08/2019) 45 capsule 0 Not Taking at Unknown time  . buPROPion (WELLBUTRIN XL) 300 MG 24 hr tablet Take 300 mg by mouth daily.   05/07/2019 at am  . cetirizine (ZYRTEC) 10 MG tablet Take 10 mg by mouth daily as needed for allergies.   Not Taking at Unknown time  . Fructose-Dextrose-Phosphor Acd (NAUSEA CONTROL) 1.87-1.87-21.5 SOLN Take 5-10 mLs by mouth daily as needed (nausea).   Not Taking at Unknown time  . gabapentin (NEURONTIN) 300 MG capsule Take 1 capsule (300 mg total) by mouth 3 (three) times daily. (Patient taking  differently: Take 300 mg by mouth See admin instructions. Take 300 mg by mouth two times a day and an additional 300 mg once a day as needed for nerve pain) 90 capsule 0 05/08/2019 at am  . guaiFENesin (MUCINEX) 600 MG 12 hr tablet Take 1 tablet (600 mg total) by mouth 2 (two) times daily. 9-5 Congestion (Patient not taking: Reported on 05/08/2019) 30 tablet 0 Not Taking at Unknown time  . ibuprofen (ADVIL,MOTRIN) 600 MG tablet Take 600 mg by mouth 2 (two) times a day.    05/07/2019 at Unknown time  . loperamide (IMODIUM A-D) 2 MG tablet Take 2 mg by mouth 4 (four) times daily as needed for diarrhea or loose stools.   Not Taking at Unknown time  . Multiple Vitamin-Folic Acid TABS Take 1 tablet by mouth daily with lunch.    05/08/2019 at 1200  . nicotine (NICODERM CQ - DOSED IN MG/24 HOURS) 21 mg/24hr  patch Place 1 patch (21 mg total) onto the skin daily. (Patient not taking: Reported on 05/08/2019) 28 patch 0 Not Taking at Unknown time  . ondansetron (ZOFRAN) 8 MG tablet Take 8 mg by mouth 2 (two) times daily as needed for nausea or vomiting.   Not Taking at Unknown time  . predniSONE (DELTASONE) 10 MG tablet Prednisone dosing: Take  Prednisone '40mg'$  (4 tabs) x 3 days, then taper to '30mg'$  (3 tabs) x 3 days, then '20mg'$  (2 tabs) x 3days, then '10mg'$  (1 tab) x 3days, then OFF.  Dispense:  30 tabs, refills: None (Patient not taking: Reported on 05/08/2019) 30 tablet 0 Not Taking at Unknown time  . propranolol (INDERAL) 80 MG tablet Take 1 tablet (80 mg total) by mouth daily. (Patient taking differently: Take 40 mg by mouth 2 (two) times daily. ) 30 tablet 0 05/08/2019 at am  . Sennosides (LAXATIVE) 25 MG TABS Take 1 tablet by mouth daily as needed (constipation).   Not Taking at Unknown time  . thiamine (VITAMIN B-1) 100 MG tablet Take 100 mg by mouth daily.   Not Taking at Unknown time  . traZODone (DESYREL) 50 MG tablet Take 50 mg by mouth at bedtime.    Past Week at Unknown time  . umeclidinium-vilanterol (ANORO ELLIPTA) 62.5-25 MCG/INH AEPB Inhale 1 puff into the lungs daily. (Patient not taking: Reported on 05/08/2019) 30 each 3 Not Taking at Unknown time    Patient Stressors: Financial difficulties Health problems Medication change or noncompliance Substance abuse  Patient Strengths: Active sense of humor General fund of knowledge Motivation for treatment/growth  Treatment Modalities: Medication Management, Group therapy, Case management,  1 to 1 session with clinician, Psychoeducation, Recreational therapy.   Physician Treatment Plan for Primary Diagnosis: <principal problem not specified> Long Term Goal(s):     Short Term Goals:    Medication Management: Evaluate patient's response, side effects, and tolerance of medication regimen.  Therapeutic Interventions: 1 to 1 sessions, Unit Group  sessions and Medication administration.  Evaluation of Outcomes: Not Met  Physician Treatment Plan for Secondary Diagnosis: Active Problems:   Severe recurrent major depression without psychotic features ([place])  Long Term Goal(s):     Short Term Goals:       Medication Management: Evaluate patient's response, side effects, and tolerance of medication regimen.  Therapeutic Interventions: 1 to 1 sessions, Unit Group sessions and Medication administration.  Evaluation of Outcomes: Not Met   RN Treatment Plan for Primary Diagnosis: <principal problem not specified> Long Term Goal(s): Knowledge of disease and therapeutic  regimen to maintain health will improve  Short Term Goals: Ability to participate in decision making will improve, Ability to identify and develop effective coping behaviors will improve and Compliance with prescribed medications will improve  Medication Management: RN will administer medications as ordered by provider, will assess and evaluate patient's response and provide education to patient for prescribed medication. RN will report any adverse and/or side effects to prescribing provider.  Therapeutic Interventions: 1 on 1 counseling sessions, Psychoeducation, Medication administration, Evaluate responses to treatment, Monitor vital signs and CBGs as ordered, Perform/monitor CIWA, COWS, AIMS and Fall Risk screenings as ordered, Perform wound care treatments as ordered.  Evaluation of Outcomes: Not Met   LCSW Treatment Plan for Primary Diagnosis: <principal problem not specified> Long Term Goal(s): Safe transition to appropriate next level of care at discharge, Engage patient in therapeutic group addressing interpersonal concerns.  Short Term Goals: Engage patient in aftercare planning with referrals and resources, Increase social support, Identify triggers associated with mental health/substance abuse issues and Increase skills for wellness and recovery  Therapeutic  Interventions: Assess for all discharge needs, 1 to 1 time with Social worker, Explore available resources and support systems, Assess for adequacy in community support network, Educate family and significant other(s) on suicide prevention, Complete Psychosocial Assessment, Interpersonal group therapy.  Evaluation of Outcomes: Not Met   Progress in Treatment: Attending groups: No. Participating in groups: No. Taking medication as prescribed: Yes. Toleration medication: Yes. Family/Significant other contact made: No, will contact:  supports if consents are granted Patient understands diagnosis: Yes. Discussing patient identified problems/goals with staff: Yes. Medical problems stabilized or resolved: Yes. Denies suicidal/homicidal ideation: No. Issues/concerns per patient self-inventory: Yes.  New problem(s) identified: No, Describe:  CSW continuing to assess  New Short Term/Long Term Goal(s):  detox, medication management for mood stabilization; elimination of SI thoughts; development of comprehensive mental wellness/sobriety plan.  Patient Goals:    Discharge Plan or Barriers: CSW continuing to assess  Reason for Continuation of Hospitalization: Anxiety Depression Suicidal ideation Withdrawal symptoms  Estimated Length of Stay: 3-5 days  Attendees: Patient: 05/09/2019 9:27 AM  Physician:  05/09/2019 9:27 AM  Nursing:  05/09/2019 9:27 AM  RN Care Manager: 05/09/2019 9:27 AM  Social Worker: Stephanie Acre, [place] 05/09/2019 9:27 AM  Recreational Therapist:  05/09/2019 9:27 AM  Other:  05/09/2019 9:27 AM  Other:  05/09/2019 9:27 AM  Other: 05/09/2019 9:27 AM    Scribe for Treatment Team: Joellen Jersey, [place] 05/09/2019 9:27 AM

## 2019-05-09 NOTE — Progress Notes (Signed)
CSW attempted to meet with patient a second time to complete assessment. CSW knocked on door, called patient's name, he briefly opened his eyes, rolled over and started making loud snoring noises.  Stephanie Acre, LCSW-A Clinical Social Worker

## 2019-05-09 NOTE — H&P (Addendum)
Psychiatric Admission Assessment Adult  Patient Identification: Steven Daniels MRN:  [id number] Date of Evaluation:  05/09/2019 Chief Complaint:  "Worst depression I've ever had." Principal Diagnosis: <principal problem not specified> Diagnosis:  Active Problems:   Severe recurrent major depression without psychotic features ([place])  History of Present Illness: Steven Daniels is a 58 year old male with history of depression, alcohol use disorder, hepatitis C, OSA, and HTN, presenting for treatment of suicidal ideation with alcohol abuse. He reports relapsing on alcohol two weeks ago after a one year period of sobriety, with daily alcohol use (3 40s/day and liquor) since that time. He reports drinking until blackouts frequently. He had been staying in various sober living facilities. Admission Bal 105. UDS also positive for cocaine, although patient denies pattern of regular cocaine use. He reports starting Wellbutrin several weeks ago and reports increased severe depression since that time, with hypersomnia, fatigue, decreased appetite, isolating from others, nightmares, and suicidal ideation with no plan. He states "getting alcohol is the only thing that gets me out of bed." Denies HI/AVH. Denies SI. Reports withdrawal symptoms- diaphoresis, nausea, reflux, headache. Reports history of DTs but denies history of seizures. AST 119, ALT 179.  Associated Signs/Symptoms: Depression Symptoms:  depressed mood, anhedonia, hypersomnia, fatigue, feelings of worthlessness/guilt, suicidal thoughts without plan, decreased appetite, (Hypo) Manic Symptoms:  denies Anxiety Symptoms:  denies Psychotic Symptoms:  denies PTSD Symptoms: Negative Total Time spent with patient: 30 minutes  Past Psychiatric History: Long history of alcohol use disorder with multiple hospitalizations for detox, most recently at [facility] July 2019 and discharged on Prozac, Seroquel, Neurontin, amantadine and propanolol. Denies  history of suicide attempts. No clear history of mania. History of AH while withdrawing from alcohol only.  Is the patient at risk to self? Yes.    Has the patient been a risk to self in the past 6 months? No.  Has the patient been a risk to self within the distant past? Yes.    Is the patient a risk to others? No.  Has the patient been a risk to others in the past 6 months? No.  Has the patient been a risk to others within the distant past? No.   Prior Inpatient Therapy:   Prior Outpatient Therapy:    Alcohol Screening: 1. How often do you have a drink containing alcohol?: 4 or more times a week 2. How many drinks containing alcohol do you have on a typical day when you are drinking?: 7, 8, or 9 3. How often do you have six or more drinks on one occasion?: Daily or almost daily AUDIT-C Score: 11 4. How often during the last year have you found that you were not able to stop drinking once you had started?: Daily or almost daily 5. How often during the last year have you failed to do what was normally expected from you becasue of drinking?: Daily or almost daily 6. How often during the last year have you needed a first drink in the morning to get yourself going after a heavy drinking session?: Daily or almost daily 7. How often during the last year have you had a feeling of guilt of remorse after drinking?: Daily or almost daily 8. How often during the last year have you been unable to remember what happened the night before because you had been drinking?: Daily or almost daily 9. Have you or someone else been injured as a result of your drinking?: Yes, but not in the last year 10. Has a  relative or friend or a doctor or another health worker been concerned about your drinking or suggested you cut down?: Yes, during the last year Alcohol Use Disorder Identification Test Final Score (AUDIT): 37 Alcohol Brief Interventions/Follow-up: Alcohol Education Substance Abuse History in the last 12  months:  Yes.   Consequences of Substance Abuse: Medical Consequences:  hepatitis C Blackouts:  hx blackouts DT's: hx DTs Withdrawal Symptoms:   Headaches Nausea Tremors Previous Psychotropic Medications: Yes  Psychological Evaluations: No  Past Medical History:  Past Medical History:  Diagnosis Date  . Alcohol abuse   . Bipolar affective disorder ([place])   . Depression   . Hard of hearing   . Heart murmur    per pt 12/28/15  . Hepatitis C   . Hypertension   . Seizures ([place])     Past Surgical History:  Procedure Laterality Date  . HIP FRACTURE SURGERY Right    x 3  . KNEE SURGERY Right    Family History:  Family History  Problem Relation Age of Onset  . Other Mother        brain tumor behind left ear  . Alzheimer's disease Mother   . Skin cancer Father        melatomia  . Breast cancer Maternal Grandmother   . Heart disease Paternal Uncle   . Heart disease Cousin        fathers side  . Colon cancer Neg Hx   . Esophageal cancer Neg Hx    Family Psychiatric  History: Denies Tobacco Screening: Have you used any form of tobacco in the last 30 days? (Cigarettes, Smokeless Tobacco, Cigars, and/or Pipes): Yes Tobacco use, Select all that apply: 5 or more cigarettes per day Are you interested in Tobacco Cessation Medications?: No, patient refused Counseled patient on smoking cessation including recognizing danger situations, developing coping skills and basic information about quitting provided: Refused/Declined practical counseling Social History:  Social History   Substance and Sexual Activity  Alcohol Use Yes  . Frequency: Never   Comment: patient states he is in rehab     Social History   Substance and Sexual Activity  Drug Use Not Currently  . Types: Cocaine   Comment: crack, heroin -snort last use 12/27/2017    Additional Social History:      Pain Medications: see MAR Prescriptions: see MAR Over the Counter: see MAR History of alcohol / drug use?:  Yes Longest period of sobriety (when/how long): unsure Negative Consequences of Use: Financial, Personal relationships Withdrawal Symptoms: Sweats, Tremors                    Allergies:   Allergies  Allergen Reactions  . Other Shortness Of Breath    Reaction to cat dander  . Apple Swelling and Other (See Comments)    Raw apples cause gum swelling  . Cherry Swelling and Other (See Comments)    Raw cherries cause gum swelling  . Plum Pulp Swelling and Other (See Comments)    Raw plums cause gum swelling  . Lisinopril Hives and Rash  . Penicillins Rash    Allergic reaction as a 58 year old Has patient had a PCN reaction causing immediate rash, facial/tongue/throat swelling, SOB or lightheadedness with hypotension: Yes Has patient had a PCN reaction causing severe rash involving mucus membranes or skin necrosis: No Has patient had a PCN reaction that required hospitalization: No Has patient had a PCN reaction occurring within the last 10 years: No If  all of the above answers are "NO", then may proceed with Cephalosporin use.   Lab Results:  Results for orders placed or performed during the hospital encounter of 05/08/19 (from the past 48 hour(s))  Comprehensive metabolic panel     Status: Abnormal   Collection Time: 05/08/19  7:46 PM  Result Value Ref Range   Sodium 139 135 - 145 mmol/L   Potassium 3.6 3.5 - 5.1 mmol/L   Chloride 104 98 - 111 mmol/L   CO2 23 22 - 32 mmol/L   Glucose, Bld 123 (H) 70 - 99 mg/dL   BUN 17 6 - 20 mg/dL   Creatinine, Ser 0.98 0.61 - 1.24 mg/dL   Calcium 8.9 8.9 - 10.3 mg/dL   Total Protein 6.6 6.5 - 8.1 g/dL   Albumin 3.6 3.5 - 5.0 g/dL   AST 119 (H) 15 - 41 U/L   ALT 179 (H) 0 - 44 U/L   Alkaline Phosphatase 131 (H) 38 - 126 U/L   Total Bilirubin 0.8 0.3 - 1.2 mg/dL   GFR calc non Af Amer >60 >60 mL/min   GFR calc Af Amer >60 >60 mL/min   Anion gap 12 5 - 15    Comment: Performed at [facility], [address], [place],  [place] [address]  Ethanol     Status: Abnormal   Collection Time: 05/08/19  7:46 PM  Result Value Ref Range   Alcohol, Ethyl (B) 105 (H) <10 mg/dL    Comment: (NOTE) Lowest detectable limit for serum alcohol is 10 mg/dL. For medical purposes only. Performed at [facility], [place] [address], [place], [place] [address]   Salicylate level     Status: None   Collection Time: 05/08/19  7:46 PM  Result Value Ref Range   Salicylate Lvl <4.1 2.8 - 30.0 mg/dL    Comment: Performed at [facility] [address], [place], [place] [address]  Acetaminophen level     Status: Abnormal   Collection Time: 05/08/19  7:46 PM  Result Value Ref Range   Acetaminophen (Tylenol), Serum <10 (L) 10 - 30 ug/mL    Comment: Performed at [facility] [address], [place], [place] [address]  cbc     Status: Abnormal   Collection Time: 05/08/19  7:46 PM  Result Value Ref Range   WBC 10.4 4.0 - 10.5 K/uL   RBC 5.69 4.22 - 5.81 MIL/uL   Hemoglobin 17.1 (H) 13.0 - 17.0 g/dL   HCT 53.1 (H) 39.0 - 52.0 %   MCV 93.3 80.0 - 100.0 fL   MCH 30.1 26.0 - 34.0 pg   MCHC 32.2 30.0 - 36.0 g/dL   RDW 13.2 11.5 - 15.5 %   Platelets 213 150 - 400 K/uL   nRBC 0.0 0.0 - 0.2 %    Comment: Performed at [facility], [place] [address], [place], [place] [address]  Rapid urine drug screen (hospital performed)     Status: Abnormal   Collection Time: 05/08/19  7:50 PM  Result Value Ref Range   Opiates NONE DETECTED NONE DETECTED   Cocaine POSITIVE (A) NONE DETECTED   Benzodiazepines NONE DETECTED NONE DETECTED   Amphetamines NONE DETECTED NONE DETECTED   Tetrahydrocannabinol NONE DETECTED NONE DETECTED   Barbiturates NONE DETECTED NONE DETECTED    Comment: (NOTE) DRUG SCREEN FOR MEDICAL PURPOSES ONLY.  IF CONFIRMATION IS NEEDED FOR ANY PURPOSE, NOTIFY LAB WITHIN 5 DAYS. LOWEST DETECTABLE LIMITS FOR  URINE DRUG SCREEN Drug Class                     Cutoff (ng/mL) Amphetamine and metabolites     1000 Barbiturate and metabolites    200 Benzodiazepine                 256 Tricyclics and metabolites     300 Opiates and metabolites        300 Cocaine and metabolites        300 THC                            50 Performed at [facility], [place] [address] [place], [place] [address]     Blood Alcohol level:  Lab Results  Component Value Date   ETH 105 (H) 05/08/2019   ETH 28 (H) 34/28/7681    Metabolic Disorder Labs:  Lab Results  Component Value Date   HGBA1C 5.6 02/20/2018   MPG 114.02 02/20/2018   MPG 126 12/30/2015   No results found for: PROLACTIN Lab Results  Component Value Date   CHOL 192 02/20/2018   TRIG 142 02/20/2018   HDL 79 02/20/2018   CHOLHDL 2.4 02/20/2018   VLDL 28 02/20/2018   LDLCALC 85 02/20/2018   LDLCALC 124 (H) 12/30/2015    Current Medications: Current Facility-Administered Medications  Medication Dose Route Frequency Provider Last Rate Last Dose  . acetaminophen (TYLENOL) tablet 650 mg  650 mg Oral Q6H PRN Lindon Romp A, NP      . albuterol (VENTOLIN HFA) 108 (90 Base) MCG/ACT inhaler 2 puff  2 puff Inhalation QID PRN Rozetta Nunnery, NP      . alum & mag hydroxide-simeth (MAALOX/MYLANTA) 200-200-20 MG/5ML suspension 30 mL  30 mL Oral Q4H PRN Lindon Romp A, NP      . FLUoxetine (PROZAC) capsule 20 mg  20 mg Oral Daily Amily Depp A, MD      . gabapentin (NEURONTIN) capsule 300 mg  300 mg Oral TID Lindon Romp A, NP   300 mg at 05/09/19 1224  . hydrOXYzine (ATARAX/VISTARIL) tablet 25 mg  25 mg Oral Q6H PRN Lindon Romp A, NP      . loperamide (IMODIUM) capsule 2-4 mg  2-4 mg Oral PRN Shawnette Augello, Myer Peer, MD      . LORazepam (ATIVAN) tablet 1 mg  1 mg Oral Q6H PRN Lilyanah Celestin, Myer Peer, MD      . LORazepam (ATIVAN) tablet 1 mg  1 mg Oral QID Orien Mayhall, Myer Peer, MD       Followed by  . [START ON 05/11/2019] LORazepam (ATIVAN) tablet 1 mg  1 mg Oral TID Loretto Belinsky, Myer Peer, MD       Followed by  . [START ON 05/12/2019] LORazepam (ATIVAN)  tablet 1 mg  1 mg Oral BID Shayaan Parke, Myer Peer, MD       Followed by  . [START ON 05/13/2019] LORazepam (ATIVAN) tablet 1 mg  1 mg Oral Daily Donyell Carrell A, MD      . magnesium hydroxide (MILK OF MAGNESIA) suspension 30 mL  30 mL Oral Daily PRN Lindon Romp A, NP      . multivitamin with minerals tablet 1 tablet  1 tablet Oral Daily Jaeliana Lococo A, MD      . nicotine (NICODERM CQ - dosed in mg/24 hours) patch 21 mg  21 mg Transdermal Daily Rozetta Nunnery, NP      .  ondansetron (ZOFRAN-ODT) disintegrating tablet 4 mg  4 mg Oral Q6H PRN Servando Kyllonen, Myer Peer, MD      . propranolol (INDERAL) tablet 40 mg  40 mg Oral BID Lindon Romp A, NP   40 mg at 05/09/19 0805  . thiamine (B-1) injection 100 mg  100 mg Intramuscular Once Meline Russaw, Myer Peer, MD      . Derrill Memo ON 05/10/2019] thiamine (VITAMIN B-1) tablet 100 mg  100 mg Oral Daily Emojean Gertz, Myer Peer, MD      . traZODone (DESYREL) tablet 50 mg  50 mg Oral QHS PRN Shavana Calder, Myer Peer, MD       PTA Medications: Medications Prior to Admission  Medication Sig Dispense Refill Last Dose  . albuterol (PROVENTIL HFA;VENTOLIN HFA) 108 (90 Base) MCG/ACT inhaler Inhale 2 puffs into the lungs 4 (four) times daily as needed for wheezing or shortness of breath. 1 Inhaler 3 05/08/2019 at Unknown time  . albuterol (PROVENTIL) (2.5 MG/3ML) 0.083% nebulizer solution Take 3 mLs (2.5 mg total) by nebulization 3 (three) times daily. (Patient not taking: Reported on 05/08/2019) 75 mL 12 Not Taking at Unknown time  . alum & mag hydroxide-simeth (MYLANTA) 161-096-04 MG/5ML suspension Take 15-30 mLs by mouth every 6 (six) hours as needed for indigestion or heartburn.   unk at unk  . benzonatate (TESSALON) 100 MG capsule Take 2 capsules (200 mg total) by mouth 3 (three) times daily as needed for cough. (Patient not taking: Reported on 05/08/2019) 45 capsule 0 Not Taking at Unknown time  . buPROPion (WELLBUTRIN XL) 300 MG 24 hr tablet Take 300 mg by mouth daily.   05/07/2019 at am  .  cetirizine (ZYRTEC) 10 MG tablet Take 10 mg by mouth daily as needed for allergies.   Not Taking at Unknown time  . Fructose-Dextrose-Phosphor Acd (NAUSEA CONTROL) 1.87-1.87-21.5 SOLN Take 5-10 mLs by mouth daily as needed (nausea).   Not Taking at Unknown time  . gabapentin (NEURONTIN) 300 MG capsule Take 1 capsule (300 mg total) by mouth 3 (three) times daily. (Patient taking differently: Take 300 mg by mouth See admin instructions. Take 300 mg by mouth two times a day and an additional 300 mg once a day as needed for nerve pain) 90 capsule 0 05/08/2019 at am  . guaiFENesin (MUCINEX) 600 MG 12 hr tablet Take 1 tablet (600 mg total) by mouth 2 (two) times daily. 9-5 Congestion (Patient not taking: Reported on 05/08/2019) 30 tablet 0 Not Taking at Unknown time  . ibuprofen (ADVIL,MOTRIN) 600 MG tablet Take 600 mg by mouth 2 (two) times a day.    05/07/2019 at Unknown time  . loperamide (IMODIUM A-D) 2 MG tablet Take 2 mg by mouth 4 (four) times daily as needed for diarrhea or loose stools.   Not Taking at Unknown time  . Multiple Vitamin-Folic Acid TABS Take 1 tablet by mouth daily with lunch.    05/08/2019 at 1200  . nicotine (NICODERM CQ - DOSED IN MG/24 HOURS) 21 mg/24hr patch Place 1 patch (21 mg total) onto the skin daily. (Patient not taking: Reported on 05/08/2019) 28 patch 0 Not Taking at Unknown time  . ondansetron (ZOFRAN) 8 MG tablet Take 8 mg by mouth 2 (two) times daily as needed for nausea or vomiting.   Not Taking at Unknown time  . predniSONE (DELTASONE) 10 MG tablet Prednisone dosing: Take  Prednisone '40mg'$  (4 tabs) x 3 days, then taper to '30mg'$  (3 tabs) x 3 days, then '20mg'$  (2 tabs) x 3days,  then '10mg'$  (1 tab) x 3days, then OFF.  Dispense:  30 tabs, refills: None (Patient not taking: Reported on 05/08/2019) 30 tablet 0 Not Taking at Unknown time  . propranolol (INDERAL) 80 MG tablet Take 1 tablet (80 mg total) by mouth daily. (Patient taking differently: Take 40 mg by mouth 2 (two) times daily. ) 30  tablet 0 05/08/2019 at am  . Sennosides (LAXATIVE) 25 MG TABS Take 1 tablet by mouth daily as needed (constipation).   Not Taking at Unknown time  . thiamine (VITAMIN B-1) 100 MG tablet Take 100 mg by mouth daily.   Not Taking at Unknown time  . traZODone (DESYREL) 50 MG tablet Take 50 mg by mouth at bedtime.    Past Week at Unknown time  . umeclidinium-vilanterol (ANORO ELLIPTA) 62.5-25 MCG/INH AEPB Inhale 1 puff into the lungs daily. (Patient not taking: Reported on 05/08/2019) 30 each 3 Not Taking at Unknown time    Musculoskeletal: Strength & Muscle Tone: within normal limits Gait & Station: normal Patient leans: N/A  Psychiatric Specialty Exam: Physical Exam  Nursing note and vitals reviewed. Constitutional: He is oriented to person, place, and time. He appears well-developed and well-nourished.  Eyes: EOM are normal.  Cardiovascular: Normal rate.  Respiratory: Effort normal.  Neurological: He is alert and oriented to person, place, and time.    Review of Systems  Constitutional: Positive for diaphoresis and malaise/fatigue. Negative for chills, fever and weight loss.  Respiratory: Negative for cough and shortness of breath.   Cardiovascular: Negative for chest pain.  Gastrointestinal: Positive for heartburn and nausea. Negative for abdominal pain, diarrhea and vomiting.  Neurological: Positive for tremors and headaches. Negative for sensory change.  Psychiatric/Behavioral: Positive for depression, substance abuse and suicidal ideas. Negative for hallucinations. The patient is not nervous/anxious and does not have insomnia.     Blood pressure (!) 113/92, pulse 69, temperature 98.4 F (36.9 C), temperature source Oral, resp. rate 20, height '6\' 3"'$  (1.905 m), weight 127 kg, SpO2 94 %.Body mass index is 35 kg/m.  See MD's admission SRA    Treatment Plan Summary: Daily contact with patient to assess and evaluate symptoms and progress in treatment and Medication management    Inpatient hospitalization.  See MD's admission SRA for medication management.  Patient will participate in the therapeutic group milieu.  Discharge disposition in progress.   Observation Level/Precautions:  15 minute checks  Laboratory:  A1c, hepatic function panel  Psychotherapy:  Group therapy  Medications:  See MAR  Consultations:  PRN  Discharge Concerns:  Safety and stabilization  Estimated LOS: 3-5 days  Other:      Physician Treatment Plan for Primary Diagnosis: <principal problem not specified> Long Term Goal(s): Improvement in symptoms so as ready for discharge  Short Term Goals: Ability to identify changes in lifestyle to reduce recurrence of condition will improve, Ability to verbalize feelings will improve and Ability to disclose and discuss suicidal ideas  Physician Treatment Plan for Secondary Diagnosis: Active Problems:   Severe recurrent major depression without psychotic features ([place])  Long Term Goal(s): Improvement in symptoms so as ready for discharge  Short Term Goals: Ability to demonstrate self-control will improve and Ability to identify and develop effective coping behaviors will improve  I certify that inpatient services furnished can reasonably be expected to improve the patient's condition.    Connye Burkitt, NP 6/10/20203:03 PM  I have discussed case with NP and have met with patient  Agree with NP note and assessment  85, divorced, was staying at a sober living setting up to recently. Presented to hospital voluntarily reporting depression, suicidal ideations, with no specific plan or intention. Endorses neuro-vegetative symptoms- anhedonia, depression, social isolation, decreased appetite, hypersomnia. Denies psychotic symptoms.  He has a history of alcohol dependence. States he relapsed 2-3 weeks ago, following almost a year of sobriety. Has been drinking daily, heavily. Admission BAL 105, admission UDS positive for cocaine . He reports  irregular , occasional cocaine use . Denies history of alcohol seizures  History of mood disorder, states he has been diagnosed with Bipolar Disorder, although at this time endorses history of depression but no clear history of hypomania or mania. Denies history of suicide attempts. History of short lived auditory hallucinations during periods of alcohol WDL. History of prior psychiatric admissions - history of prior admission to [facility] in March 2019 for similar presentation, at the time was discharged on Acamprosate, Prozac, Amlodipine , Neurontin, Seroquel. Medical History - Hep C(+), HTN. Also reports he has been diagnosed with Sleep Apnea in the past .Allergic to Lisinopril, PCN. Home medications - Wellbutrin XL 300 mgrs QDAY most recently ( Cymbalta, Prozac in the past ), Neurontin 300 mgrs TID, Trazodone 50 mgrs QHS, Propranolol 40 mgrs BID.  Dx- Alcohol Use Disorder, Alcohol Induced Mood Disorder versus MDD.  Plan- Inpatient admission.   Continue Neurontin 300 mgrs TID for alcohol use disorder, anxiety, pain. Continue Propranolol 40 mgrs BID for HTN Continue Ativan - will switch to standing ( rather than PRN ) detox  Start Protonix for gastric protection/GERD At this time would not restart Wellbutrin XL as may lower seizure threshold. Patient states Prozac was working well for him in the past, without side effects- start Prozac 20 mgrs QDAY .

## 2019-05-09 NOTE — Progress Notes (Signed)
CSW attempted to meet with patient to complete PSA, patient found sleeping, did not respond to CSW knocking on door or calling name. Chart review indicates patient experienced difficulty sleeping last night.  CSW will follow up.  Stephanie Acre, LCSW-A Clinical Social Worker

## 2019-05-10 LAB — HEMOGLOBIN A1C
Hgb A1c MFr Bld: 6 % — ABNORMAL HIGH (ref 4.8–5.6)
Mean Plasma Glucose: 125.5 mg/dL

## 2019-05-10 LAB — HEPATIC FUNCTION PANEL
ALT: 137 U/L — ABNORMAL HIGH (ref 0–44)
AST: 85 U/L — ABNORMAL HIGH (ref 15–41)
Albumin: 3.7 g/dL (ref 3.5–5.0)
Alkaline Phosphatase: 111 U/L (ref 38–126)
Bilirubin, Direct: 0.2 mg/dL (ref 0.0–0.2)
Indirect Bilirubin: 0.8 mg/dL (ref 0.3–0.9)
Total Bilirubin: 1 mg/dL (ref 0.3–1.2)
Total Protein: 6.8 g/dL (ref 6.5–8.1)

## 2019-05-10 LAB — NOVEL CORONAVIRUS, NAA (HOSP ORDER, SEND-OUT TO REF LAB; TAT 18-24 HRS): SARS-CoV-2, NAA: NOT DETECTED

## 2019-05-10 NOTE — Plan of Care (Signed)
  Problem: Coping: Goal: Coping ability will improve Outcome: Progressing   Problem: Medication: Goal: Compliance with prescribed medication regimen will improve Outcome: Progressing   D: Pt alert and oriented on the unit. Pt engaging with RN staff and other pts. Pt denies SI/HI, A/VH. Pt was compliant with his medications and was pleasant and cooperative. A: Education, support and encouragement provided, q15 minute safety checks remain in effect. Medications administered per MD orders. R: No reactions/side effects to medicine noted. Pt denies any concerns at this time, and verbally contracts for safety. Pt ambulating on the unit with no issues. Pt remains safe on the unit.
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
## 2019-05-10 NOTE — Progress Notes (Addendum)
[facility] MD Progress Note  05/10/2019 4:09 PM Colleen Donahoe  MRN:  [id number] Subjective: Patient reports some improvement compared to admission.  He continues to endorse depression but does acknowledge feeling "a little bit better" today.  He denies suicidal ideations.  Currently does not endorse medication side effects. Objective: I have discussed case with treatment team and have met with patient. 58 year old male, presented to hospital voluntarily for depression, suicidal ideations, neurovegetative symptoms, alcohol dependence.  Reported having relapsed on alcohol 3 weeks prior after a period of about a year of sobriety.  Admission blood alcohol level 105, admission UDS positive for cocaine.  Patient presents alert, polite on approach.  Reports feeling somewhat better but still depressed.  Denies suicidal ideations. Currently withdrawal symptoms appear improved-he is less flushed than yesterday, subtle/mild distal tremors, no diaphoresis, appears calm/comfortable in bed, and in no acute distress.  Blood pressure 146/93, pulse 77.  Currently denies medication side effects.  Limited milieu participation/interaction thus far, mostly in room, polite on approach.  Labs reviewed-AST/ALT remain elevated but have trended down.  Total/direct/indirect bilirubin within normal.  Hemoglobin A1c 6.0  Principal Problem: Alcohol use disorder.  Alcohol induced mood disorder versus MDD Diagnosis: Active Problems:   Severe recurrent major depression without psychotic features ([place])  Total Time spent with patient: 20 minutes  Past Psychiatric History:   Past Medical History:  Past Medical History:  Diagnosis Date  . Alcohol abuse   . Bipolar affective disorder ([place])   . Depression   . Hard of hearing   . Heart murmur    per pt 12/28/15  . Hepatitis C   . Hypertension   . Seizures ([place])     Past Surgical History:  Procedure Laterality Date  . HIP FRACTURE SURGERY Right    x 3  . KNEE SURGERY Right     Family History:  Family History  Problem Relation Age of Onset  . Other Mother        brain tumor behind left ear  . Alzheimer's disease Mother   . Skin cancer Father        melatomia  . Breast cancer Maternal Grandmother   . Heart disease Paternal Uncle   . Heart disease Cousin        fathers side  . Colon cancer Neg Hx   . Esophageal cancer Neg Hx    Family Psychiatric  History:  Social History:  Social History   Substance and Sexual Activity  Alcohol Use Yes  . Frequency: Never   Comment: patient states he is in rehab     Social History   Substance and Sexual Activity  Drug Use Not Currently  . Types: Cocaine   Comment: crack, heroin -snort last use 12/27/2017    Social History   Socioeconomic History  . Marital status: Single    Spouse name: Not on file  . Number of children: 0  . Years of education: Not on file  . Highest education level: Not on file  Occupational History  . Occupation: disabled  Social Needs  . Financial resource strain: Not on file  . Food insecurity    Worry: Not on file    Inability: Not on file  . Transportation needs    Medical: Not on file    Non-medical: Not on file  Tobacco Use  . Smoking status: Current Every Day Smoker    Packs/day: 1.00    Types: Cigarettes  . Smokeless tobacco: Never Used  Substance and Sexual Activity  .  Alcohol use: Yes    Frequency: Never    Comment: patient states he is in rehab  . Drug use: Not Currently    Types: Cocaine    Comment: crack, heroin -snort last use 12/27/2017  . Sexual activity: Not Currently    Birth control/protection: None  Lifestyle  . Physical activity    Days per week: Not on file    Minutes per session: Not on file  . Stress: Not on file  Relationships  . Social Herbalist on phone: Not on file    Gets together: Not on file    Attends religious service: Not on file    Active member of club or organization: Not on file    Attends meetings of clubs or  organizations: Not on file    Relationship status: Not on file  Other Topics Concern  . Not on file  Social History Narrative   Divorced, no children   Veteran of [employer] and a graduate of [employer] college with a degree in radio TV and marketing formally worked in that [place]   Cocaine heroine and alcohol use.  States intention to quit currently started opiates after her first hip surgery   He is a smoker   11/23/2017      Additional Social History:    Pain Medications: see MAR Prescriptions: see MAR Over the Counter: see MAR History of alcohol / drug use?: Yes Longest period of sobriety (when/how long): unsure Negative Consequences of Use: Financial, Personal relationships Withdrawal Symptoms: Sweats, Tremors  Sleep: Fair  Appetite:  Fair  Current Medications: Current Facility-Administered Medications  Medication Dose Route Frequency Provider Last Rate Last Dose  . acetaminophen (TYLENOL) tablet 650 mg  650 mg Oral Q6H PRN Lindon Romp A, NP   650 mg at 05/10/19 1152  . albuterol (VENTOLIN HFA) 108 (90 Base) MCG/ACT inhaler 2 puff  2 puff Inhalation QID PRN Rozetta Nunnery, NP      . alum & mag hydroxide-simeth (MAALOX/MYLANTA) 200-200-20 MG/5ML suspension 30 mL  30 mL Oral Q4H PRN Lindon Romp A, NP      . FLUoxetine (PROZAC) capsule 20 mg  20 mg Oral Daily Mava Suares, Myer Peer, MD   20 mg at 05/10/19 0808  . gabapentin (NEURONTIN) capsule 300 mg  300 mg Oral TID Lindon Romp A, NP   300 mg at 05/10/19 1150  . hydrOXYzine (ATARAX/VISTARIL) tablet 25 mg  25 mg Oral Q6H PRN Lindon Romp A, NP   25 mg at 05/10/19 1152  . loperamide (IMODIUM) capsule 2-4 mg  2-4 mg Oral PRN Esteban Kobashigawa, Myer Peer, MD      . LORazepam (ATIVAN) tablet 1 mg  1 mg Oral Q6H PRN Elyjah Hazan, Myer Peer, MD      . LORazepam (ATIVAN) tablet 1 mg  1 mg Oral QID Fares Ramthun, Myer Peer, MD   1 mg at 05/10/19 1150   Followed by  . [START ON 05/11/2019] LORazepam (ATIVAN) tablet 1 mg  1 mg Oral TID Tayla Panozzo,  Myer Peer, MD       Followed by  . [START ON 05/12/2019] LORazepam (ATIVAN) tablet 1 mg  1 mg Oral BID Willson Lipa, Myer Peer, MD       Followed by  . [START ON 05/13/2019] LORazepam (ATIVAN) tablet 1 mg  1 mg Oral Daily Anjelo Pullman A, MD      . magnesium hydroxide (MILK OF MAGNESIA) suspension 30 mL  30 mL Oral Daily PRN Gwenlyn Found,  Rinaldo Ratel, NP      . multivitamin with minerals tablet 1 tablet  1 tablet Oral Daily Aisia Correira, Myer Peer, MD   1 tablet at 05/10/19 0808  . nicotine (NICODERM CQ - dosed in mg/24 hours) patch 21 mg  21 mg Transdermal Daily Lindon Romp A, NP   21 mg at 05/10/19 0814  . ondansetron (ZOFRAN-ODT) disintegrating tablet 4 mg  4 mg Oral Q6H PRN Tarvaris Puglia, Myer Peer, MD   4 mg at 05/10/19 1152  . pantoprazole (PROTONIX) EC tablet 40 mg  40 mg Oral Daily Lavayah Vita, Myer Peer, MD   40 mg at 05/10/19 0808  . propranolol (INDERAL) tablet 40 mg  40 mg Oral BID Lindon Romp A, NP   40 mg at 05/10/19 0809  . thiamine (B-1) injection 100 mg  100 mg Intramuscular Once Karron Alvizo A, MD      . thiamine (VITAMIN B-1) tablet 100 mg  100 mg Oral Daily Hannibal Skalla, Myer Peer, MD   100 mg at 05/10/19 0808  . traZODone (DESYREL) tablet 50 mg  50 mg Oral QHS PRN Tyrece Vanterpool, Myer Peer, MD   50 mg at 05/09/19 2109    Lab Results:  Results for orders placed or performed during the hospital encounter of 05/09/19 (from the past 48 hour(s))  Hemoglobin A1c     Status: Abnormal   Collection Time: 05/10/19  6:35 AM  Result Value Ref Range   Hgb A1c MFr Bld 6.0 (H) 4.8 - 5.6 %    Comment: (NOTE) Pre diabetes:          5.7%-6.4% Diabetes:              >6.4% Glycemic control for   <7.0% adults with diabetes    Mean Plasma Glucose 125.5 mg/dL    Comment: Performed at [facility], [place] [address] [place], [place] [address]  Hepatic function panel     Status: Abnormal   Collection Time: 05/10/19  6:35 AM  Result Value Ref Range   Total Protein 6.8 6.5 - 8.1 g/dL   Albumin 3.7 3.5 - 5.0 g/dL   AST 85 (H)  15 - 41 U/L   ALT 137 (H) 0 - 44 U/L   Alkaline Phosphatase 111 38 - 126 U/L   Total Bilirubin 1.0 0.3 - 1.2 mg/dL   Bilirubin, Direct 0.2 0.0 - 0.2 mg/dL   Indirect Bilirubin 0.8 0.3 - 0.9 mg/dL    Comment: Performed at [facility], [place] [address], [place], [place] [address]    Blood Alcohol level:  Lab Results  Component Value Date   ETH 105 (H) 05/08/2019   ETH 28 (H) 26/71/2458    Metabolic Disorder Labs: Lab Results  Component Value Date   HGBA1C 6.0 (H) 05/10/2019   MPG 125.5 05/10/2019   MPG 114.02 02/20/2018   No results found for: PROLACTIN Lab Results  Component Value Date   CHOL 192 02/20/2018   TRIG 142 02/20/2018   HDL 79 02/20/2018   CHOLHDL 2.4 02/20/2018   VLDL 28 02/20/2018   LDLCALC 85 02/20/2018   LDLCALC 124 (H) 12/30/2015    Physical Findings: AIMS: Facial and Oral Movements Muscles of Facial Expression: None, normal Lips and Perioral Area: None, normal Jaw: None, normal Tongue: None, normal,Extremity Movements Upper (arms, wrists, hands, fingers): None, normal Lower (legs, knees, ankles, toes): None, normal, Trunk Movements Neck, shoulders, hips: None, normal, Overall Severity Severity of abnormal movements (highest score from questions above): None, normal  Incapacitation due to abnormal movements: None, normal Patient's awareness of abnormal movements (rate only patient's report): No Awareness, Dental Status Current problems with teeth and/or dentures?: No Does patient usually wear dentures?: No  CIWA:  CIWA-Ar Total: 1 COWS:     Musculoskeletal: Strength & Muscle Tone: within normal limits Gait & Station: normal Patient leans: N/A  Psychiatric Specialty Exam: Physical Exam  ROS no chest pain , no shortness of breath at room air, no vomiting, describes some abdominal discomfort which has improved today, no fever, no chills  Blood pressure (!) 146/93, pulse 77, temperature 97.9 F (36.6 C), resp. rate 20, height 6'  3" (1.905 m), weight 127 kg, SpO2 94 %.Body mass index is 35 kg/m.  General Appearance: Fairly Groomed  Eye Contact:  Good  Speech:  Normal Rate  Volume:  Normal  Mood:  Some improvement, less depressed than on admission  Affect:  Vaguely constricted, tends to improve during session, smiles at times appropriately  Thought Process:  Linear and Descriptions of Associations: Intact  Orientation:  Full (Time, Place, and Person)-presents fully alert and attentive and is oriented x3  Thought Content:  Currently not internally preoccupied and does not endorse hallucinations, no delusions are expressed  Suicidal Thoughts:  No denies suicidal or self-injurious ideations at this time.  Denies homicidal or violent ideations  Homicidal Thoughts:  No  Memory:  Recent and remote grossly intact  Judgement:  Fair  Insight:  Fair  Psychomotor Activity:  Normal  Concentration:  Concentration: Good and Attention Span: Good  Recall:  Good  Fund of Knowledge:  Good  Language:  Good  Akathisia:  Negative  Handed:  Right  AIMS (if indicated):     Assets:  Desire for Improvement Resilience  ADL's:  Intact  Cognition:  WNL  Sleep:  Number of Hours: 6.5   Assessment:  58 year old male, presented to hospital voluntarily for depression, suicidal ideations, neurovegetative symptoms, alcohol dependence.  Reported having relapsed on alcohol 3 weeks prior after a period of about a year of sobriety.  Admission blood alcohol level 105, admission UDS positive for cocaine.  Patient reports some improvement compared to admission.  Today denies suicidal ideations.  Presents future oriented and is expressing interest in going to a rehab at discharge.  He currently does not appear to be in any acute distress, without severe/significant symptoms of alcohol withdrawal.(Does have some mild distal tremors, vitals are stable, presents less flushed than yesterday, no diaphoresis, no psychomotor agitation at present).  We  discussed pharmacological options to help address alcohol use disorder such as naltrexone or acamprosate.  Currently not interested in adding this type of medication to regimen.   Treatment Plan Summary: Daily contact with patient to assess and evaluate symptoms and progress in treatment, Medication management, Plan inpatient treatment  and medications as below Encourage group and milieu participation to work on coping skills and symptom reduction Encourage efforts to work on sobriety and relapse prevention Treatment team working on disposition planning options-patient expressing some interest in going to a rehab setting at discharge Ativan detox protocol for alcohol WDL  Neurontin 300 mgrs TID for anxiety, pain, alcohol use disorder  Prozac 20 mgrs QDAY for depression Propranolol 40 mgrs BID for HTN Trazodone 50 mgrs QHS PRN for insomnia Protonix 40 mgrs QDAY for GERD, gastric protection Jenne Campus, MD 05/10/2019, 4:09    PM

## 2019-05-10 NOTE — Progress Notes (Signed)
Patient has been in contact with [facility], there is not a bed on hold for him, he is on their waitlist. If there is a weekend discharge or cancellation, the earliest available screening would be for Tuesday, 06/16.  Patient reports his car is parked at [facility]. At discharge, he will need to get back to his vehicle. He reports he would like to discharge and drive himself to [place] [place] Social Work[facility]

## 2019-05-10 NOTE — Progress Notes (Addendum)
D:  Patient has been intermittently sleeping throughout the evening with minimal interaction in the milieu.  He denies SI/HI/AVH and contracts for safety on the unit.  A:  Medications administered as ordered.  Safety checks q 15 minutes.  Emotional support provided.  R:  Safety maintained on the unit.   [place] NOVEL CORONAVIRUS (COVID-19) DAILY CHECK-OFF SYMPTOMS - answer yes or no to each - every day NO YES  Have you had a fever in the past 24 hours?  . Fever (Temp > 37.80C / 100F) X   Have you had any of these symptoms in the past 24 hours? . New Cough .  Sore Throat  .  Shortness of Breath .  Difficulty Breathing .  Unexplained Body Aches   X   Have you had any one of these symptoms in the past 24 hours not related to allergies?   . Runny Nose .  Nasal Congestion .  Sneezing   X   If you have had runny nose, nasal congestion, sneezing in the past 24 hours, has it worsened?  X   EXPOSURES - check yes or no X   Have you traveled outside the state in the past 14 days?  X   Have you been in contact with someone with a confirmed diagnosis of COVID-19 or PUI in the past 14 days without wearing appropriate PPE?  X   Have you been living in the same home as a person with confirmed diagnosis of COVID-19 or a PUI (household contact)?    X   Have you been diagnosed with COVID-19?    X              What to do next: Answered NO to all: Answered YES to anything:   Proceed with unit schedule Follow the [facility] Inpatient Flowsheet.

## 2019-05-10 NOTE — Progress Notes (Signed)
CSW attempted to meet with patient a second time to complete assessment. Patient declines to participate, citing that his stomach hurts.   Patient reports he plans to discharge to [facility] will follow up.  Stephanie Acre, LCSW-A Clinical Social Worker

## 2019-05-10 NOTE — Progress Notes (Signed)
Patient ID: Steven Daniels, male   DOB: 1961/03/16, 58 y.o.   MRN: [id number]   [place] NOVEL CORONAVIRUS (COVID-19) DAILY CHECK-OFF SYMPTOMS - answer yes or no to each - every day NO YES  Have you had a fever in the past 24 hours?  . Fever (Temp > 37.80C / 100F) X   Have you had any of these symptoms in the past 24 hours? . New Cough .  Sore Throat  .  Shortness of Breath .  Difficulty Breathing .  Unexplained Body Aches   X   Have you had any one of these symptoms in the past 24 hours not related to allergies?   . Runny Nose .  Nasal Congestion .  Sneezing   X   If you have had runny nose, nasal congestion, sneezing in the past 24 hours, has it worsened?  X   EXPOSURES - check yes or no X   Have you traveled outside the state in the past 14 days?  X   Have you been in contact with someone with a confirmed diagnosis of COVID-19 or PUI in the past 14 days without wearing appropriate PPE?  X   Have you been living in the same home as a person with confirmed diagnosis of COVID-19 or a PUI (household contact)?    X   Have you been diagnosed with COVID-19?    X              What to do next: Answered NO to all: Answered YES to anything:   Proceed with unit schedule Follow the [facility] Inpatient Flowsheet.

## 2019-05-11 DIAGNOSIS — F332 Major depressive disorder, recurrent severe without psychotic features: Principal | ICD-10-CM

## 2019-05-11 LAB — GLUCOSE, CAPILLARY
Glucose-Capillary: 132 mg/dL — ABNORMAL HIGH (ref 70–99)
Glucose-Capillary: 146 mg/dL — ABNORMAL HIGH (ref 70–99)

## 2019-05-11 MED ORDER — FLUOXETINE HCL 20 MG PO CAPS
40.0000 mg | ORAL_CAPSULE | Freq: Every day | ORAL | Status: DC
  Administered 2019-05-12 – 2019-05-16 (×5): 40 mg via ORAL
  Filled 2019-05-11 (×6): qty 2
  Filled 2019-05-11: qty 14

## 2019-05-11 NOTE — Progress Notes (Signed)
Psychoeducational Group Note  Date:  05/11/2019 Time:  2238  Group Topic/Focus:  Wrap-Up Group:   The focus of this group is to help patients review their daily goal of treatment and discuss progress on daily workbooks.  Participation Level: Did Not Attend  Participation Quality:  Not Applicable  Affect:  Not Applicable  Cognitive:  Not Applicable  Insight:  Not Applicable  Engagement in Group: Not Applicable  Additional Comments:  The patient did not attend group since he was asleep in his bedroom.   Archie Balboa S 05/11/2019, 10:38 PM

## 2019-05-11 NOTE — Progress Notes (Signed)
D: Patient observed resting in bed. Did get up for snack. Patient states, "I"m doing ok. I am better than I was. I don't have any concerns. No withdrawal." Patient's affect animated, anxious with anxious, depressed mood. Pleasant and cooperative. COVID-19 screen negative, afebrile. Respiratory assessment WDL.  A: Medicated per orders, no prns requested or needed. Patient was asleep at 2200 ativan and med was held. Medication education provided. Level III obs in place for safety. Emotional support offered. Patient encouraged to complete Suicide Safety Plan before discharge. Encouraged to attend and participate in unit programming.  Fall prevention plan in place and reviewed with patient as pt is a high fall risk.   R: Patient verbalizes understanding of POC, falls prevention education. Patient denies SI/HI/AVH and remains safe on level III obs. Will continue to monitor throughout the night.

## 2019-05-11 NOTE — Progress Notes (Addendum)
[facility]  05/11/2019 11:13 AM Steven Daniels  MRN:  [id number] Subjective:  "I haven't come out of it yet."  [name] found resting in bed. He has been participating in therapeutic milieu, interacting with others appropriately. He continues to report withdrawal symptoms- headache, diaphoresis, disturbed sleep, fatigue. Overall he reports physical symptoms are improving. He continues to report depression but states mood overall is improving. Denies SI/HI/AVH. He is hoping for placement in inpatient rehab at discharge.   From admission H&P: [name] is a 58 year old male with history of depression, alcohol use disorder, hepatitis C, OSA, and HTN, presenting for treatment of suicidal ideation with alcohol abuse. He reports relapsing on alcohol two weeks ago after a one year period of sobriety, with daily alcohol use (3 40s/day and liquor) since that time.   Principal Problem: <principal problem not specified> Diagnosis: Active Problems:   Severe recurrent major depression without psychotic features (HCC)  Total Time spent with patient: 15 minutes  Past Psychiatric History: See admission H&P  Past Medical History:  Past Medical History:  Diagnosis Date  . Alcohol abuse   . Bipolar affective disorder (HCC)   . Depression   . Hard of hearing   . Heart murmur    per pt 12/28/15  . Hepatitis C   . Hypertension   . Seizures (HCC)     Past Surgical History:  Procedure Laterality Date  . HIP FRACTURE SURGERY Right    x 3  . KNEE SURGERY Right    Family History:  Family History  Problem Relation Age of Onset  . Other Mother        brain tumor behind left ear  . Alzheimer's disease Mother   . Skin cancer Father        melatomia  . Breast cancer Maternal Grandmother   . Heart disease Paternal Uncle   . Heart disease Cousin        fathers side  . Colon cancer Neg Hx   . Esophageal cancer Neg Hx    Family Psychiatric  History: See admission H&P Social History:   Social History   Substance and Sexual Activity  Alcohol Use Yes  . Frequency: Never   Comment: patient states he is in rehab     Social History   Substance and Sexual Activity  Drug Use Not Currently  . Types: Cocaine   Comment: crack, heroin -snort last use 12/27/2017    Social History   Socioeconomic History  . Marital status: Single    Spouse name: Not on file  . Number of children: 0  . Years of education: Not on file  . Highest education level: Not on file  Occupational History  . Occupation: disabled  Social Needs  . Financial resource strain: Not on file  . Food insecurity    Worry: Not on file    Inability: Not on file  . Transportation needs    Medical: Not on file    Non-medical: Not on file  Tobacco Use  . Smoking status: Current Every Day Smoker    Packs/day: 1.00    Types: Cigarettes  . Smokeless tobacco: Never Used  Substance and Sexual Activity  . Alcohol use: Yes    Frequency: Never    Comment: patient states he is in rehab  . Drug use: Not Currently    Types: Cocaine    Comment: crack, heroin -snort last use 12/27/2017  . Sexual activity: Not Currently    Birth control/protection:  None  Lifestyle  . Physical activity    Days per week: Not on file    Minutes per session: Not on file  . Stress: Not on file  Relationships  . Social Herbalist on phone: Not on file    Gets together: Not on file    Attends religious service: Not on file    Active member of club or organization: Not on file    Attends meetings of clubs or organizations: Not on file    Relationship status: Not on file  Other Topics Concern  . Not on file  Social History Narrative   Divorced, no children   Veteran of [employer] and a graduate of [employer] with a degree in radio TV and marketing formally worked in that [place]   Cocaine heroine and alcohol use.  States intention to quit currently started opiates after her first hip surgery   He is  a smoker   11/23/2017      Additional Social History:    Pain Medications: see MAR Prescriptions: see MAR Over the Counter: see MAR History of alcohol / drug use?: Yes Longest period of sobriety (when/how long): unsure Negative Consequences of Use: Financial, Personal relationships Withdrawal Symptoms: Sweats, Tremors                    Sleep: Fair  Appetite:  Good  Current Medications: Current Facility-Administered Medications  Medication Dose Route Frequency Provider Last Rate Last Dose  . acetaminophen (TYLENOL) tablet 650 mg  650 mg Oral Q6H PRN Lindon Romp A, NP   650 mg at 05/10/19 1152  . albuterol (VENTOLIN HFA) 108 (90 Base) MCG/ACT inhaler 2 puff  2 puff Inhalation QID PRN Rozetta Nunnery, NP      . alum & mag hydroxide-simeth (MAALOX/MYLANTA) 200-200-20 MG/5ML suspension 30 mL  30 mL Oral Q4H PRN Lindon Romp A, NP      . FLUoxetine (PROZAC) capsule 20 mg  20 mg Oral Daily Jonelle Bann, Myer Peer, MD   20 mg at 05/11/19 0902  . gabapentin (NEURONTIN) capsule 300 mg  300 mg Oral TID Lindon Romp A, NP   300 mg at 05/11/19 0901  . hydrOXYzine (ATARAX/VISTARIL) tablet 25 mg  25 mg Oral Q6H PRN Lindon Romp A, NP   25 mg at 05/11/19 2355  . loperamide (IMODIUM) capsule 2-4 mg  2-4 mg Oral PRN Deaven Barron, Myer Peer, MD      . LORazepam (ATIVAN) tablet 1 mg  1 mg Oral Q6H PRN Drayson Dorko, Myer Peer, MD   1 mg at 05/11/19 0213  . LORazepam (ATIVAN) tablet 1 mg  1 mg Oral TID Tzippy Testerman, Myer Peer, MD       Followed by  . [START ON 05/12/2019] LORazepam (ATIVAN) tablet 1 mg  1 mg Oral BID Bryar Dahms, Myer Peer, MD       Followed by  . [START ON 05/13/2019] LORazepam (ATIVAN) tablet 1 mg  1 mg Oral Daily Sanvika Cuttino A, MD      . magnesium hydroxide (MILK OF MAGNESIA) suspension 30 mL  30 mL Oral Daily PRN Lindon Romp A, NP      . multivitamin with minerals tablet 1 tablet  1 tablet Oral Daily Neftali Thurow, Myer Peer, MD   1 tablet at 05/11/19 0902  . nicotine (NICODERM CQ - dosed in mg/24 hours)  patch 21 mg  21 mg Transdermal Daily Lindon Romp A, NP   21 mg at 05/10/19  [id number]  . ondansetron (ZOFRAN-ODT) disintegrating tablet 4 mg  4 mg Oral Q6H PRN Sasha Rogel, Rockey SituFernando A, MD   4 mg at 05/10/19 1152  . pantoprazole (PROTONIX) EC tablet 40 mg  40 mg Oral Daily Kerina Simoneau, Rockey SituFernando A, MD   40 mg at 05/11/19 0901  . propranolol (INDERAL) tablet 40 mg  40 mg Oral BID Nira ConnBerry, Jason A, NP   40 mg at 05/11/19 0901  . thiamine (B-1) injection 100 mg  100 mg Intramuscular Once Dunbar Buras A, MD      . thiamine (VITAMIN B-1) tablet 100 mg  100 mg Oral Daily Sussan Meter, Rockey SituFernando A, MD   100 mg at 05/11/19 0901  . traZODone (DESYREL) tablet 50 mg  50 mg Oral QHS PRN Kamorah Nevils, Rockey SituFernando A, MD   50 mg at 05/09/19 2109    Lab Results:  Results for orders placed or performed during the hospital encounter of 05/09/19 (from the past 48 hour(s))  Hemoglobin A1c     Status: Abnormal   Collection Time: 05/10/19  6:35 AM  Result Value Ref Range   Hgb A1c MFr Bld 6.0 (H) 4.8 - 5.6 %    Comment: (NOTE) Pre diabetes:          5.7%-6.4% Diabetes:              >6.4% Glycemic control for   <7.0% adults with diabetes    Mean Plasma Glucose 125.5 mg/dL    Comment: Performed at [facility], [address] [place], [place] [address]  Hepatic function panel     Status: Abnormal   Collection Time: 05/10/19  6:35 AM  Result Value Ref Range   Total Protein 6.8 6.5 - 8.1 g/dL   Albumin 3.7 3.5 - 5.0 g/dL   AST 85 (H) 15 - 41 U/L   ALT 137 (H) 0 - 44 U/L   Alkaline Phosphatase 111 38 - 126 U/L   Total Bilirubin 1.0 0.3 - 1.2 mg/dL   Bilirubin, Direct 0.2 0.0 - 0.2 mg/dL   Indirect Bilirubin 0.8 0.3 - 0.9 mg/dL    Comment: Performed at [facility], [address] [place], [place] [address]    Blood Alcohol level:  Lab Results  Component Value Date   ETH 105 (H) 05/08/2019   ETH 28 (H) 02/18/2018    Metabolic Disorder Labs: Lab Results  Component Value Date   HGBA1C 6.0 (H) 05/10/2019    MPG 125.5 05/10/2019   MPG 114.02 02/20/2018   No results found for: PROLACTIN Lab Results  Component Value Date   CHOL 192 02/20/2018   TRIG 142 02/20/2018   HDL 79 02/20/2018   CHOLHDL 2.4 02/20/2018   VLDL 28 02/20/2018   LDLCALC 85 02/20/2018   LDLCALC 124 (H) 12/30/2015    Physical Findings: AIMS: Facial and Oral Movements Muscles of Facial Expression: None, normal Lips and Perioral Area: None, normal Jaw: None, normal Tongue: None, normal,Extremity Movements Upper (arms, wrists, hands, fingers): None, normal Lower (legs, knees, ankles, toes): None, normal, Trunk Movements Neck, shoulders, hips: None, normal, Overall Severity Severity of abnormal movements (highest score from questions above): None, normal Incapacitation due to abnormal movements: None, normal Patient's awareness of abnormal movements (rate only patient's report): No Awareness, Dental Status Current problems with teeth and/or dentures?: No Does patient usually wear dentures?: No  CIWA:  CIWA-Ar Total: 10 COWS:     Musculoskeletal: Strength & Muscle Tone: within normal limits Gait & Station: normal Patient leans: N/A  Psychiatric Specialty Exam: Physical Exam  Nursing note and vitals reviewed. Constitutional: He is oriented to person, place, and time. He appears well-developed and well-nourished.  Cardiovascular: Normal rate.  Respiratory: Effort normal.  Neurological: He is alert and oriented to person, place, and time.    Review of Systems  Constitutional: Positive for diaphoresis and malaise/fatigue. Negative for chills and fever.  Respiratory: Negative for cough and shortness of breath.   Cardiovascular: Negative for chest pain.  Gastrointestinal: Negative for diarrhea, nausea and vomiting.  Neurological: Positive for headaches.  Psychiatric/Behavioral: Positive for depression and substance abuse. Negative for hallucinations and suicidal ideas. The patient is not nervous/anxious and does not  have insomnia.     Blood pressure (!) 142/77, pulse 91, temperature 98.6 F (37 C), resp. rate 20, height 6\' 3"  (1.905 m), weight 127 kg, SpO2 97 %.Body mass index is 35 kg/m.  General Appearance: Casual  Eye Contact:  Good  Speech:  Clear and Coherent  Volume:  Normal  Mood:  Depressed  Affect:  Full Range  Thought Process:  Coherent  Orientation:  Full (Time, Place, and Person)  Thought Content:  Logical  Suicidal Thoughts:  denies  Homicidal Thoughts:  denies  Memory:  Immediate;   Fair Recent;   Fair  Judgement:  Fair  Insight:  Fair  Psychomotor Activity:  Normal  Concentration:  Concentration: Fair  Recall:  [employer]  Fund of Knowledge:  Fair  Language:  Good  Akathisia:  No  Handed:  Right  AIMS (if indicated):     Assets:  Communication Skills Desire for Improvement Housing  ADL's:  Intact  Cognition:  WNL  Sleep:  Number of Hours: 5.5     Treatment Plan Summary: Daily contact with patient to assess and evaluate symptoms and progress in treatment and Medication management   Continue inpatient hospitalization.  Increase Prozac to 40 mg PO daily for mood Continue Ativan CIWA protocol for alcohol withdrawal Continue Neurontin 300 mg PO TID for anxiety/chronic pain Continue Vistaril 25 mg Q6HR PRN anxiety Continue propanolol 40 mg PO BID for HTN Continue Protonix 40 mg PO daily for GERD Continue thiamine 100 mg PO daily for supplementation Continue trazodone 50 mg PO QHS PRN insomnia  Patient will participate in the therapeutic group milieu.  Discharge disposition in progress.   Aldean BakerJanet E Sykes, NP 05/11/2019, 11:13 AM   Agree to NP progress note

## 2019-05-11 NOTE — Progress Notes (Signed)
D Pt is observed OOB UAL on the 300 hall today. He wears hospital-issue patient scrubs.He endorses a flat depressed blunted affect. He does make eye contact with this Probation officer. His hands are sweaty , he is tremulous and he says he feels " lousy" today.     A he completed his daily assessment and on this he wrote he denied having SI today and he rated his depression, hopelessness and anxiety " 9/9/0", respectively. He has slept in between his groups but does spend a little time socializing with his peers in the dayroom.     R Safety is in place.

## 2019-05-11 NOTE — Progress Notes (Signed)
[place] NOVEL CORONAVIRUS (COVID-19) DAILY CHECK-OFF SYMPTOMS - answer yes or no to each - every day NO YES  Have you had a fever in the past 24 hours?  . Fever (Temp > 37.80C / 100F) X   Have you had any of these symptoms in the past 24 hours? . New Cough .  Sore Throat  .  Shortness of Breath .  Difficulty Breathing .  Unexplained Body Aches   X   Have you had any one of these symptoms in the past 24 hours not related to allergies?   . Runny Nose .  Nasal Congestion .  Sneezing   X   If you have had runny nose, nasal congestion, sneezing in the past 24 hours, has it worsened?  X   EXPOSURES - check yes or no X   Have you traveled outside the state in the past 14 days?  X   Have you been in contact with someone with a confirmed diagnosis of COVID-19 or PUI in the past 14 days without wearing appropriate PPE?  X   Have you been living in the same home as a person with confirmed diagnosis of COVID-19 or a PUI (household contact)?    X   Have you been diagnosed with COVID-19?    X              What to do next: Answered NO to all: Answered YES to anything:   Proceed with unit schedule Follow the [facility] Inpatient Flowsheet.   

## 2019-05-12 MED ORDER — LORAZEPAM 1 MG PO TABS
1.0000 mg | ORAL_TABLET | Freq: Four times a day (QID) | ORAL | Status: DC | PRN
  Administered 2019-05-12 – 2019-05-15 (×4): 1 mg via ORAL
  Filled 2019-05-12 (×2): qty 1

## 2019-05-12 NOTE — Progress Notes (Signed)
[facility] MD Progress Note  05/12/2019 11:11 AM Caeden Foots  MRN:  [id number] Subjective: Patient reports some improvement although he states he feels he still experiencing some withdrawal symptoms. Denies suicidal ideations, does not endorse medication side effects at this time.   Objective : I have met with patient and have reviewed chart notes. 58 year old male, presented to hospital voluntarily for depression, suicidal ideations, neurovegetative symptoms, alcohol dependence.  Reported having relapsed on alcohol 3 weeks prior after a period of about a year of sobriety.  Admission blood alcohol level 105, admission UDS positive for cocaine.  Today patient presents alert, attentive, polite, pleasant on approach.  Of note he describes a history of hyperacusia and often requires for writer to use loud voice to understand what is being said.  Acknowledges some improvement compared to admission, but states that he continues to experience vague symptoms of alcohol withdrawal, such as feeling jittery, subjectively tremulous.  Of note, nurses report that patient is noted to talk to himself at times.  Currently patient does not appear internally preoccupied or psychotic.  He does acknowledge talking to self.  He also reports history of auditory hallucinations during periods of withdrawal, but  currently does not endorse active psychotic symptoms at this time Currently appears calm and in no acute distress.  No significant distal tremors are noted.  No diaphoresis is noted. Blood pressure 157/94, pulse 72, temperature 98.1  Labs reviewed-6/11 hemoglobin A1c 6.0, most recent CBG 146.       Principal Problem:  Alcohol Use Disorder, alcohol induced mood disorder versus MDD  Diagnosis: Active Problems:   Severe recurrent major depression without psychotic features ([place])  Total Time spent with patient: 15 minutes  Past Psychiatric History: See admission H&P  Past Medical History:  Past Medical History:   Diagnosis Date  . Alcohol abuse   . Bipolar affective disorder ([place])   . Depression   . Hard of hearing   . Heart murmur    per pt 12/28/15  . Hepatitis C   . Hypertension   . Seizures ([place])     Past Surgical History:  Procedure Laterality Date  . HIP FRACTURE SURGERY Right    x 3  . KNEE SURGERY Right    Family History:  Family History  Problem Relation Age of Onset  . Other Mother        brain tumor behind left ear  . Alzheimer's disease Mother   . Skin cancer Father        melatomia  . Breast cancer Maternal Grandmother   . Heart disease Paternal Uncle   . Heart disease Cousin        fathers side  . Colon cancer Neg Hx   . Esophageal cancer Neg Hx    Family Psychiatric  History: See admission H&P Social History:  Social History   Substance and Sexual Activity  Alcohol Use Yes  . Frequency: Never   Comment: patient states he is in rehab     Social History   Substance and Sexual Activity  Drug Use Not Currently  . Types: Cocaine   Comment: crack, heroin -snort last use 12/27/2017    Social History   Socioeconomic History  . Marital status: Single    Spouse name: Not on file  . Number of children: 0  . Years of education: Not on file  . Highest education level: Not on file  Occupational History  . Occupation: disabled  Social Needs  . Financial resource strain: Not  on file  . Food insecurity    Worry: Not on file    Inability: Not on file  . Transportation needs    Medical: Not on file    Non-medical: Not on file  Tobacco Use  . Smoking status: Current Every Day Smoker    Packs/day: 1.00    Types: Cigarettes  . Smokeless tobacco: Never Used  Substance and Sexual Activity  . Alcohol use: Yes    Frequency: Never    Comment: patient states he is in rehab  . Drug use: Not Currently    Types: Cocaine    Comment: crack, heroin -snort last use 12/27/2017  . Sexual activity: Not Currently    Birth control/protection: None  Lifestyle  . Physical  activity    Days per week: Not on file    Minutes per session: Not on file  . Stress: Not on file  Relationships  . Social Herbalist on phone: Not on file    Gets together: Not on file    Attends religious service: Not on file    Active member of club or organization: Not on file    Attends meetings of clubs or organizations: Not on file    Relationship status: Not on file  Other Topics Concern  . Not on file  Social History Narrative   Divorced, no children   Veteran of [employer] and a graduate of [employer] college with a degree in radio TV and marketing formally worked in that [place]   Cocaine heroine and alcohol use.  States intention to quit currently started opiates after her first hip surgery   He is a smoker   11/23/2017      Additional Social History:    Pain Medications: see MAR Prescriptions: see MAR Over the Counter: see MAR History of alcohol / drug use?: Yes Longest period of sobriety (when/how long): unsure Negative Consequences of Use: Financial, Personal relationships Withdrawal Symptoms: Sweats, Tremors  Sleep: Fair  Appetite:  Good  Current Medications: Current Facility-Administered Medications  Medication Dose Route Frequency Provider Last Rate Last Dose  . acetaminophen (TYLENOL) tablet 650 mg  650 mg Oral Q6H PRN Lindon Romp A, NP   650 mg at 05/10/19 1152  . albuterol (VENTOLIN HFA) 108 (90 Base) MCG/ACT inhaler 2 puff  2 puff Inhalation QID PRN Lindon Romp A, NP      . alum & mag hydroxide-simeth (MAALOX/MYLANTA) 200-200-20 MG/5ML suspension 30 mL  30 mL Oral Q4H PRN Lindon Romp A, NP      . FLUoxetine (PROZAC) capsule 40 mg  40 mg Oral Daily Connye Burkitt, NP   40 mg at 05/12/19 0930  . gabapentin (NEURONTIN) capsule 300 mg  300 mg Oral TID Lindon Romp A, NP   300 mg at 05/12/19 0929  . loperamide (IMODIUM) capsule 2-4 mg  2-4 mg Oral PRN Cobos, Myer Peer, MD      . LORazepam (ATIVAN) tablet 1 mg  1 mg Oral Q6H PRN  Cobos, Myer Peer, MD   1 mg at 05/12/19 3244  . LORazepam (ATIVAN) tablet 1 mg  1 mg Oral BID Cobos, Myer Peer, MD   1 mg at 05/12/19 0932   Followed by  . [START ON 05/13/2019] LORazepam (ATIVAN) tablet 1 mg  1 mg Oral Daily Cobos, Fernando A, MD      . magnesium hydroxide (MILK OF MAGNESIA) suspension 30 mL  30 mL Oral Daily PRN Rozetta Nunnery, NP      .  multivitamin with minerals tablet 1 tablet  1 tablet Oral Daily Cobos, Myer Peer, MD   1 tablet at 05/12/19 0930  . nicotine (NICODERM CQ - dosed in mg/24 hours) patch 21 mg  21 mg Transdermal Daily Lindon Romp A, NP   21 mg at 05/12/19 0930  . ondansetron (ZOFRAN-ODT) disintegrating tablet 4 mg  4 mg Oral Q6H PRN Cobos, Myer Peer, MD   4 mg at 05/10/19 1152  . pantoprazole (PROTONIX) EC tablet 40 mg  40 mg Oral Daily Cobos, Myer Peer, MD   40 mg at 05/12/19 0930  . propranolol (INDERAL) tablet 40 mg  40 mg Oral BID Lindon Romp A, NP   40 mg at 05/12/19 0929  . thiamine (B-1) injection 100 mg  100 mg Intramuscular Once Cobos, Fernando A, MD      . thiamine (VITAMIN B-1) tablet 100 mg  100 mg Oral Daily Cobos, Myer Peer, MD   100 mg at 05/12/19 0929  . traZODone (DESYREL) tablet 50 mg  50 mg Oral QHS PRN Cobos, Myer Peer, MD   50 mg at 05/09/19 2109    Lab Results:  Results for orders placed or performed during the hospital encounter of 05/09/19 (from the past 48 hour(s))  Glucose, capillary     Status: Abnormal   Collection Time: 05/11/19 11:59 AM  Result Value Ref Range   Glucose-Capillary 132 (H) 70 - 99 mg/dL  Glucose, capillary     Status: Abnormal   Collection Time: 05/11/19  4:55 PM  Result Value Ref Range   Glucose-Capillary 146 (H) 70 - 99 mg/dL   Comment 1 Notify RN     Blood Alcohol level:  Lab Results  Component Value Date   ETH 105 (H) 05/08/2019   ETH 28 (H) 06/09/1974    Metabolic Disorder Labs: Lab Results  Component Value Date   HGBA1C 6.0 (H) 05/10/2019   MPG 125.5 05/10/2019   MPG 114.02 02/20/2018    No results found for: PROLACTIN Lab Results  Component Value Date   CHOL 192 02/20/2018   TRIG 142 02/20/2018   HDL 79 02/20/2018   CHOLHDL 2.4 02/20/2018   VLDL 28 02/20/2018   LDLCALC 85 02/20/2018   LDLCALC 124 (H) 12/30/2015    Physical Findings: AIMS: Facial and Oral Movements Muscles of Facial Expression: None, normal Lips and Perioral Area: None, normal Jaw: None, normal Tongue: None, normal,Extremity Movements Upper (arms, wrists, hands, fingers): None, normal Lower (legs, knees, ankles, toes): None, normal, Trunk Movements Neck, shoulders, hips: None, normal, Overall Severity Severity of abnormal movements (highest score from questions above): None, normal Incapacitation due to abnormal movements: None, normal Patient's awareness of abnormal movements (rate only patient's report): No Awareness, Dental Status Current problems with teeth and/or dentures?: No Does patient usually wear dentures?: No  CIWA:  CIWA-Ar Total: 10 COWS:     Musculoskeletal: Strength & Muscle Tone: within normal limits Gait & Station: normal Patient leans: N/A  Psychiatric Specialty Exam: Physical Exam  Nursing note and vitals reviewed. Constitutional: He is oriented to person, place, and time. He appears well-developed and well-nourished.  Cardiovascular: Normal rate.  Respiratory: Effort normal.  Neurological: He is alert and oriented to person, place, and time.    Review of Systems  Constitutional: Positive for diaphoresis and malaise/fatigue. Negative for chills and fever.  Respiratory: Negative for cough and shortness of breath.   Cardiovascular: Negative for chest pain.  Gastrointestinal: Negative for diarrhea, nausea and vomiting.  Neurological: Positive for headaches.  Psychiatric/Behavioral: Positive for depression and substance abuse. Negative for hallucinations and suicidal ideas. The patient is not nervous/anxious and does not have insomnia.   Denies headache, denies  chest pain or shortness of breath, no vomiting, no fever, no chills  Blood pressure (!) 120/107, pulse 79, temperature 98.1 F (36.7 C), temperature source Oral, resp. rate 20, height 6' 3" (1.905 m), weight 127 kg, SpO2 97 %.Body mass index is 35 kg/m.  General Appearance: Fairly Groomed  Eye Contact:  Improving  Speech:  Normal Rate  Volume:  Normal  Mood:  Improving mood  Affect:  Smiles at times appropriately  Thought Process:  Linear and Descriptions of Associations: Intact  Orientation:  Other:  Presents fully alert and attentive  Thought Content:  Endorses intermittent auditory hallucinations, currently not internally preoccupied, no active psychotic symptoms endorsed or noted at this time.  No delusions expressed  Suicidal Thoughts:  denies-denies suicidal or self-injurious ideations  Homicidal Thoughts:  denies  Memory:  Recent and remote grossly intact  Judgement:  Fair  Insight:  Fair  Psychomotor Activity:  Normal-does not appear restless or in any acute distress, appears comfortable  Concentration:  Concentration: Good and Attention Span: Good  Recall:  Good  Fund of Knowledge:  Good  Language:  Good  Akathisia:  No  Handed:  Right  AIMS (if indicated):     Assets:  Communication Skills Desire for Improvement Housing  ADL's:  Intact  Cognition:  WNL  Sleep:  Number of Hours: 3.75   Assessment:  58 year old male, presented to hospital voluntarily for depression, suicidal ideations, neurovegetative symptoms, alcohol dependence.  Reported having relapsed on alcohol 3 weeks prior after a period of about a year of sobriety.  Admission blood alcohol level 105, admission UDS positive for cocaine.  Currently patient presents with improvement.  Endorses some ongoing subjective symptoms of withdrawal such as feeling "jittery" but appears calm, in no acute distress, without significant tremors, diaphoresis, flushing, restlessness.  He has endorsed history of intermittent  auditory hallucinations, particularly during periods of withdrawal but is currently not presenting with active psychotic symptoms.   Treatment Plan Summary: Daily contact with patient to assess and evaluate symptoms and progress in treatment and Medication management  Treatment plan reviewed as below today 6/13 Encourage group and milieu participation to work on coping skills and symptom reduction Encourage efforts to work on sobriety and relapse prevention Continue Prozac  40 mg PO daily for mood Continue Ativan for alcohol withdrawal symptoms as needed per CIWA score Continue Neurontin 300 mg PO TID for anxiety/chronic pain Continue Vistaril 25 mg Q6HR PRN anxiety Continue propanolol 40 mg PO BID for HTN Continue Protonix 40 mg PO daily for GERD Continue thiamine 100 mg PO daily for supplementation Continue Trazodone 50 mg PO QHS PRN insomnia Treatment team working on disposition planning options  Jenne Campus, MD 05/12/2019, 11:11 AM   Patient ID: [name], male   DOB: 08-Nov-1961, 58 y.o.   MRN: [id number]

## 2019-05-12 NOTE — Plan of Care (Signed)
°  Problem: Education: °Goal: Ability to make informed decisions regarding treatment will improve °Outcome: Progressing °  °Problem: Coping: °Goal: Coping ability will improve °Outcome: Progressing °  °

## 2019-05-12 NOTE — Progress Notes (Signed)
Psychoeducational Group Note  Date:  05/12/2019 Time:  2138  Group Topic/Focus:  Wrap-Up Group:   The focus of this group is to help patients review their daily goal of treatment and discuss progress on daily workbooks.  Participation Level: Did Not Attend  Participation Quality:  Not Applicable  Affect:  Not Applicable  Cognitive:  Not Applicable  Insight:  Not Applicable  Engagement in Group: Not Applicable  Additional Comments:  The patient did not attend group this evening since he was asleep in his bedroom.   Archie Balboa S 05/12/2019, 9:38 PM

## 2019-05-12 NOTE — Progress Notes (Signed)
Adult Psychoeducational Group Note  Date:  05/12/2019 Time:  1300  Group Topic/Focus:  Identifying Needs:   The focus of this group is to help patients identify their personal needs that have been historically problematic and identify healthy behaviors to address their needs.  Participation Level:  Did not attend  Participation Quality:     Affect:    Cognitive:    Insight:   Engagement in Group:    Modes of Intervention:    Additional Comments:    Lauralyn Primes 05/12/2019, 4:56 PM

## 2019-05-12 NOTE — Progress Notes (Signed)
D: Patient observed asleep all evening however has been awake now for the last 60-90 mins. Patient can be overheard talking loudly to self in room. Patient has now awakened roommate. Patient's affect animated, mood pleasant. He is alert and oriented x 4.  Denies pain, physical complaints. COVID-19 screen negative, afebrile. Respiratory assessment WDL.  A: Medicated per orders, prn vistaril given for anxiety. Medication education provided. Level III obs in place for safety. Emotional support offered. Patient encouraged to complete Suicide Safety Plan before discharge. Encouraged to attend and participate in unit programming.  Fall prevention plan in place and reviewed with patient as pt is a high fall risk.   R: Patient verbalizes some understanding of POC, falls prevention education. On reassess, patient is still awake and talking. Patient denies SI/HI/AVH and remains safe on level III obs. Will continue to monitor throughout the night.

## 2019-05-12 NOTE — Progress Notes (Signed)
D.  Pt pleasant on approach, states still feeling like he is withdrawing.  Pt did not feel well enough to attend evening wrap up group.  MHT states that patient talks to self aloud in room most of night, and Pt was in fact doing this when this staff member entered his room.  Unclear if it was talking in sleep or not.  Pt responded quickly when spoken to.  Pt denies SI/HI/AVH at this time.  A.  Support and encouragement offered, medication given as ordered  R.  Pt remains safe on the unit, will continue to monitor.

## 2019-05-12 NOTE — Progress Notes (Signed)
[place] NOVEL CORONAVIRUS (COVID-19) DAILY CHECK-OFF SYMPTOMS - answer yes or no to each - every day NO YES  Have you had a fever in the past 24 hours?  . Fever (Temp > 37.80C / 100F) X   Have you had any of these symptoms in the past 24 hours? . New Cough .  Sore Throat  .  Shortness of Breath .  Difficulty Breathing .  Unexplained Body Aches   X   Have you had any one of these symptoms in the past 24 hours not related to allergies?   . Runny Nose .  Nasal Congestion .  Sneezing   X   If you have had runny nose, nasal congestion, sneezing in the past 24 hours, has it worsened?  X   EXPOSURES - check yes or no X   Have you traveled outside the state in the past 14 days?  X   Have you been in contact with someone with a confirmed diagnosis of COVID-19 or PUI in the past 14 days without wearing appropriate PPE?  X   Have you been living in the same home as a person with confirmed diagnosis of COVID-19 or a PUI (household contact)?    X   Have you been diagnosed with COVID-19?    X              What to do next: Answered NO to all: Answered YES to anything:   Proceed with unit schedule Follow the [facility] Inpatient Flowsheet.   

## 2019-05-12 NOTE — [facility] Suicide Risk Assessment (Signed)
[place] INPATIENT:  Family/Significant Other Suicide Prevention Education  Suicide Prevention Education:  Education Completed; sister, Threasa Heads [phone],  (name of family member/significant other) has been identified by the patient as the family member/significant other with whom the patient will be residing, and identified as the person(s) who will aid the patient in the event of a mental health crisis (suicidal ideations/suicide attempt).  With written consent from the patient, the family member/significant other has been provided the following suicide prevention education, prior to the and/or following the discharge of the patient.  Sister states that patient was put on a medication (Wellbutrin) some time ago that was very sedating for him, and he could not see a future for himself while on that medicine.  For about a week prior to hospitalization, she had to "talk him down" from his anger and try to give him reason to hope.  Two of his friends/supports died in the last couple of weeks, making it even worse because there was nobody for him to turn to with his feelings.  Sister states, "In January we almost lost him -- he coded.  He can't drink, it's a death sentence for him."   Sister thinks patient's alcohol use over the last week was a suicide attempt.  She also suspects he would try to contract COVID-19 in order to die.  She was very grateful for this phone call, because he had called her from the [facility] parking lot, but never told he was admitted.  The following information was reviewed in detail with sister, as she stated, "I'm so scared of doing the wrong thing."  The suicide prevention education provided includes the following:  Suicide risk factors  Suicide prevention and interventions  National Suicide Hotline telephone number  [facility] assessment telephone number  [facility] Emergency Assistance [place] and/or Residential Mobile Crisis Unit telephone  number  Request made of family/significant other to:  Remove weapons (e.g., guns, rifles, knives), all items previously/currently identified as safety concern.    Remove drugs/medications (over-the-counter, prescriptions, illicit drugs), all items previously/currently identified as a safety concern.  The family member/significant other verbalizes understanding of the suicide prevention education information provided.  The family member/significant other agrees to remove the items of safety concern listed above.  Berlin Hun Grossman-Orr 05/12/2019, 4:28 PM

## 2019-05-12 NOTE — [facility] Group Notes (Signed)
LCSW Group Therapy Note  05/12/2019   10:00-11:00am   Type of Therapy and Topic:  Group Therapy: Anger Cues and Responses  Participation Level:  Did Not Attend   Description of Group:   In this group, patients learned how to recognize the physical, cognitive, emotional, and behavioral responses they have to anger-provoking situations.  They identified a recent time they became angry and how they reacted.  They analyzed how their reaction was possibly beneficial and how it was possibly unhelpful.  The group discussed a variety of healthier coping skills that could help with such a situation in the future.  Deep breathing was practiced briefly.  Therapeutic Goals: 1. Patients will remember their last incident of anger and how they felt emotionally and physically, what their thoughts were at the time, and how they behaved. 2. Patients will identify how their behavior at that time worked for them, as well as how it worked against them. 3. Patients will explore possible new behaviors to use in future anger situations. 4. Patients will learn that anger itself is normal and cannot be eliminated, and that healthier reactions can assist with resolving conflict rather than worsening situations.  Summary of Patient Progress:  Did not attend  Therapeutic Modalities:   Cognitive Behavioral Therapy  Meira Wahba D Alylah Blakney    

## 2019-05-13 NOTE — Progress Notes (Signed)
Pt has been lying in bed sleeping since this shift began at 1900. He can be heard out in the hallway, talking in his sleep. Regular/even/respirations and no acute distress observed.

## 2019-05-13 NOTE — Progress Notes (Signed)
[facility] MD Progress Note  05/13/2019 2:18 PM Steven Daniels  MRN:  [id number] Subjective: Reports "I still feel good".  He does acknowledge partial improvement compared to admission. Currently does not endorse medication side effects.  Denies suicidal ideations.  Objective : I have met with patient and have reviewed chart notes. 58 year old male, presented to hospital voluntarily for depression, suicidal ideations, neurovegetative symptoms, alcohol dependence.  Reported having relapsed on alcohol 3 weeks prior after a period of about a year of sobriety.  Admission blood alcohol level 105, admission UDS positive for cocaine.  Patient continues to report feeling vaguely depressed and describes neurovegetative symptoms such as low energy, low motivation.  He denies suicidal ideations.  He does present future oriented and inquires about projected discharge date and disposition options. Currently does not present with symptoms of alcohol withdrawal and does not appear to be in any acute distress or discomfort.  No tremors, no diaphoresis, vitals are currently stable. Does not endorse medication side effects. Limited group participation, spending most time in his room.  No psychomotor agitation or restlessness.       Principal Problem:  Alcohol Use Disorder, alcohol induced mood disorder versus MDD  Diagnosis: Active Problems:   Severe recurrent major depression without psychotic features ([place])  Total Time spent with patient: 15 minutes  Past Psychiatric History: See admission H&P  Past Medical History:  Past Medical History:  Diagnosis Date  . Alcohol abuse   . Bipolar affective disorder ([place])   . Depression   . Hard of hearing   . Heart murmur    per pt 12/28/15  . Hepatitis C   . Hypertension   . Seizures ([place])     Past Surgical History:  Procedure Laterality Date  . HIP FRACTURE SURGERY Right    x 3  . KNEE SURGERY Right    Family History:  Family History  Problem Relation Age  of Onset  . Other Mother        brain tumor behind left ear  . Alzheimer's disease Mother   . Skin cancer Father        melatomia  . Breast cancer Maternal Grandmother   . Heart disease Paternal Uncle   . Heart disease Cousin        fathers side  . Colon cancer Neg Hx   . Esophageal cancer Neg Hx    Family Psychiatric  History: See admission H&P Social History:  Social History   Substance and Sexual Activity  Alcohol Use Yes  . Frequency: Never   Comment: patient states he is in rehab     Social History   Substance and Sexual Activity  Drug Use Not Currently  . Types: Cocaine   Comment: crack, heroin -snort last use 12/27/2017    Social History   Socioeconomic History  . Marital status: Single    Spouse name: Not on file  . Number of children: 0  . Years of education: Not on file  . Highest education level: Not on file  Occupational History  . Occupation: disabled  Social Needs  . Financial resource strain: Not on file  . Food insecurity    Worry: Not on file    Inability: Not on file  . Transportation needs    Medical: Not on file    Non-medical: Not on file  Tobacco Use  . Smoking status: Current Every Day Smoker    Packs/day: 1.00    Types: Cigarettes  . Smokeless tobacco: Never Used  Substance and Sexual Activity  . Alcohol use: Yes    Frequency: Never    Comment: patient states he is in rehab  . Drug use: Not Currently    Types: Cocaine    Comment: crack, heroin -snort last use 12/27/2017  . Sexual activity: Not Currently    Birth control/protection: None  Lifestyle  . Physical activity    Days per week: Not on file    Minutes per session: Not on file  . Stress: Not on file  Relationships  . Social Herbalist on phone: Not on file    Gets together: Not on file    Attends religious service: Not on file    Active member of club or organization: Not on file    Attends meetings of clubs or organizations: Not on file    Relationship  status: Not on file  Other Topics Concern  . Not on file  Social History Narrative   Divorced, no children   Veteran of [place] [employer] and a graduate of [employer] with a degree in radio TV and marketing formally worked in that [place]   Cocaine heroine and alcohol use.  States intention to quit currently started opiates after her first hip surgery   He is a smoker   11/23/2017      Additional Social History:    Pain Medications: see MAR Prescriptions: see MAR Over the Counter: see MAR History of alcohol / drug use?: Yes Longest period of sobriety (when/how long): unsure Negative Consequences of Use: Financial, Personal relationships Withdrawal Symptoms: Sweats, Tremors  Sleep: Fair  Appetite:  Good  Current Medications: Current Facility-Administered Medications  Medication Dose Route Frequency Provider Last Rate Last Dose  . acetaminophen (TYLENOL) tablet 650 mg  650 mg Oral Q6H PRN Lindon Romp A, NP   650 mg at 05/10/19 1152  . albuterol (VENTOLIN HFA) 108 (90 Base) MCG/ACT inhaler 2 puff  2 puff Inhalation QID PRN Lindon Romp A, NP      . alum & mag hydroxide-simeth (MAALOX/MYLANTA) 200-200-20 MG/5ML suspension 30 mL  30 mL Oral Q4H PRN Lindon Romp A, NP      . FLUoxetine (PROZAC) capsule 40 mg  40 mg Oral Daily Connye Burkitt, NP   40 mg at 05/13/19 0818  . gabapentin (NEURONTIN) capsule 300 mg  300 mg Oral TID Lindon Romp A, NP   300 mg at 05/13/19 1240  . LORazepam (ATIVAN) tablet 1 mg  1 mg Oral Q6H PRN Daris Aristizabal, Myer Peer, MD   1 mg at 05/12/19 2322  . magnesium hydroxide (MILK OF MAGNESIA) suspension 30 mL  30 mL Oral Daily PRN Lindon Romp A, NP      . multivitamin with minerals tablet 1 tablet  1 tablet Oral Daily Zimri Brennen, Myer Peer, MD   1 tablet at 05/13/19 0818  . nicotine (NICODERM CQ - dosed in mg/24 hours) patch 21 mg  21 mg Transdermal Daily Lindon Romp A, NP   21 mg at 05/13/19 0820  . pantoprazole (PROTONIX) EC tablet 40 mg  40 mg Oral Daily  Yoana Staib, Myer Peer, MD   40 mg at 05/13/19 0820  . propranolol (INDERAL) tablet 40 mg  40 mg Oral BID Lindon Romp A, NP   40 mg at 05/13/19 0818  . thiamine (B-1) injection 100 mg  100 mg Intramuscular Once Abdalrahman Clementson A, MD      . thiamine (VITAMIN B-1) tablet 100 mg  100 mg Oral  Daily Corliss Lamartina, Myer Peer, MD   100 mg at 05/13/19 0818  . traZODone (DESYREL) tablet 50 mg  50 mg Oral QHS PRN Myra Weng, Myer Peer, MD   50 mg at 05/12/19 2122    Lab Results:  Results for orders placed or performed during the hospital encounter of 05/09/19 (from the past 48 hour(s))  Glucose, capillary     Status: Abnormal   Collection Time: 05/11/19  4:55 PM  Result Value Ref Range   Glucose-Capillary 146 (H) 70 - 99 mg/dL   Comment 1 Notify RN     Blood Alcohol level:  Lab Results  Component Value Date   ETH 105 (H) 05/08/2019   ETH 28 (H) 58/07/9832    Metabolic Disorder Labs: Lab Results  Component Value Date   HGBA1C 6.0 (H) 05/10/2019   MPG 125.5 05/10/2019   MPG 114.02 02/20/2018   No results found for: PROLACTIN Lab Results  Component Value Date   CHOL 192 02/20/2018   TRIG 142 02/20/2018   HDL 79 02/20/2018   CHOLHDL 2.4 02/20/2018   VLDL 28 02/20/2018   LDLCALC 85 02/20/2018   LDLCALC 124 (H) 12/30/2015    Physical Findings: AIMS: Facial and Oral Movements Muscles of Facial Expression: None, normal Lips and Perioral Area: None, normal Jaw: None, normal Tongue: None, normal,Extremity Movements Upper (arms, wrists, hands, fingers): None, normal Lower (legs, knees, ankles, toes): None, normal, Trunk Movements Neck, shoulders, hips: None, normal, Overall Severity Severity of abnormal movements (highest score from questions above): None, normal Incapacitation due to abnormal movements: None, normal Patient's awareness of abnormal movements (rate only patient's report): No Awareness, Dental Status Current problems with teeth and/or dentures?: No Does patient usually wear  dentures?: No  CIWA:  CIWA-Ar Total: 3 COWS:     Musculoskeletal: Strength & Muscle Tone: within normal limits Gait & Station: normal Patient leans: N/A  Psychiatric Specialty Exam: Physical Exam  Nursing note and vitals reviewed. Constitutional: He is oriented to person, place, and time. He appears well-developed and well-nourished.  Cardiovascular: Normal rate.  Respiratory: Effort normal.  Neurological: He is alert and oriented to person, place, and time.    Review of Systems  Constitutional: Positive for diaphoresis and malaise/fatigue. Negative for chills and fever.  Respiratory: Negative for cough and shortness of breath.   Cardiovascular: Negative for chest pain.  Gastrointestinal: Negative for diarrhea, nausea and vomiting.  Neurological: Positive for headaches.  Psychiatric/Behavioral: Positive for depression and substance abuse. Negative for hallucinations and suicidal ideas. The patient is not nervous/anxious and does not have insomnia.   Denies headache, denies chest pain or shortness of breath, no cough , no vomiting, no diarrhea ,no fever, no chills  Blood pressure 123/82, pulse 71, temperature 98.1 F (36.7 C), temperature source Oral, resp. rate 20, height '6\' 3"'$  (1.905 m), weight 127 kg, SpO2 97 %.Body mass index is 35 kg/m.  General Appearance:   Eye Contact:  Good  Speech:  Normal Rate  Volume:  Normal  Mood:  Reports lingering depression but acknowledges some improvement compared to admission  Affect:  Vaguely constricted, tends to improve during session  Thought Process:  Linear and Descriptions of Associations: Intact  Orientation:  Other:  Presents fully alert and attentive  Thought Content:  Today does not endorse hallucinations, does not appear internally preoccupied, no delusions are expressed  Suicidal Thoughts:  denies-denies suicidal or self-injurious ideations  Homicidal Thoughts:  denies  Memory:  Recent and remote grossly intact  Judgement:   Fair/improving  Insight:  Fair  Psychomotor Activity:  Decreased-does not appear restless or in any acute distress, appears comfortable  Concentration:  Concentration: Good and Attention Span: Good  Recall:  Good  Fund of Knowledge:  Good  Language:  Good  Akathisia:  No  Handed:  Right  AIMS (if indicated):     Assets:  Communication Skills Desire for Improvement Housing  ADL's:  Intact  Cognition:  WNL  Sleep:  Number of Hours: 3.25   Assessment:  58 year old male, presented to hospital voluntarily for depression, suicidal ideations, neurovegetative symptoms, alcohol dependence.  Reported having relapsed on alcohol 3 weeks prior after a period of about a year of sobriety.  Admission blood alcohol level 105, admission UDS positive for cocaine.  At this time patient describes some persistent depression but does acknowledge partial improvement compared to admission.  He does continue to report a subjective sense of low energy and poor motivation.  He is not endorsing psychotic symptoms and does not appear internally preoccupied.  Denies suicidal ideations and is presenting future oriented.  Currently no significant alcohol withdrawal symptoms and vitals are stable. He is tolerating current medication regimen well.  Treatment Plan Summary: Daily contact with patient to assess and evaluate symptoms and progress in treatment and Medication management  Treatment plan reviewed as below today 6/14 Encourage group and milieu participation to work on coping skills and symptom reduction Encourage efforts to work on sobriety and relapse prevention Continue Prozac  40 mg PO daily for mood Continue Ativan for alcohol withdrawal symptoms as needed per CIWA score Continue Neurontin 300 mg PO TID for anxiety/chronic pain Continue Vistaril 25 mg Q6HR PRN anxiety Continue propanolol 40 mg PO BID for HTN Continue Protonix 40 mg PO daily for GERD Continue thiamine 100 mg PO daily for  supplementation Continue Trazodone 50 mg PO QHS PRN insomnia Treatment team working on disposition planning options  Jenne Campus, MD 05/13/2019, 2:18 PM   Patient ID: [name], male   DOB: 1961-06-01, 58 y.o.   MRN: [id number]

## 2019-05-13 NOTE — [facility] Group Notes (Signed)
[facility] LCSW Group Therapy Note  Date/Time:  05/13/2019 9:00-10:00 or 10:00-11:00AM  Type of Therapy and Topic:  Group Therapy:  Healthy and Unhealthy Supports  Participation Level:  Did Not Attend   Description of Group:  Patients in this group were introduced to the idea of adding a variety of healthy supports to address the various needs in their lives.Patients discussed what additional healthy supports could be helpful in their recovery and wellness after discharge in order to prevent future hospitalizations.   An emphasis was placed on using counselor, doctor, therapy groups, 12-step groups, and problem-specific support groups to expand supports.  They also worked as a group on developing a specific plan for several patients to deal with unhealthy supports through boundary-setting, psychoeducation with loved ones, and even termination of relationships.   Therapeutic Goals:   1)  discuss importance of adding supports to stay well once out of the hospital  2)  compare healthy versus unhealthy supports and identify some examples of each  3)  generate ideas and descriptions of healthy supports that can be added  4)  offer mutual support about how to address unhealthy supports  5)  encourage active participation in and adherence to discharge plan    Summary of Patient Progress:  Did not attend   Therapeutic Modalities:   Motivational Interviewing Brief Solution-Focused Therapy  Steven Daniels         

## 2019-05-13 NOTE — Progress Notes (Signed)
Psychoeducational Group Note  Date:  05/13/2019 Time: 2030 Group Topic/Focus:  wrap up group  Participation Level: Did Not Attend  Participation Quality:  Not Applicable  Affect:  Not Applicable  Cognitive:  Not Applicable  Insight:  Not Applicable  Engagement in Group: Not Applicable  Additional Comments: Pt was asleep during group time.   [name] 05/13/2019, 9:26 PM

## 2019-05-13 NOTE — Progress Notes (Signed)
Pt continues to talk to self loudly at night.  Has had Trazodone as ordered as well as Ativan 1 mg.   Eyes appear closed, no distress noted.  Will continue to monitor.

## 2019-05-13 NOTE — Progress Notes (Signed)
D-pt has spent all day in his bed, pt did not attend any of the am groups, pt did not fill out his daily self inventory sheet A-pt took his medications R-cont to monitor for safety

## 2019-05-14 NOTE — Progress Notes (Signed)
Spiritual care group on grief and loss facilitated by chaplain Jerene Pitch  Group Goal:  Support / Education around grief and loss Members engage in facilitated group support and psycho social education.  Group Description:  Following introductions and group rules,  Group members engaged in facilitated group dialog and support around topic of loss, with particular support around experiences of loss in their lives. Group Identified types of loss (relationships / self / things) and identified patterns, circumstances, and changes that precipitate losses. Reflected on thoughts / feelings around loss, normalized grief responses, and recognized variety in grief experience. Patient Progress:  PT did not attend group.

## 2019-05-14 NOTE — Progress Notes (Signed)
Recreation Therapy Notes  Date:  6.15.20 Time: 0930 Location: [address] Dayroom  Group Topic: Stress Management  Goal Area(s) Addresses:  Patient will identify positive stress management techniques. Patient will identify benefits of using stress management post d/c.  Intervention: Stress Management  Activity :  Meditation.  LRT introduced the stress management technique of meditation.  LRT played a meditation of making the most of your day.  Patients were to listen and follow along as meditation played to engage in activity.  Education:  Stress Management, Discharge Planning.   Education Outcome: Acknowledges Education  Clinical Observations/Feedback:  Pt did not attend group session.    Victorino Sparrow, LRT/CTRS         Ria Comment, Mao Lockner A 05/14/2019 11:12 AM

## 2019-05-14 NOTE — Progress Notes (Signed)
Patient ID: Steven Daniels, male   DOB: 01/01/1961, 58 y.o.   MRN: [id number]   [place] NOVEL CORONAVIRUS (COVID-19) DAILY CHECK-OFF SYMPTOMS - answer yes or no to each - every day NO YES  Have you had a fever in the past 24 hours?  . Fever (Temp > 37.80C / 100F) X   Have you had any of these symptoms in the past 24 hours? . New Cough .  Sore Throat  .  Shortness of Breath .  Difficulty Breathing .  Unexplained Body Aches   X   Have you had any one of these symptoms in the past 24 hours not related to allergies?   . Runny Nose .  Nasal Congestion .  Sneezing   X   If you have had runny nose, nasal congestion, sneezing in the past 24 hours, has it worsened?  X   EXPOSURES - check yes or no X   Have you traveled outside the state in the past 14 days?  X   Have you been in contact with someone with a confirmed diagnosis of COVID-19 or PUI in the past 14 days without wearing appropriate PPE?  X   Have you been living in the same home as a person with confirmed diagnosis of COVID-19 or a PUI (household contact)?    X   Have you been diagnosed with COVID-19?    X              What to do next: Answered NO to all: Answered YES to anything:   Proceed with unit schedule Follow the [facility] Inpatient Flowsheet.   

## 2019-05-14 NOTE — Tx Team (Signed)
Interdisciplinary Treatment and Diagnostic Plan Update  05/14/2019 Time of Session: 9:00am Steven CuriaClay Daniels MRN: [id number]  Principal Diagnosis: <principal problem not specified>  Secondary Diagnoses: Active Problems:   Severe recurrent major depression without psychotic features (HCC)   Current Medications:  Current Facility-Administered Medications  Medication Dose Route Frequency Provider Last Rate Last Dose  . acetaminophen (TYLENOL) tablet 650 mg  650 mg Oral Q6H PRN Nira ConnBerry, Jason A, NP   650 mg at 05/10/19 1152  . albuterol (VENTOLIN HFA) 108 (90 Base) MCG/ACT inhaler 2 puff  2 puff Inhalation QID PRN Nira ConnBerry, Jason A, NP      . alum & mag hydroxide-simeth (MAALOX/MYLANTA) 200-200-20 MG/5ML suspension 30 mL  30 mL Oral Q4H PRN Nira ConnBerry, Jason A, NP      . FLUoxetine (PROZAC) capsule 40 mg  40 mg Oral Daily Aldean BakerSykes, Janet E, NP   40 mg at 05/14/19 0756  . gabapentin (NEURONTIN) capsule 300 mg  300 mg Oral TID Nira ConnBerry, Jason A, NP   300 mg at 05/14/19 0756  . LORazepam (ATIVAN) tablet 1 mg  1 mg Oral Q6H PRN Cobos, Rockey SituFernando A, MD   1 mg at 05/12/19 2322  . magnesium hydroxide (MILK OF MAGNESIA) suspension 30 mL  30 mL Oral Daily PRN Nira ConnBerry, Jason A, NP      . multivitamin with minerals tablet 1 tablet  1 tablet Oral Daily Cobos, Rockey SituFernando A, MD   1 tablet at 05/14/19 0756  . nicotine (NICODERM CQ - dosed in mg/24 hours) patch 21 mg  21 mg Transdermal Daily Nira ConnBerry, Jason A, NP   21 mg at 05/14/19 0756  . pantoprazole (PROTONIX) EC tablet 40 mg  40 mg Oral Daily Cobos, Rockey SituFernando A, MD   40 mg at 05/14/19 0756  . propranolol (INDERAL) tablet 40 mg  40 mg Oral BID Nira ConnBerry, Jason A, NP   40 mg at 05/14/19 0756  . thiamine (B-1) injection 100 mg  100 mg Intramuscular Once Cobos, Fernando A, MD      . thiamine (VITAMIN B-1) tablet 100 mg  100 mg Oral Daily Cobos, Rockey SituFernando A, MD   100 mg at 05/14/19 0756  . traZODone (DESYREL) tablet 50 mg  50 mg Oral QHS PRN Cobos, Rockey SituFernando A, MD   50 mg at 05/12/19 2122    PTA Medications: Medications Prior to Admission  Medication Sig Dispense Refill Last Dose  . albuterol (PROVENTIL HFA;VENTOLIN HFA) 108 (90 Base) MCG/ACT inhaler Inhale 2 puffs into the lungs 4 (four) times daily as needed for wheezing or shortness of breath. 1 Inhaler 3   . albuterol (PROVENTIL) (2.5 MG/3ML) 0.083% nebulizer solution Take 3 mLs (2.5 mg total) by nebulization 3 (three) times daily. (Patient not taking: Reported on 05/08/2019) 75 mL 12   . alum & mag hydroxide-simeth (MYLANTA) 200-200-20 MG/5ML suspension Take 15-30 mLs by mouth every 6 (six) hours as needed for indigestion or heartburn.     . benzonatate (TESSALON) 100 MG capsule Take 2 capsules (200 mg total) by mouth 3 (three) times daily as needed for cough. (Patient not taking: Reported on 05/08/2019) 45 capsule 0   . buPROPion (WELLBUTRIN XL) 300 MG 24 hr tablet Take 300 mg by mouth daily.     . cetirizine (ZYRTEC) 10 MG tablet Take 10 mg by mouth daily as needed for allergies.     . Fructose-Dextrose-Phosphor Acd (NAUSEA CONTROL) 1.87-1.87-21.5 SOLN Take 5-10 mLs by mouth daily as needed (nausea).     . gabapentin (NEURONTIN) 300 MG  capsule Take 1 capsule (300 mg total) by mouth 3 (three) times daily. (Patient taking differently: Take 300 mg by mouth See admin instructions. Take 300 mg by mouth two times a day and an additional 300 mg once a day as needed for nerve pain) 90 capsule 0   . guaiFENesin (MUCINEX) 600 MG 12 hr tablet Take 1 tablet (600 mg total) by mouth 2 (two) times daily. 9-5 Congestion (Patient not taking: Reported on 05/08/2019) 30 tablet 0   . ibuprofen (ADVIL,MOTRIN) 600 MG tablet Take 600 mg by mouth 2 (two) times a day.      . loperamide (IMODIUM A-D) 2 MG tablet Take 2 mg by mouth 4 (four) times daily as needed for diarrhea or loose stools.     . Multiple Vitamin-Folic Acid TABS Take 1 tablet by mouth daily with lunch.      . nicotine (NICODERM CQ - DOSED IN MG/24 HOURS) 21 mg/24hr patch Place 1 patch (21 mg  total) onto the skin daily. (Patient not taking: Reported on 05/08/2019) 28 patch 0   . ondansetron (ZOFRAN) 8 MG tablet Take 8 mg by mouth 2 (two) times daily as needed for nausea or vomiting.     . predniSONE (DELTASONE) 10 MG tablet Prednisone dosing: Take  Prednisone 40mg  (4 tabs) x 3 days, then taper to 30mg  (3 tabs) x 3 days, then 20mg  (2 tabs) x 3days, then 10mg  (1 tab) x 3days, then OFF.  Dispense:  30 tabs, refills: None (Patient not taking: Reported on 05/08/2019) 30 tablet 0   . propranolol (INDERAL) 80 MG tablet Take 1 tablet (80 mg total) by mouth daily. (Patient taking differently: Take 40 mg by mouth 2 (two) times daily. ) 30 tablet 0   . Sennosides (LAXATIVE) 25 MG TABS Take 1 tablet by mouth daily as needed (constipation).     . thiamine (VITAMIN B-1) 100 MG tablet Take 100 mg by mouth daily.     . traZODone (DESYREL) 50 MG tablet Take 50 mg by mouth at bedtime.      Marland Kitchen. umeclidinium-vilanterol (ANORO ELLIPTA) 62.5-25 MCG/INH AEPB Inhale 1 puff into the lungs daily. (Patient not taking: Reported on 05/08/2019) 30 each 3     Patient Stressors: Financial difficulties Health problems Medication change or noncompliance Substance abuse  Patient Strengths: Active sense of humor General fund of knowledge Motivation for treatment/growth  Treatment Modalities: Medication Management, Group therapy, Case management,  1 to 1 session with clinician, Psychoeducation, Recreational therapy.   Physician Treatment Plan for Primary Diagnosis: <principal problem not specified> Long Term Goal(s): Improvement in symptoms so as ready for discharge Improvement in symptoms so as ready for discharge   Short Term Goals: Ability to identify changes in lifestyle to reduce recurrence of condition will improve Ability to verbalize feelings will improve Ability to disclose and discuss suicidal ideas Ability to demonstrate self-control will improve Ability to identify and develop effective coping behaviors  will improve  Medication Management: Evaluate patient's response, side effects, and tolerance of medication regimen.  Therapeutic Interventions: 1 to 1 sessions, Unit Group sessions and Medication administration.  Evaluation of Outcomes: Progressing  Physician Treatment Plan for Secondary Diagnosis: Active Problems:   Severe recurrent major depression without psychotic features (HCC)  Long Term Goal(s): Improvement in symptoms so as ready for discharge Improvement in symptoms so as ready for discharge   Short Term Goals: Ability to identify changes in lifestyle to reduce recurrence of condition will improve Ability to verbalize feelings will improve Ability  to disclose and discuss suicidal ideas Ability to demonstrate self-control will improve Ability to identify and develop effective coping behaviors will improve     Medication Management: Evaluate patient's response, side effects, and tolerance of medication regimen.  Therapeutic Interventions: 1 to 1 sessions, Unit Group sessions and Medication administration.  Evaluation of Outcomes: Progressing   RN Treatment Plan for Primary Diagnosis: <principal problem not specified> Long Term Goal(s): Knowledge of disease and therapeutic regimen to maintain health will improve  Short Term Goals: Ability to participate in decision making will improve, Ability to identify and develop effective coping behaviors will improve and Compliance with prescribed medications will improve  Medication Management: RN will administer medications as ordered by provider, will assess and evaluate patient's response and provide education to patient for prescribed medication. RN will report any adverse and/or side effects to prescribing provider.  Therapeutic Interventions: 1 on 1 counseling sessions, Psychoeducation, Medication administration, Evaluate responses to treatment, Monitor vital signs and CBGs as ordered, Perform/monitor CIWA, COWS, AIMS and Fall  Risk screenings as ordered, Perform wound care treatments as ordered.  Evaluation of Outcomes: Progressing   LCSW Treatment Plan for Primary Diagnosis: <principal problem not specified> Long Term Goal(s): Safe transition to appropriate next level of care at discharge, Engage patient in therapeutic group addressing interpersonal concerns.  Short Term Goals: Engage patient in aftercare planning with referrals and resources, Increase social support, Identify triggers associated with mental health/substance abuse issues and Increase skills for wellness and recovery  Therapeutic Interventions: Assess for all discharge needs, 1 to 1 time with Social worker, Explore available resources and support systems, Assess for adequacy in community support network, Educate family and significant other(s) on suicide prevention, Complete Psychosocial Assessment, Interpersonal group therapy.  Evaluation of Outcomes: Progressing   Progress in Treatment: Attending groups: No. Participating in groups: No. Taking medication as prescribed: Yes. Toleration medication: Yes. Family/Significant other contact made: Yes, individual(s) contacted:  sister Patient understands diagnosis: Yes. Discussing patient identified problems/goals with staff: Yes. Medical problems stabilized or resolved: Yes. Denies suicidal/homicidal ideation: No. Issues/concerns per patient self-inventory: Yes.  New problem(s) identified: No, Describe:  CSW continuing to assess  New Short Term/Long Term Goal(s):  detox, medication management for mood stabilization; elimination of SI thoughts; development of comprehensive mental wellness/sobriety plan.  Patient Goals:    Discharge Plan or Barriers: CSW continuing to assess, patient wishes to discharge to [facility] residential, but there is a waitlist for beds.  Reason for Continuation of Hospitalization: Anxiety Depression Suicidal ideation  Estimated Length of Stay: 3-5  days  Attendees: Patient: 05/14/2019 10:25 AM  Physician:  05/14/2019 10:25 AM  Nursing:  05/14/2019 10:25 AM  RN Care Manager: 05/14/2019 10:25 AM  Social Worker: Stephanie Acre, [place] 05/14/2019 10:25 AM  Recreational Therapist:  05/14/2019 10:25 AM  Other:  05/14/2019 10:25 AM  Other:  05/14/2019 10:25 AM  Other: 05/14/2019 10:25 AM    Scribe for Treatment Team: Joellen Jersey, [place] 05/14/2019 10:25 AM

## 2019-05-14 NOTE — Progress Notes (Signed)
[place] NOVEL CORONAVIRUS (COVID-19) DAILY CHECK-OFF SYMPTOMS - answer yes or no to each - every day NO YES  Have you had a fever in the past 24 hours?  . Fever (Temp > 37.80C / 100F) X   Have you had any of these symptoms in the past 24 hours? . New Cough .  Sore Throat  .  Shortness of Breath .  Difficulty Breathing .  Unexplained Body Aches   X   Have you had any one of these symptoms in the past 24 hours not related to allergies?   . Runny Nose .  Nasal Congestion .  Sneezing   X   If you have had runny nose, nasal congestion, sneezing in the past 24 hours, has it worsened?  X   EXPOSURES - check yes or no X   Have you traveled outside the state in the past 14 days?  X   Have you been in contact with someone with a confirmed diagnosis of COVID-19 or PUI in the past 14 days without wearing appropriate PPE?  X   Have you been living in the same home as a person with confirmed diagnosis of COVID-19 or a PUI (household contact)?    X   Have you been diagnosed with COVID-19?    X              What to do next: Answered NO to all: Answered YES to anything:   Proceed with unit schedule Follow the [facility] Inpatient Flowsheet.   

## 2019-05-14 NOTE — Progress Notes (Signed)
Patient ID: Steven Daniels, male   DOB: July 14, 1961, 58 y.o.   MRN: [id number] D) Pt has been isolative to room. Seclusive to self. Negative for unit activities despite prompting. Pt is calm, cooperative, and cautious on approach. No PRN's required at this point. No distress noted. Pt appears preoccupied. Contracts for safety. No distress noted. A) Level 3 obs for safety. Support and reassurance provided. Med ed reinforced. R) Cooperative.

## 2019-05-14 NOTE — Care Management (Signed)
CMA spoke with Admission Coordinator, June at [facility]. There are currently no beds available at this time. Patient is still on the waiting list.   June will contact CMA when a bed becomes available.   CMA will notify LCSWA, Stephanie Acre.     Charlet Harr Care Management Assistant  Email:[email or web address] Office: [phone]

## 2019-05-14 NOTE — Progress Notes (Signed)
[name] MD Progress Note  05/14/2019 5:06 PM Steven Daniels  MRN:  [id number] Subjective: Patient reports that today he is " starting to feel better". States " I feel like I have turned the corner, finally, and now I am really getting better". Denies medication side effects.   Objective : I have met with patient and have reviewed chart notes. 58 year old male, presented to hospital voluntarily for depression, suicidal ideations, neurovegetative symptoms, alcohol dependence.  Reported having relapsed on alcohol 3 weeks prior after a period of about a year of sobriety.  Admission blood alcohol level 105, admission UDS positive for cocaine.  Currently patient reports feeling better than he did on admission and states that today " is the first day I am really feeling a lot better". As he improves he is presenting  future oriented, and states he is hoping to go to [facility] later this week.  Currently denies suicidal ideations. Denies medication side effects. Does not currently endorse symptoms of alcohol WDL - no tremors, no diaphoresis, no restlessness or agitation, presents calm, comfortable, vitals are currently stable. Pleasant, cooperative on approach. Limited group,milieu participation, spends most time in his room.      Principal Problem:  Alcohol Use Disorder, alcohol induced mood disorder versus MDD  Diagnosis: Active Problems:   Severe recurrent major depression without psychotic features ([place])  Total Time spent with patient: 15 minutes  Past Psychiatric History: See admission H&P  Past Medical History:  Past Medical History:  Diagnosis Date  . Alcohol abuse   . Bipolar affective disorder ([place])   . Depression   . Hard of hearing   . Heart murmur    per pt 12/28/15  . Hepatitis C   . Hypertension   . Seizures ([place])     Past Surgical History:  Procedure Laterality Date  . HIP FRACTURE SURGERY Right    x 3  . KNEE SURGERY Right    Family History:  Family History   Problem Relation Age of Onset  . Other Mother        brain tumor behind left ear  . Alzheimer's disease Mother   . Skin cancer Father        melatomia  . Breast cancer Maternal Grandmother   . Heart disease Paternal Uncle   . Heart disease Cousin        fathers side  . Colon cancer Neg Hx   . Esophageal cancer Neg Hx    Family Psychiatric  History: See admission H&P Social History:  Social History   Substance and Sexual Activity  Alcohol Use Yes  . Frequency: Never   Comment: patient states he is in rehab     Social History   Substance and Sexual Activity  Drug Use Not Currently  . Types: Cocaine   Comment: crack, heroin -snort last use 12/27/2017    Social History   Socioeconomic History  . Marital status: Single    Spouse name: Not on file  . Number of children: 0  . Years of education: Not on file  . Highest education level: Not on file  Occupational History  . Occupation: disabled  Social Needs  . Financial resource strain: Not on file  . Food insecurity    Worry: Not on file    Inability: Not on file  . Transportation needs    Medical: Not on file    Non-medical: Not on file  Tobacco Use  . Smoking status: Current Every Day Smoker  Packs/day: 1.00    Types: Cigarettes  . Smokeless tobacco: Never Used  Substance and Sexual Activity  . Alcohol use: Yes    Frequency: Never    Comment: patient states he is in rehab  . Drug use: Not Currently    Types: Cocaine    Comment: crack, heroin -snort last use 12/27/2017  . Sexual activity: Not Currently    Birth control/protection: None  Lifestyle  . Physical activity    Days per week: Not on file    Minutes per session: Not on file  . Stress: Not on file  Relationships  . Social Herbalist on phone: Not on file    Gets together: Not on file    Attends religious service: Not on file    Active member of club or organization: Not on file    Attends meetings of clubs or organizations: Not on  file    Relationship status: Not on file  Other Topics Concern  . Not on file  Social History Narrative   Divorced, no children   Veteran of [employer] and a graduate of [employer] college with a degree in radio TV and marketing formally worked in that [employer]   Cocaine heroine and alcohol use.  States intention to quit currently started opiates after her first hip surgery   He is a smoker   11/23/2017      Additional Social History:    Pain Medications: see MAR Prescriptions: see MAR Over the Counter: see MAR History of alcohol / drug use?: Yes Longest period of sobriety (when/how long): unsure Negative Consequences of Use: Financial, Personal relationships Withdrawal Symptoms: Sweats, Tremors  Sleep: improving   Appetite:  Good  Current Medications: Current Facility-Administered Medications  Medication Dose Route Frequency Provider Last Rate Last Dose  . acetaminophen (TYLENOL) tablet 650 mg  650 mg Oral Q6H PRN Lindon Romp A, NP   650 mg at 05/10/19 1152  . albuterol (VENTOLIN HFA) 108 (90 Base) MCG/ACT inhaler 2 puff  2 puff Inhalation QID PRN Lindon Romp A, NP      . alum & mag hydroxide-simeth (MAALOX/MYLANTA) 200-200-20 MG/5ML suspension 30 mL  30 mL Oral Q4H PRN Lindon Romp A, NP      . FLUoxetine (PROZAC) capsule 40 mg  40 mg Oral Daily Connye Burkitt, NP   40 mg at 05/14/19 0756  . gabapentin (NEURONTIN) capsule 300 mg  300 mg Oral TID Lindon Romp A, NP   300 mg at 05/14/19 1324  . LORazepam (ATIVAN) tablet 1 mg  1 mg Oral Q6H PRN , Myer Peer, MD   1 mg at 05/12/19 2322  . magnesium hydroxide (MILK OF MAGNESIA) suspension 30 mL  30 mL Oral Daily PRN Lindon Romp A, NP      . multivitamin with minerals tablet 1 tablet  1 tablet Oral Daily , Myer Peer, MD   1 tablet at 05/14/19 0756  . nicotine (NICODERM CQ - dosed in mg/24 hours) patch 21 mg  21 mg Transdermal Daily Lindon Romp A, NP   21 mg at 05/14/19 0756  . pantoprazole (PROTONIX) EC  tablet 40 mg  40 mg Oral Daily , Myer Peer, MD   40 mg at 05/14/19 0756  . propranolol (INDERAL) tablet 40 mg  40 mg Oral BID Lindon Romp A, NP   40 mg at 05/14/19 0756  . thiamine (B-1) injection 100 mg  100 mg Intramuscular Once , Myer Peer, MD      .  thiamine (VITAMIN B-1) tablet 100 mg  100 mg Oral Daily , Myer Peer, MD   100 mg at 05/14/19 0756  . traZODone (DESYREL) tablet 50 mg  50 mg Oral QHS PRN , Myer Peer, MD   50 mg at 05/12/19 2122    Lab Results:  No results found for this or any previous visit (from the past 48 hour(s)).  Blood Alcohol level:  Lab Results  Component Value Date   ETH 105 (H) 05/08/2019   ETH 28 (H) 37/48/2707    Metabolic Disorder Labs: Lab Results  Component Value Date   HGBA1C 6.0 (H) 05/10/2019   MPG 125.5 05/10/2019   MPG 114.02 02/20/2018   No results found for: PROLACTIN Lab Results  Component Value Date   CHOL 192 02/20/2018   TRIG 142 02/20/2018   HDL 79 02/20/2018   CHOLHDL 2.4 02/20/2018   VLDL 28 02/20/2018   LDLCALC 85 02/20/2018   LDLCALC 124 (H) 12/30/2015    Physical Findings: AIMS: Facial and Oral Movements Muscles of Facial Expression: None, normal Lips and Perioral Area: None, normal Jaw: None, normal Tongue: None, normal,Extremity Movements Upper (arms, wrists, hands, fingers): None, normal Lower (legs, knees, ankles, toes): None, normal, Trunk Movements Neck, shoulders, hips: None, normal, Overall Severity Severity of abnormal movements (highest score from questions above): None, normal Incapacitation due to abnormal movements: None, normal Patient's awareness of abnormal movements (rate only patient's report): No Awareness, Dental Status Current problems with teeth and/or dentures?: No Does patient usually wear dentures?: No  CIWA:  CIWA-Ar Total: 0 COWS:     Musculoskeletal: Strength & Muscle Tone: within normal limits Gait & Station: normal Patient leans: N/A  Psychiatric Specialty  Exam: Physical Exam  Nursing note and vitals reviewed. Constitutional: He is oriented to person, place, and time. He appears well-developed and well-nourished.  Cardiovascular: Normal rate.  Respiratory: Effort normal.  Neurological: He is alert and oriented to person, place, and time.    Review of Systems  Constitutional: Positive for diaphoresis and malaise/fatigue. Negative for chills and fever.  Respiratory: Negative for cough and shortness of breath.   Cardiovascular: Negative for chest pain.  Gastrointestinal: Negative for diarrhea, nausea and vomiting.  Neurological: Positive for headaches.  Psychiatric/Behavioral: Positive for depression and substance abuse. Negative for hallucinations and suicidal ideas. The patient is not nervous/anxious and does not have insomnia.   Denies headache, denies chest pain or shortness of breath, no cough , no vomiting, no diarrhea ,no fever, no chills  Blood pressure 117/74, pulse 63, temperature 98.2 F (36.8 C), temperature source Oral, resp. rate 16, height '6\' 3"'$  (1.905 m), weight 127 kg, SpO2 92 %.Body mass index is 35 kg/m.  General Appearance:   Eye Contact:  Good  Speech:  Normal Rate  Volume:  Normal  Mood:  reports he is feeling better today, and presents with improving mood ]  Affect:  more reactive, smiling at times during session  Thought Process:  Linear and Descriptions of Associations: Intact  Orientation:  Other:  Presents fully alert and attentive  Thought Content:  Today does not endorse hallucinations, does not appear internally preoccupied, no delusions are expressed  Suicidal Thoughts:  denies-denies suicidal or self-injurious ideations  Homicidal Thoughts:  denies  Memory:  Recent and remote grossly intact  Judgement:  improving  Insight:  Fair  Psychomotor Activity:  Decreased-does not appear restless or in any acute distress, appears comfortable  Concentration:  Concentration: Good and Attention Span: Good  Recall:  Good  Fund of Knowledge:  Good  Language:  Good  Akathisia:  No  Handed:  Right  AIMS (if indicated):     Assets:  Communication Skills Desire for Improvement Housing  ADL's:  Intact  Cognition:  WNL  Sleep:  Number of Hours: 5.75   Assessment:  58 year old male, presented to hospital voluntarily for depression, suicidal ideations, neurovegetative symptoms, alcohol dependence.  Reported having relapsed on alcohol 3 weeks prior after a period of about a year of sobriety.  Admission blood alcohol level 105, admission UDS positive for cocaine.  Patient reports he is feeling noticeably better today and that he feels he has " turned a corner" in so far as his progress and improvement. Presents with improving mood and range of affect, denies suicidal ideations and is currently future oriented, hoping to go to [facility] later this week. Denies medication side effects at this time.  Treatment Plan Summary: Daily contact with patient to assess and evaluate symptoms and progress in treatment and Medication management  Treatment plan reviewed as below today 6/15 Encourage group and milieu participation to work on coping skills and symptom reduction Encourage efforts to work on sobriety and relapse prevention Continue Prozac 40 mg PO daily for mood disorder/ depression Continue Neurontin 300 mg PO TID for anxiety/chronic pain Continue Vistaril 25 mg Q6HR PRN anxiety Continue propanolol 40 mg PO BID for HTN Continue Protonix 40 mg PO daily for GERD Continue thiamine 100 mg PO daily for supplementation Continue Trazodone 50 mg PO QHS PRN insomnia Treatment team working on disposition planning options  Jenne Campus, MD 05/14/2019, 5:06 PM   Patient ID: Steven Daniels, male   DOB: February 09, 1961, 58 y.o.   MRN: [id number]

## 2019-05-15 NOTE — Progress Notes (Signed)
CSW met with patient at bedside to discuss discharge tomorrow and outpatient follow up. Patient hoped to discharge from [facility] to [facility]. He called [facility] prior to admission and was under the impression that a bed was being held for him, but this was not the case.  CSW has called daily for updates, unfortunately, [facility] does not expect to have an available bed until next week. Patient is aware and he is aware that he may follow up with the referral.  He is agreeable to follow up with [facility] for outpatient.  Patient reports his car is at [facility] and he will need to get back to his car at discharge tomorrow.  Stephanie Acre, LCSW-A Clinical Social Worker

## 2019-05-15 NOTE — [facility] Group Notes (Signed)
[facility] Group Therapy 05/15/2019 2:53 PM  Type of Therapy: Mental Health Association Presentation  Participation Level: Did not attend.    Jerrald Doverspike, MSW, LCSWA 05/15/2019 2:53 PM 

## 2019-05-15 NOTE — Plan of Care (Addendum)
Patient was anxious upon approach. Medications were administered and patient asked for Vistaril, which is not on his medication list. Ativan given for w/d symptoms. Patient said he feels worse today than he's felt since he got here. Denies SI HI AVH. Endorses tremors, headache, nausea.  Safety is maintained with 15 minute checks as well as environmental checks. Will continue to monitor.  Problem: Education: Goal: Ability to make informed decisions regarding treatment will improve Outcome: Progressing   Problem: Coping: Goal: Coping ability will improve Outcome: Progressing   Problem: Health Behavior/Discharge Planning: Goal: Identification of resources available to assist in meeting health care needs will improve Outcome: Progressing   Problem: Medication: Goal: Compliance with prescribed medication regimen will improve Outcome: Progressing

## 2019-05-15 NOTE — Care Management CC44 (Signed)
CMA spoke with Admission Coordinator, June at [facility]. Patient might be able to come Thursday for treatment at facility. June will contact CMA Thursday morning (6/17) with more information.    CMA has informed LCSWA, Stephanie Acre.    Koah Chisenhall Care Management Assistant  Email:[email or web address] Office: [phone]

## 2019-05-15 NOTE — Progress Notes (Signed)
[facility]  05/15/2019 12:59 PM Steven Daniels  MRN:  [id number] Subjective: Patient is a 58 year old male with a past psychiatric history significant for alcohol dependence, cocaine dependence, substance-induced mood disorder and alcohol withdrawal who was admitted on 05/09/2019 secondary to suicidal ideation as well as alcohol abuse.  Objective: Patient is a 58 year old male with the above-stated past psychiatric history who is seen in follow-up.  He denied complaint today.  He denied any withdrawal symptoms from alcohol.  He denied any mood symptoms.  He denied any suicidal or homicidal ideation.  The patient had remained in the hospital anticipating possible placement in his substance abuse rehabilitation program.  Unfortunately given the COVID and other social issues leading to rehabilitation facilities being full, as well as the homeless shelters, it does not appear as though a bed will be available within the next week or 2.  This was discussed with the patient today.  We discussed discharge planning with regard to possible discharge tomorrow.  The patient was in agreement with this plan.  His vital signs are stable, he is afebrile.  His CIWA was elevated at 10 today, but he has been hospitalized since 6/10 and completed a detoxification program.  He continues on fluoxetine, gabapentin, Protonix, propranolol, thiamine and trazodone.  Review of his laboratories revealed a mildly elevated glucose at 123 on 6/9 and AST of 85 on 6/11 (which had been 119), and an ALT of 137 on 6/11 (with previous at 179 on 6/9).  His hemoglobin and hematocrit were elevated at 17.1 and 53.1 respectively.  His hemoglobin A1c on admission was 6.0.  Principal Problem: <principal problem not specified> Diagnosis: Active Problems:   Severe recurrent major depression without psychotic features (HCC)  Total Time spent with patient: 20 minutes  Past Psychiatric History: See admission H&P  Past Medical History:   Past Medical History:  Diagnosis Date  . Alcohol abuse   . Bipolar affective disorder (HCC)   . Depression   . Hard of hearing   . Heart murmur    per pt 12/28/15  . Hepatitis C   . Hypertension   . Seizures (HCC)     Past Surgical History:  Procedure Laterality Date  . HIP FRACTURE SURGERY Right    x 3  . KNEE SURGERY Right    Family History:  Family History  Problem Relation Age of Onset  . Other Mother        brain tumor behind left ear  . Alzheimer's disease Mother   . Skin cancer Father        melatomia  . Breast cancer Maternal Grandmother   . Heart disease Paternal Uncle   . Heart disease Cousin        fathers side  . Colon cancer Neg Hx   . Esophageal cancer Neg Hx    Family Psychiatric  History: See admission H&P Social History:  Social History   Substance and Sexual Activity  Alcohol Use Yes  . Frequency: Never   Comment: patient states he is in rehab     Social History   Substance and Sexual Activity  Drug Use Not Currently  . Types: Cocaine   Comment: crack, heroin -snort last use 12/27/2017    Social History   Socioeconomic History  . Marital status: Single    Spouse name: Not on file  . Number of children: 0  . Years of education: Not on file  . Highest education level: Not on file  Occupational History  .  Occupation: disabled  Social Needs  . Financial resource strain: Not on file  . Food insecurity    Worry: Not on file    Inability: Not on file  . Transportation needs    Medical: Not on file    Non-medical: Not on file  Tobacco Use  . Smoking status: Current Every Day Smoker    Packs/day: 1.00    Types: Cigarettes  . Smokeless tobacco: Never Used  Substance and Sexual Activity  . Alcohol use: Yes    Frequency: Never    Comment: patient states he is in rehab  . Drug use: Not Currently    Types: Cocaine    Comment: crack, heroin -snort last use 12/27/2017  . Sexual activity: Not Currently    Birth control/protection: None   Lifestyle  . Physical activity    Days per week: Not on file    Minutes per session: Not on file  . Stress: Not on file  Relationships  . Social Musicianconnections    Talks on phone: Not on file    Gets together: Not on file    Attends religious service: Not on file    Active member of club or organization: Not on file    Attends meetings of clubs or organizations: Not on file    Relationship status: Not on file  Other Topics Concern  . Not on file  Social History Narrative   Divorced, no children   Veteran of 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  States intention to quit currently started opiates after her first hip surgery   He is a smoker   11/23/2017      Additional Social History:    Pain Medications: see MAR Prescriptions: see MAR Over the Counter: see MAR History of alcohol / drug use?: Yes Longest period of sobriety (when/how long): unsure Negative Consequences of Use: Financial, Personal relationships Withdrawal Symptoms: Sweats, Tremors                    Sleep: Good  Appetite:  Good  Current Medications: Current Facility-Administered Medications  Medication Dose Route Frequency Provider Last Rate Last Dose  . acetaminophen (TYLENOL) tablet 650 mg  650 mg Oral Q6H PRN Nira ConnBerry, Jason A, NP   650 mg at 05/10/19 1152  . albuterol (VENTOLIN HFA) 108 (90 Base) MCG/ACT inhaler 2 puff  2 puff Inhalation QID PRN Nira ConnBerry, Jason A, NP      . alum & mag hydroxide-simeth (MAALOX/MYLANTA) 200-200-20 MG/5ML suspension 30 mL  30 mL Oral Q4H PRN Nira ConnBerry, Jason A, NP      . FLUoxetine (PROZAC) capsule 40 mg  40 mg Oral Daily Aldean BakerSykes, Janet E, NP   40 mg at 05/15/19 0815  . gabapentin (NEURONTIN) capsule 300 mg  300 mg Oral TID Nira ConnBerry, Jason A, NP   300 mg at 05/15/19 1207  . LORazepam (ATIVAN) tablet 1 mg  1 mg Oral Q6H PRN Cobos, Rockey SituFernando A, MD   1 mg at  05/15/19 0817  . magnesium hydroxide (MILK OF MAGNESIA) suspension 30 mL  30 mL Oral Daily PRN Nira ConnBerry, Jason A, NP      . multivitamin with minerals tablet 1 tablet  1 tablet Oral Daily Cobos, Rockey SituFernando A, MD   1 tablet at 05/15/19 0815  . nicotine (NICODERM CQ - dosed in mg/24 hours) patch 21 mg  21 mg Transdermal Daily Nira ConnBerry, Jason A, NP   21 mg at 05/15/19 0815  . pantoprazole (PROTONIX) EC tablet 40 mg  40 mg Oral Daily Cobos, Rockey SituFernando A, MD   40 mg at 05/15/19 0815  . propranolol (INDERAL) tablet 40 mg  40 mg Oral BID Nira ConnBerry, Jason A, NP   40 mg at 05/15/19 0815  . thiamine (B-1) injection 100 mg  100 mg Intramuscular Once Cobos, Fernando A, MD      . thiamine (VITAMIN B-1) tablet 100 mg  100 mg Oral Daily Cobos, Rockey SituFernando A, MD   100 mg at 05/15/19 0815  . traZODone (DESYREL) tablet 50 mg  50 mg Oral QHS PRN Cobos, Rockey SituFernando A, MD   50 mg at 05/12/19 2122    Lab Results: No results found for this or any previous visit (from the past 48 hour(s)).  Blood Alcohol level:  Lab Results  Component Value Date   ETH 105 (H) 05/08/2019   ETH 28 (H) 02/18/2018    Metabolic Disorder Labs: Lab Results  Component Value Date   HGBA1C 6.0 (H) 05/10/2019   MPG 125.5 05/10/2019   MPG 114.02 02/20/2018   No results found for: PROLACTIN Lab Results  Component Value Date   CHOL 192 02/20/2018   TRIG 142 02/20/2018   HDL 79 02/20/2018   CHOLHDL 2.4 02/20/2018   VLDL 28 02/20/2018   LDLCALC 85 02/20/2018   LDLCALC 124 (H) 12/30/2015    Physical Findings: AIMS: Facial and Oral Movements Muscles of Facial Expression: None, normal Lips and Perioral Area: None, normal Jaw: None, normal Tongue: None, normal,Extremity Movements Upper (arms, wrists, hands, fingers): None, normal Lower (legs, knees, ankles, toes): None, normal, Trunk Movements Neck, shoulders, hips: None, normal, Overall Severity Severity of abnormal movements (highest score from questions above): None, normal Incapacitation due  to abnormal movements: None, normal Patient's awareness of abnormal movements (rate only patient's report): No Awareness, Dental Status Current problems with teeth and/or dentures?: No Does patient usually wear dentures?: No  CIWA:  CIWA-Ar Total: 10 COWS:     Musculoskeletal: Strength & Muscle Tone: within normal limits Gait & Station: normal Patient leans: N/A  Psychiatric Specialty Exam: Physical Exam  Nursing note and vitals reviewed. Constitutional: He is oriented to person, place, and time. He appears well-developed and well-nourished.  HENT:  Head: Normocephalic and atraumatic.  Respiratory: Effort normal.  Neurological: He is alert and oriented to person, place, and time.    ROS  Blood pressure 98/73, pulse 64, temperature 97.7 F (36.5 C), temperature source Oral, resp. rate 16, height 6\' 3"  (1.905 m), weight 127 kg, SpO2 92 %.Body mass index is 35 kg/m.  General Appearance: Casual  Eye Contact:  Good  Speech:  Normal Rate  Volume:  Normal  Mood:  Euthymic  Affect:  Congruent  Thought Process:  Coherent and Descriptions of Associations: Intact  Orientation:  Full (Time, Place, and Person)  Thought Content:  Logical  Suicidal Thoughts:  No  Homicidal Thoughts:  No  Memory:  Immediate;   Fair Recent;   Fair Remote;   Fair  Judgement:  Intact  Insight:  Fair  Psychomotor Activity:  Normal  Concentration:  Concentration: Fair and Attention Span: Fair  Recall:  FiservFair  Fund of Knowledge:  Fair  Language:  Fair  Akathisia:  Negative  Handed:  Right  AIMS (if indicated):     Assets:  Desire for Improvement Resilience  ADL's:  Intact  Cognition:  WNL  Sleep:  Number of Hours: 6.5     Treatment Plan Summary: Daily contact with patient to assess and evaluate symptoms and progress in treatment, Medication management and Plan : Patient is a 58 year old male with the above-stated past psychiatric history who is seen in follow-up.   Diagnosis: #1 alcohol  dependence, #2 cocaine dependence, #3 substance-induced mood disorder, #4 diabetes mellitus type 2, #5 abnormal liver function enzymes most likely secondary to alcohol, #6 unspecified anxiety, #7 COPD  Patient is seen and examined.  Patient is a 58 year old male with the above-stated past medical and psychiatric history who is seen in follow-up.  He is doing well.  He denied any suicidal or homicidal ideation.  He does desire to be able to get into a rehabilitation program.  He stated he would take himself down there and see if he would qualify.  We discussed the fact that there is a waiting list to be able to enter the program he is most interested in, and that the potential for being able to get into that facility is limited.  We discussed discharge in the a.m. tomorrow and he is in agreement with that.  No change in his current medications. 1.  Continue albuterol HFA 2 puffs inhaled 4 times daily as needed wheezing for COPD. 2.  Continue fluoxetine 40 mg p.o. daily for depression. 3.  Continue gabapentin 300 mg p.o. 3 times daily for neuropathic pain, anxiety. 4.  Stop lorazepam. 5.  Continue pantoprazole 40 mg p.o. daily for gastric protection. 6.  Continue propranolol 40 mg p.o. twice daily for anxiety and possible migraines. 7.  Continue trazodone 50 mg p.o. nightly as needed insomnia. 8.  Continue thiamine 100 mg p.o. daily for nutritional supplementation. 9.  Disposition planning-probable discharge in a.m. 6/17.  Sharma Covert, MD 05/15/2019, 12:59 PM

## 2019-05-16 MED ORDER — NICOTINE 21 MG/24HR TD PT24: 21.0000 mg | MEDICATED_PATCH | Freq: Every day | TRANSDERMAL | 0 refills | Status: AC

## 2019-05-16 MED ORDER — TRAZODONE HCL 50 MG PO TABS: 50.0000 mg | ORAL_TABLET | Freq: Every evening | ORAL | 0 refills | Status: DC | PRN

## 2019-05-16 MED ORDER — GABAPENTIN 300 MG PO CAPS: 300.0000 mg | ORAL_CAPSULE | Freq: Three times a day (TID) | ORAL | 0 refills | Status: AC

## 2019-05-16 MED ORDER — FLUOXETINE HCL 40 MG PO CAPS: 40.0000 mg | ORAL_CAPSULE | Freq: Every day | ORAL | 0 refills | Status: DC

## 2019-05-16 MED ORDER — PANTOPRAZOLE SODIUM 40 MG PO TBEC: 40.0000 mg | DELAYED_RELEASE_TABLET | Freq: Every day | ORAL | 0 refills | Status: DC

## 2019-05-16 MED ORDER — PROPRANOLOL HCL 40 MG PO TABS: 40.0000 mg | ORAL_TABLET | Freq: Two times a day (BID) | ORAL | 0 refills | Status: DC

## 2019-05-16 NOTE — Progress Notes (Signed)
Recreation Therapy Notes  Date:  6.17.20 Time: 0930 Location: [address] Dayroom  Group Topic: Stress Management  Goal Area(s) Addresses:  Patient will identify positive stress management techniques. Patient will identify benefits of using stress management post d/c.  Intervention:  Stress Management  Activity :  Meditation.  LRT introduced the stress management technique of meditation.  LRT played a meditation on letting go of the past and focusing on the present.  Patients were to follow along as the meditation was played to engage in activity.    Education:  Stress Management, Discharge Planning.   Education Outcome: Acknowledges Education  Clinical Observations/Feedback:  Pt did not attend group.     Victorino Sparrow, LRT/CTRS        Ria Comment, Maclovia Uher A 05/16/2019 11:22 AM

## 2019-05-16 NOTE — Discharge Summary (Signed)
Physician Discharge Summary Note  Patient:  Steven Daniels is an 58 y.o., male MRN:  [id number] DOB:  01/16/1961 Patient phone:  [phone] (home)  [place]   [place] [id number],  Total Time spent with patient: 15 minutes  Date of Admission:  05/09/2019 Date of Discharge: 05/16/19  Reason for Admission:  suicidal ideation with alcohol abuse  Principal Problem: <principal problem not specified> Discharge Diagnoses: Active Problems:   Severe recurrent major depression without psychotic features [facility](HCC)   Past Psychiatric History: Long history of alcohol use disorder with multiple hospitalizations for detox, most recently at [facility] July 2019 and discharged on Prozac, Seroquel, Neurontin, amantadine and propanolol. Denies history of suicide attempts. No clear history of mania. History of AH while withdrawing from alcohol only.  Past Medical History:  Past Medical History:  Diagnosis Date  . Alcohol abuse   . Bipolar affective disorder (HCC)   . Depression   . Hard of hearing   . Heart murmur    per pt 12/28/15  . Hepatitis C   . Hypertension   . Seizures (HCC)     Past Surgical History:  Procedure Laterality Date  . HIP FRACTURE SURGERY Right    x 3  . KNEE SURGERY Right    Family History:  Family History  Problem Relation Age of Onset  . Other Mother        brain tumor behind left ear  . Alzheimer's disease Mother   . Skin cancer Father        melatomia  . Breast cancer Maternal Grandmother   . Heart disease Paternal Uncle   . Heart disease Cousin        fathers side  . Colon cancer Neg Hx   . Esophageal cancer Neg Hx    Family Psychiatric  History: Denies Social History:  Social History   Substance and Sexual Activity  Alcohol Use Yes  . Frequency: Never   Comment: patient states he is in rehab     Social History   Substance and Sexual Activity  Drug Use Not Currently  . Types: Cocaine   Comment: crack, heroin -snort last use  12/27/2017    Social History   Socioeconomic History  . Marital status: Single    Spouse name: Not on file  . Number of children: 0  . Years of education: Not on file  . Highest education level: Not on file  Occupational History  . Occupation: disabled  Social Needs  . Financial resource strain: Not on file  . Food insecurity    Worry: Not on file    Inability: Not on file  . Transportation needs    Medical: Not on file    Non-medical: Not on file  Tobacco Use  . Smoking status: Current Every Day Smoker    Packs/day: 1.00    Types: Cigarettes  . Smokeless tobacco: Never Used  Substance and Sexual Activity  . Alcohol use: Yes    Frequency: Never    Comment: patient states he is in rehab  . Drug use: Not Currently    Types: Cocaine    Comment: crack, heroin -snort last use 12/27/2017  . Sexual activity: Not Currently    Birth control/protection: None  Lifestyle  . Physical activity    Days per week: Not on file    Minutes per session: Not on file  . Stress: Not on file  Relationships  . Social connections    Talks on phone: Not on  file    Gets together: Not on file    Attends religious service: Not on file    Active member of club or organization: Not on file    Attends meetings of clubs or organizations: Not on file    Relationship status: Not on file  Other Topics Concern  . Not on file  Social History Narrative   Divorced, no children   Veteran of [employer] and a graduate of [employer] college with a degree in radio TV and marketing formally worked in that [place]   Cocaine heroine and alcohol use.  States intention to quit currently started opiates after her first hip surgery   He is a smoker   11/23/2017       Hospital Course:  From admission H&P: Steven Daniels is a 58 year old male with history of depression, alcohol use disorder, hepatitis C, OSA, and HTN, presenting for treatment of suicidal ideation with alcohol abuse. He reports relapsing  on alcohol two weeks ago after a one year period of sobriety, with daily alcohol use (3 40s/day and liquor) since that time. He reports drinking until blackouts frequently. He had been staying in various sober living facilities. Admission Bal 105. UDS also positive for cocaine, although patient denies pattern of regular cocaine use. He reports starting Wellbutrin several weeks ago and reports increased severe depression since that time, with hypersomnia, fatigue, decreased appetite, isolating from others, nightmares, and suicidal ideation with no plan. He states "getting alcohol is the only thing that gets me out of bed." Denies HI/AVH. Denies SI. Reports withdrawal symptoms- diaphoresis, nausea, reflux, headache. Reports history of DTs but denies history of seizures. AST 119, ALT 179.  Steven Daniels was admitted for alcohol abuse with suicidal ideation. He was started on Ativan CIWA protocol for alcohol withdrawal, and Prozac, trazodone and gabapentin. He participated in group therapy on the unit. He remained on the [facility] unit for 7 days. He stabilized with medication and therapy. He was discharged on the medications listed below. He has shown improvement with improved mood, affect, sleep, appetite, and interaction. He denies any SI/HI/AVH and contracts for safety. He agrees to follow up at [facility] (see below). He is provided with prescriptions and medication samples upon discharge. His car is in the hospital parking lot, and he is discharging to a hotel.  Physical Findings: AIMS: Facial and Oral Movements Muscles of Facial Expression: None, normal Lips and Perioral Area: None, normal Jaw: None, normal Tongue: None, normal,Extremity Movements Upper (arms, wrists, hands, fingers): None, normal Lower (legs, knees, ankles, toes): None, normal, Trunk Movements Neck, shoulders, hips: None, normal, Overall Severity Severity of abnormal movements (highest score from questions above): None,  normal Incapacitation due to abnormal movements: None, normal Patient's awareness of abnormal movements (rate only patient's report): No Awareness, Dental Status Current problems with teeth and/or dentures?: No Does patient usually wear dentures?: No  CIWA:  CIWA-Ar Total: 0 COWS:     Musculoskeletal: Strength & Muscle Tone: within normal limits Gait & Station: normal Patient leans: N/A  Psychiatric Specialty Exam: Physical Exam  Nursing note and vitals reviewed. Constitutional: He is oriented to person, place, and time. He appears well-developed and well-nourished.  Cardiovascular: Normal rate.  Respiratory: Effort normal.  Neurological: He is alert and oriented to person, place, and time.    Review of Systems  Constitutional: Negative.   Psychiatric/Behavioral: Positive for substance abuse. Negative for depression, hallucinations and suicidal ideas. The patient is not nervous/anxious and does not  have insomnia.     Blood pressure 130/70, pulse 95, temperature 97.7 F (36.5 C), temperature source Oral, resp. rate 16, height 6\' 3"  (1.905 m), weight 127 kg, SpO2 92 %.Body mass index is 35 kg/m.  See MD's discharge SRA     Have you used any form of tobacco in the last 30 days? (Cigarettes, Smokeless Tobacco, Cigars, and/or Pipes): Yes  Has this patient used any form of tobacco in the last 30 days? (Cigarettes, Smokeless Tobacco, Cigars, and/or Pipes) Yes, a prescription for an FDA-approved medication for tobacco cessation was offered at discharge.   Blood Alcohol level:  Lab Results  Component Value Date   ETH 105 (H) 05/08/2019   ETH 28 (H) 02/18/2018    Metabolic Disorder Labs:  Lab Results  Component Value Date   HGBA1C 6.0 (H) 05/10/2019   MPG 125.5 05/10/2019   MPG 114.02 02/20/2018   No results found for: PROLACTIN Lab Results  Component Value Date   CHOL 192 02/20/2018   TRIG 142 02/20/2018   HDL 79 02/20/2018   CHOLHDL 2.4 02/20/2018   VLDL 28 02/20/2018    LDLCALC 85 02/20/2018   LDLCALC 124 (H) 12/30/2015    See Psychiatric Specialty Exam and Suicide Risk Assessment completed by Attending Physician prior to discharge.  Discharge destination:  Home  Is patient on multiple antipsychotic therapies at discharge:  No   Has Patient had three or more failed trials of antipsychotic monotherapy by history:  No  Recommended Plan for Multiple Antipsychotic Therapies: NA  Discharge Instructions    Discharge instructions   Complete by: As directed    Patient is instructed to take all prescribed medications as recommended. Report any side effects or adverse reactions to your outpatient psychiatrist. Patient is instructed to abstain from alcohol and illegal drugs while on prescription medications. In the event of worsening symptoms, patient is instructed to call the crisis hotline, 911, or go to the nearest emergency department for evaluation and treatment.     Allergies as of 05/16/2019      Reactions   Other Shortness Of Breath   Reaction to cat dander   Apple Swelling, Other (See Comments)   Raw apples cause gum swelling   Cherry Swelling, Other (See Comments)   Raw cherries cause gum swelling   Plum Pulp Swelling, Other (See Comments)   Raw plums cause gum swelling   Lisinopril Hives, Rash   Penicillins Rash   Allergic reaction as a 58 year old Has patient had a PCN reaction causing immediate rash, facial/tongue/throat swelling, SOB or lightheadedness with hypotension: Yes Has patient had a PCN reaction causing severe rash involving mucus membranes or skin necrosis: No Has patient had a PCN reaction that required hospitalization: No Has patient had a PCN reaction occurring within the last 10 years: No If all of the above answers are "NO", then may proceed with Cephalosporin use.      Medication List    STOP taking these medications   benzonatate 100 MG capsule Commonly known as: TESSALON   cetirizine 10 MG tablet Commonly  known as: ZYRTEC   guaiFENesin 600 MG 12 hr tablet Commonly known as: Mucinex   ibuprofen 600 MG tablet Commonly known as: ADVIL   Laxative 25 MG Tabs Generic drug: Sennosides   loperamide 2 MG tablet Commonly known as: IMODIUM A-D   Mylanta 200-200-20 MG/5ML suspension Generic drug: alum & mag hydroxide-simeth   Nausea Control 1.87-1.87-21.5 Soln   ondansetron 8 MG tablet Commonly known  as: ZOFRAN   predniSONE 10 MG tablet Commonly known as: DELTASONE   umeclidinium-vilanterol 62.5-25 MCG/INH Aepb Commonly known as: Anoro Ellipta   Wellbutrin XL 300 MG 24 hr tablet Generic drug: buPROPion     TAKE these medications     Indication  albuterol 108 (90 Base) MCG/ACT inhaler Commonly known as: VENTOLIN HFA Inhale 2 puffs into the lungs 4 (four) times daily as needed for wheezing or shortness of breath. What changed: Another medication with the same name was removed. Continue taking this medication, and follow the directions you see here.  Indication: Asthma   FLUoxetine 40 MG capsule Commonly known as: PROZAC Take 1 capsule (40 mg total) by mouth daily.  Indication: Major Depressive Disorder   gabapentin 300 MG capsule Commonly known as: NEURONTIN Take 1 capsule (300 mg total) by mouth 3 (three) times daily. What changed:   when to take this  additional instructions  Indication: Alcohol Withdrawal Syndrome   Multiple Vitamin-Folic Acid Tabs Take 1 tablet by mouth daily with lunch.  Indication: Supplementation   nicotine 21 mg/24hr patch Commonly known as: NICODERM CQ - dosed in mg/24 hours Place 1 patch (21 mg total) onto the skin daily.  Indication: Nicotine Addiction   pantoprazole 40 MG tablet Commonly known as: PROTONIX Take 1 tablet (40 mg total) by mouth daily. Start taking on: May 17, 2019  Indication: Gastroesophageal Reflux Disease   propranolol 40 MG tablet Commonly known as: INDERAL Take 1 tablet (40 mg total) by mouth 2 (two) times  daily. What changed:   medication strength  how much to take  when to take this  Indication: High Blood Pressure Disorder   thiamine 100 MG tablet Commonly known as: VITAMIN B-1 Take 100 mg by mouth daily.  Indication: Supplementation   traZODone 50 MG tablet Commonly known as: DESYREL Take 1 tablet (50 mg total) by mouth at bedtime as needed for sleep. What changed:   when to take this  reasons to take this  Indication: Trouble Sleeping      Follow-up Information    [facility] Follow up on 05/22/2019.   Why: Please follow up with clinic for a medication management appointment after your discharge from residential treatment.  Contact information: [address] [place] [address] [phone]        [facility] Follow up on 05/17/2019.   Why: Please attend your screening for possible admission on Thursday, 6/18 at 7:45a.  Be sure to bring photo ID, proof of [facility] [place] [place], 30-day supply of medications and 2 weeks worth of clothing.  Contact information: [place] [place] [address] [phone]           Follow-up recommendations: Activity as tolerated. Diet as recommended by primary care physician. Keep all scheduled follow-up appointments as recommended.   Comments:   Patient is instructed to take all prescribed medications as recommended. Report any side effects or adverse reactions to your outpatient psychiatrist. Patient is instructed to abstain from alcohol and illegal drugs while on prescription medications. In the event of worsening symptoms, patient is instructed to call the crisis hotline, 911, or go to the nearest emergency department for evaluation and treatment.  Signed: Aldean BakerJanet E Jiovanna Frei, NP 05/16/2019, 2:14 PM

## 2019-05-16 NOTE — Progress Notes (Signed)
Nursing Progress Note: 7p-7a D: Pt currently presents with a animated/anxious/loud affect and behavior. Pt states "I am having problems. I have a fork stuck in my teeth. Get a pair of pliers and pull it out." Interacting minimally with the milieu. Pt reports fair sleep during the previous night with current medication regimen.   A: Pt provided with medications per providers orders. Pt's labs and vitals were monitored throughout the night. Pt supported emotionally and encouraged to express concerns and questions. Pt educated on medications.  R: Pt's safety ensured with 15 minute and environmental checks. Pt currently denies SI, HI, and AVH. Pt verbally contracts to seek staff if SI,HI, or AVH occurs and to consult with staff before acting on any harmful thoughts. Will continue to monitor.    [place] NOVEL CORONAVIRUS (COVID-19) DAILY CHECK-OFF SYMPTOMS - answer yes or no to each - every day NO YES  Have you had a fever in the past 24 hours?  . Fever (Temp > 37.80C / 100F) X   Have you had any of these symptoms in the past 24 hours? . New Cough .  Sore Throat  .  Shortness of Breath .  Difficulty Breathing .  Unexplained Body Aches   X   Have you had any one of these symptoms in the past 24 hours not related to allergies?   . Runny Nose .  Nasal Congestion .  Sneezing   X   If you have had runny nose, nasal congestion, sneezing in the past 24 hours, has it worsened?  X   EXPOSURES - check yes or no X   Have you traveled outside the state in the past 14 days?  X   Have you been in contact with someone with a confirmed diagnosis of COVID-19 or PUI in the past 14 days without wearing appropriate PPE?  X   Have you been living in the same home as a person with confirmed diagnosis of COVID-19 or a PUI (household contact)?    X   Have you been diagnosed with COVID-19?    X              What to do next: Answered NO to all: Answered YES to anything:   Proceed with unit schedule Follow  the [facility] Inpatient Flowsheet.

## 2019-05-16 NOTE — [facility] Suicide Risk Assessment (Signed)
[facility] Discharge Suicide Risk Assessment   Principal Problem: <principal problem not specified> Discharge Diagnoses: Active Problems:   Severe recurrent major depression without psychotic features ([place])   Total Time spent with patient: 15 minutes  Musculoskeletal: Strength & Muscle Tone: within normal limits Gait & Station: normal Patient leans: N/A  Psychiatric Specialty Exam: Review of Systems  All other systems reviewed and are negative.   Blood pressure 110/65, pulse 86, temperature 97.7 F (36.5 C), temperature source Oral, resp. rate 16, height 6\' 3"  (1.905 m), weight 127 kg, SpO2 92 %.Body mass index is 35 kg/m.  General Appearance: Casual  Eye Contact::  Good  Speech:  Normal Rate409  Volume:  Normal  Mood:  Anxious  Affect:  Congruent  Thought Process:  Coherent and Descriptions of Associations: Intact  Orientation:  Full (Time, Place, and Person)  Thought Content:  Logical  Suicidal Thoughts:  No  Homicidal Thoughts:  No  Memory:  Immediate;   Fair Recent;   Fair Remote;   Fair  Judgement:  Intact  Insight:  Fair  Psychomotor Activity:  Normal  Concentration:  Fair  Recall:  [employer] of Knowledge:Fair  Language: Good  Akathisia:  Negative  Handed:  Right  AIMS (if indicated):     Assets:  Desire for Improvement Resilience  Sleep:  Number of Hours: 5.5  Cognition: WNL  ADL's:  Intact   Mental Status Per Nursing Assessment::   On Admission:  Suicidal ideation indicated by patient, Plan includes specific time, place, or method  Demographic Factors:  Male, Divorced or widowed, Caucasian, Low socioeconomic status and Unemployed  Loss Factors: Decline in physical health  Historical Factors: Impulsivity  Risk Reduction Factors:   Positive coping skills or problem solving skills  Continued Clinical Symptoms:  Depression:   Comorbid alcohol abuse/dependence Impulsivity Alcohol/Substance Abuse/Dependencies  Cognitive Features That Contribute To  Risk:  None    Suicide Risk:  Minimal: No identifiable suicidal ideation.  Patients presenting with no risk factors but with morbid ruminations; may be classified as minimal risk based on the severity of the depressive symptoms  Follow-up Information    [facility] Follow up on 05/22/2019.   Why: Hospital follow up appointment with Josph Macho is Tuesday, 6/23 at 12:45p.  Please bring your current medications and discharge paperwork from this hospitalization.  Contact information: [address] [place] [address] [phone]           Plan Of Care/Follow-up recommendations:  Activity:  ad lib  Sharma Covert, MD 05/16/2019, 7:45 AM

## 2019-05-16 NOTE — Progress Notes (Signed)
  [facility] Adult Case Management Discharge Plan :  Will you be returning to the same living situation after discharge:  No. Pt plans to stay in a motel tonight and then follow up at [facility] on 6/18. At discharge, do you have transportation home?: Yes,  pt's car is on campus Do you have the ability to pay for your medications: Yes,  pt has insurance  Release of information consent forms completed and in the chart;  Patient's signature needed at discharge.  Patient to Follow up at: Follow-up Information    [facility] Follow up on 05/22/2019.   Why: Please follow up with clinic for a medication management appointment after your discharge from residential treatment.  Contact information: [place] [address] [phone]        [facility] Follow up on 05/17/2019.   Why: Please attend your screening for possible admission on Thursday, 6/18 at 7:45a.  Be sure to bring photo ID, proof of [facility] residency, 30-day supply of medications and 2 weeks worth of clothing.  Contact information: [place] [address] [phone]           Next level of care provider has access to [place] and Suicide Prevention discussed: Yes,  Threasa Heads, sister  Have you used any form of tobacco in the last 30 days? (Cigarettes, Smokeless Tobacco, Cigars, and/or Pipes): Yes  Has patient been referred to the Quitline?: N/A patient is not a smoker  Patient has been referred for addiction treatment: [place], LCSW 05/16/2019, 9:27 AM

## 2019-05-16 NOTE — Progress Notes (Signed)
Patient ID: Steven Daniels, male   DOB: [birth year], 58 y.o.   MRN: [id number] Pt d/c to "motel ". D/c instructions and medications given and reviewed. Pt verbalizes understanding.

## 2019-05-16 NOTE — Care Management (Signed)
CMA spoke with Admission Coordinator, June at [facility]. Patient has been accepted for a screening for possible admission on Thursday, 6/18 at 7:45a.  Patient will discharge Wednesday, 6/17 to a motel then go to [facility] Thursday morning.   CMA has notified Education officer, museum, Conservation officer, nature.     Rosaelena Kemnitz Care Management Assistant  Email:[email or web address] Office: [phone]

## 2019-06-13 ENCOUNTER — Telehealth (INDEPENDENT_AMBULATORY_CARE_PROVIDER_SITE_OTHER): Payer: Self-pay | Admitting: Primary Care

## 2019-06-19 ENCOUNTER — Encounter (INDEPENDENT_AMBULATORY_CARE_PROVIDER_SITE_OTHER): Payer: Self-pay | Admitting: Primary Care

## 2019-06-19 ENCOUNTER — Other Ambulatory Visit: Payer: Self-pay

## 2019-06-19 ENCOUNTER — Telehealth (INDEPENDENT_AMBULATORY_CARE_PROVIDER_SITE_OTHER): Payer: Self-pay | Admitting: Primary Care

## 2019-06-19 DIAGNOSIS — Z7689 Persons encountering health services in other specified circumstances: Secondary | ICD-10-CM

## 2019-06-19 DIAGNOSIS — F1910 Other psychoactive substance abuse, uncomplicated: Secondary | ICD-10-CM

## 2019-06-19 DIAGNOSIS — I10 Essential (primary) hypertension: Secondary | ICD-10-CM

## 2019-06-19 DIAGNOSIS — F1021 Alcohol dependence, in remission: Secondary | ICD-10-CM

## 2019-06-20 DIAGNOSIS — I10 Essential (primary) hypertension: Secondary | ICD-10-CM | POA: Insufficient documentation

## 2019-06-20 NOTE — Progress Notes (Signed)
Virtual Visit via Telephone Note  I connected with Steven Daniels on 06/20/19 at 10:30 AM EDT by telephone and verified that I am speaking with the correct person using two identifiers.   I discussed the limitations, risks, security and privacy concerns of performing an evaluation and management service by telephone and the availability of in person appointments. I also discussed with the patient that there may be a patient responsible charge related to this service. The patient expressed understanding and agreed to proceed.   History of Present Illness: [name] is having a web visit to establish primary care. He is presently in a rehab program for drug abuse. Per patient and staff he is doing very well. He denies shortness of breath, headaches, chest pain or lower extremity edema. Medication will be re-evaluated, adjusted and refilled.  Past Medical History:  Diagnosis Date  . Alcohol abuse   . Bipolar affective disorder ([place])   . Depression   . Hard of hearing   . Heart murmur    per pt 12/28/15  . Hepatitis C   . Hypertension   . Seizures ([place])   Review of Systems  All other systems reviewed and are negative.  Current Outpatient Medications on File Prior to Visit  Medication Sig Dispense Refill  . albuterol (PROVENTIL HFA;VENTOLIN HFA) 108 (90 Base) MCG/ACT inhaler Inhale 2 puffs into the lungs 4 (four) times daily as needed for wheezing or shortness of breath. 1 Inhaler 3  . FLUoxetine (PROZAC) 40 MG capsule Take 1 capsule (40 mg total) by mouth daily. 30 capsule 0  . gabapentin (NEURONTIN) 300 MG capsule Take 1 capsule (300 mg total) by mouth 3 (three) times daily. 90 capsule 0  . Multiple Vitamin-Folic Acid TABS Take 1 tablet by mouth daily with lunch.     . nicotine (NICODERM CQ - DOSED IN MG/24 HOURS) 21 mg/24hr patch Place 1 patch (21 mg total) onto the skin daily. 28 patch 0  . pantoprazole (PROTONIX) 40 MG tablet Take 1 tablet (40 mg total) by mouth daily. 30 tablet 0   . propranolol (INDERAL) 40 MG tablet Take 1 tablet (40 mg total) by mouth 2 (two) times daily. 60 tablet 0  . thiamine (VITAMIN B-1) 100 MG tablet Take 100 mg by mouth daily.    . traZODone (DESYREL) 50 MG tablet Take 1 tablet (50 mg total) by mouth at bedtime as needed for sleep. 30 tablet 0   No current facility-administered medications on file prior to visit.    Diagnoses and all orders for this visit:  Encounter to establish care  [name] is establishing care for primary care provider that can manage medication while in a facility for treatment of drug abuse.  Alcohol dependence in remission [facility]) Patient is inpatient for alcohol abuse and has done well arrangements are being made to go into the next day of recovery.  Polysubstance abuse ([place]) Drug of choice was cocaine in patient rehab for cocaine abuse.  Presented to the emergency room on May 07, 2024 suicidal ideations, and out of rehab to stop drinking alcohol, smoking crack and using cocaine he agreed to being discharge to a inpatient facility  Hypertension, unspecified type Counseled on blood pressure goal of less than 130/80, low-sodium, DASH diet, medication compliance, 150 minutes of moderate intensity exercise per week. Discussed medication compliance, adverse effects.  Time spent 22 minutes with nurse ,patient and reviewing chart

## 2019-06-22 IMAGING — CT CT ANGIO CHEST
2 of 9 series · 18 of 46 positions shown · IV contrast (iopamidol)
Comparison: 12/20/2018

CLINICAL DATA: Chest pain, rule out PE

EXAM:
CT ANGIOGRAPHY CHEST WITH CONTRAST
TECHNIQUE: Multidetector CT imaging of the chest was performed using the
standard protocol during bolus administration of intravenous
contrast. Multiplanar CT image reconstructions and MIPs were
obtained to evaluate the vascular anatomy.
CONTRAST:  <See Chart> [id number] IOPAMIDOL (WX1SEL-NE4) INJECTION
76%

[Series 6: thins · axial · 0.97mm/px · z∈[+1089,+1378]mm · 15 of 329 slices shown]
[im 20/329  lung]
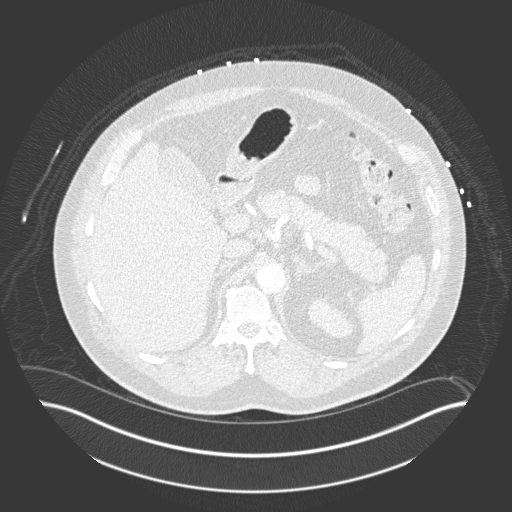
[im 39/329  soft-tissue]
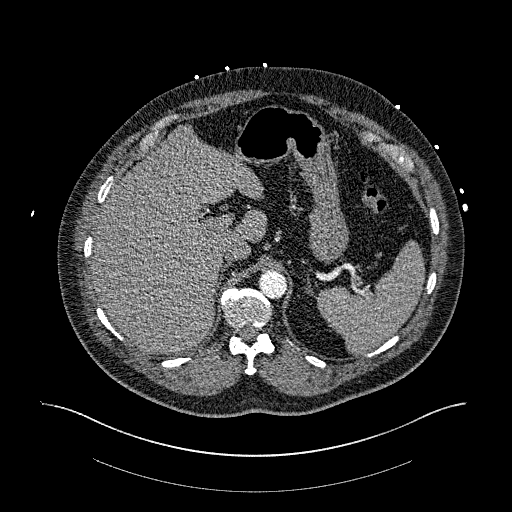
[im 58/329  lung]
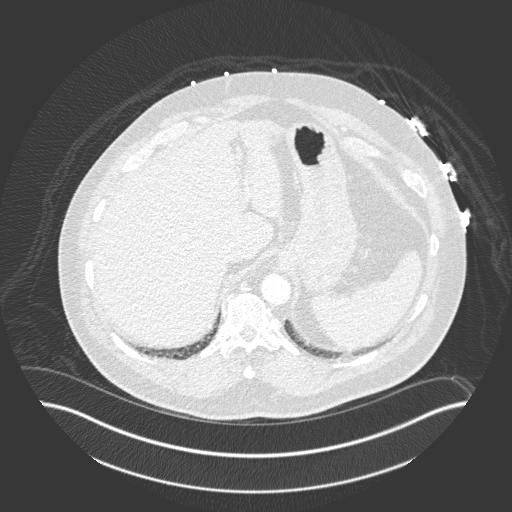
[im 78/329  soft-tissue]
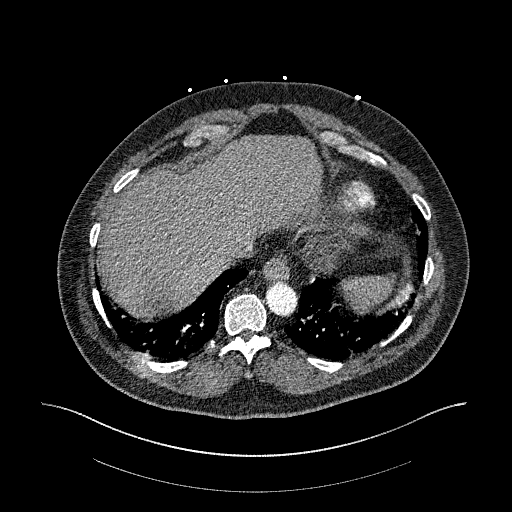
[im 97/329  lung]
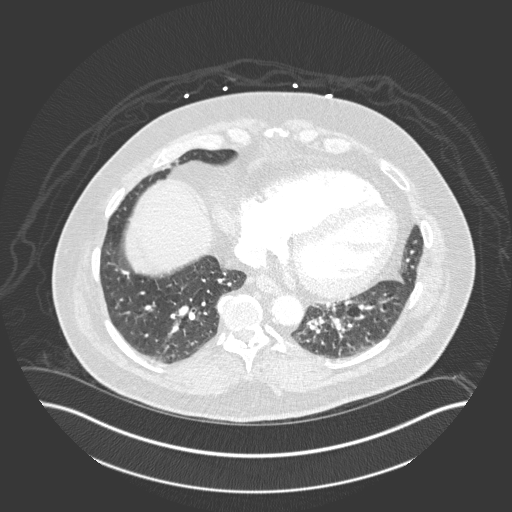
[im 116/329  soft-tissue]
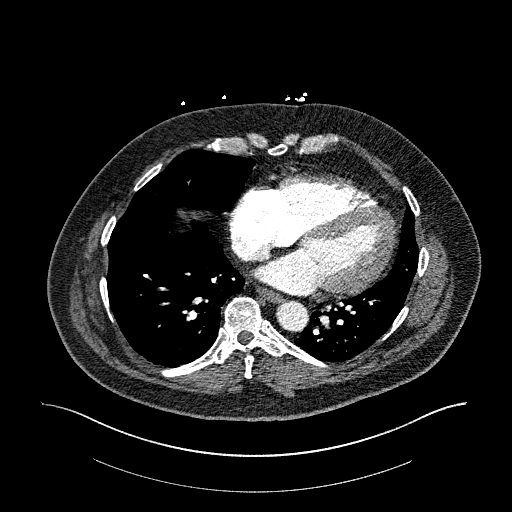
[im 136/329  lung]
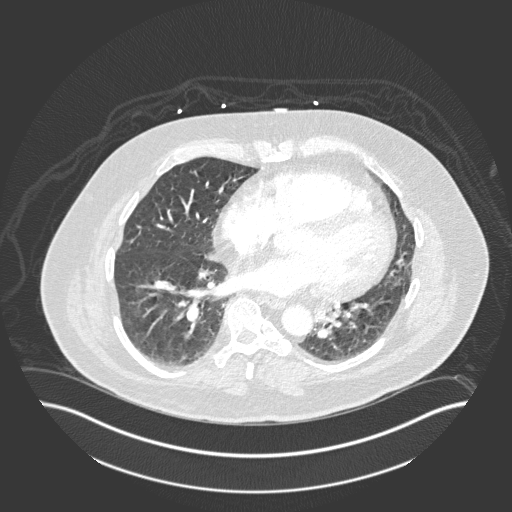
[im 174/329  soft-tissue]
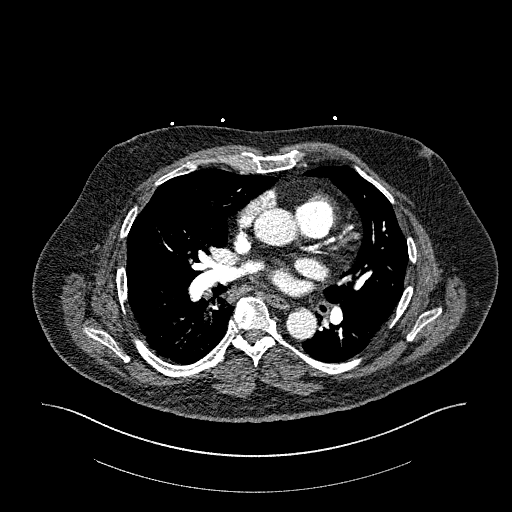
[im 193/329  lung]
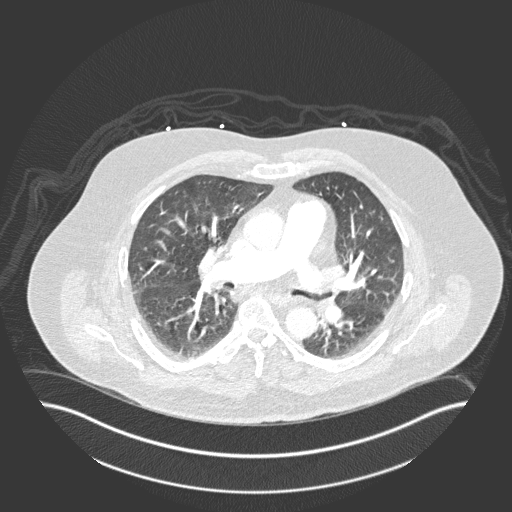
[im 213/329  soft-tissue]
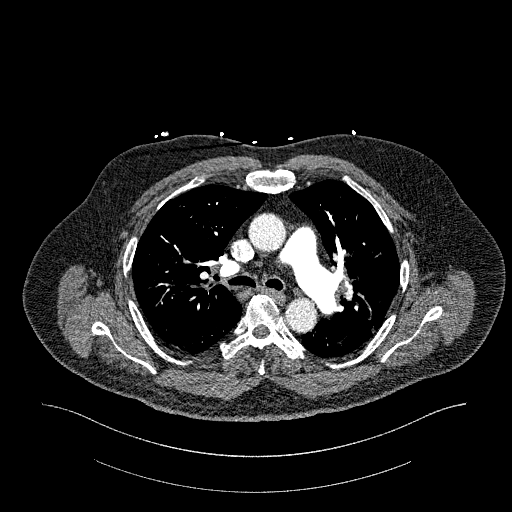
[im 232/329  lung]
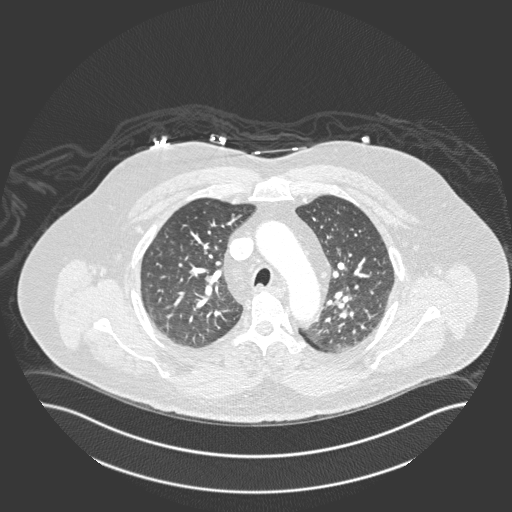
[im 251/329  soft-tissue]
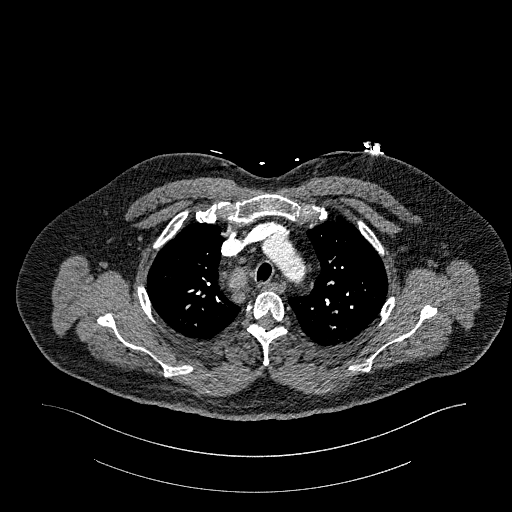
[im 271/329  lung]
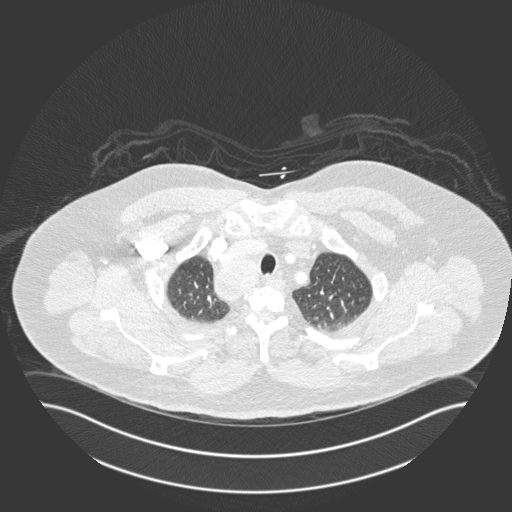
[im 290/329  soft-tissue]
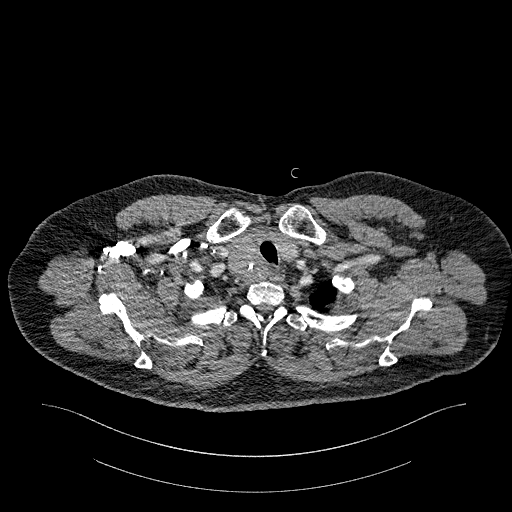
[im 309/329  lung]
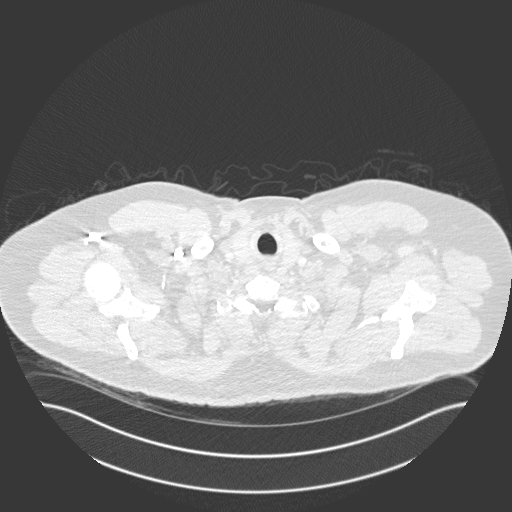

[Series 8: coronal mpr · coronal · 0.63mm/px · 3 of 151 slices shown]
[im 38/151  soft-tissue]
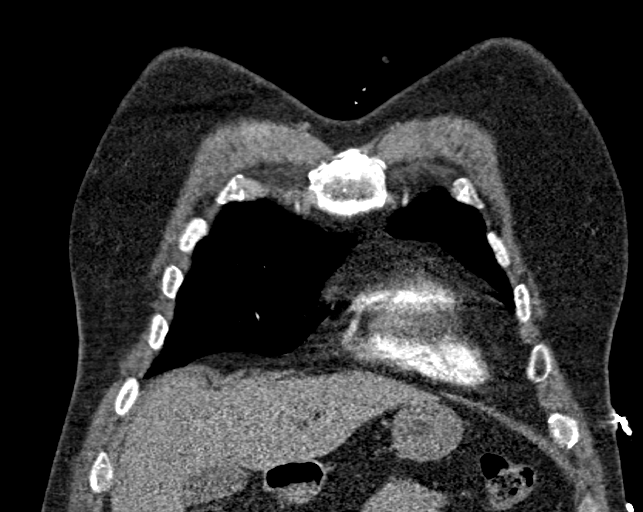
[im 76/151  soft-tissue]
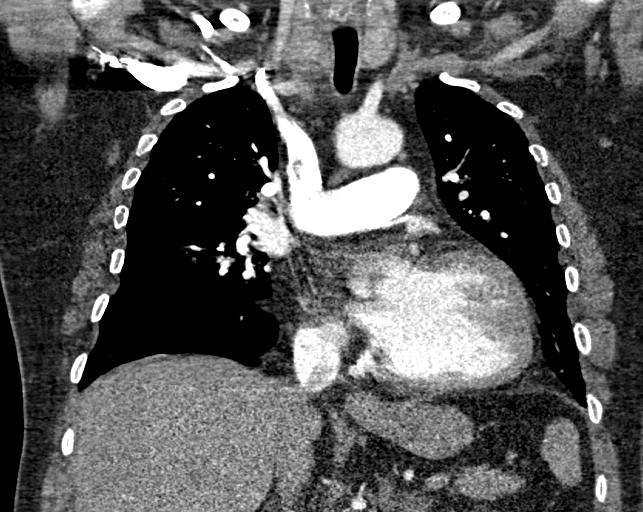
[im 113/151  soft-tissue]
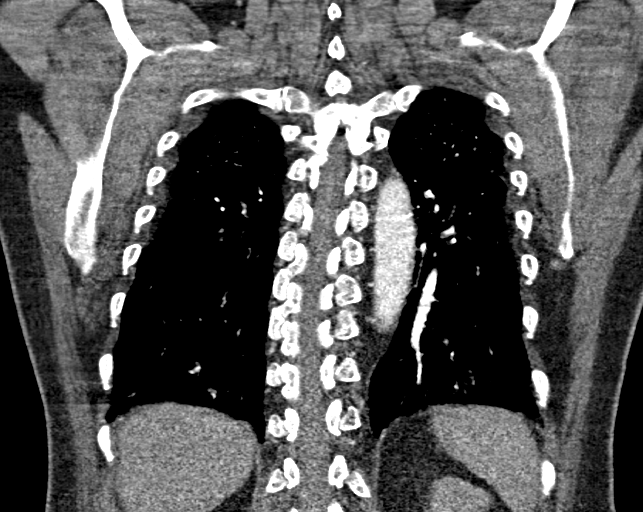

[18 of 46 positions shown; findings below may reference images not displayed]

FINDINGS: Cardiovascular: Satisfactory opacification of the pulmonary arteries
to the segmental level. No evidence of pulmonary embolism. Scattered
coronary artery calcifications.. Cardiomegaly. No pericardial
effusion.

Mediastinum/Nodes: No enlarged mediastinal, hilar, or axillary lymph
nodes. Redemonstrated large exophytic nodule of the inferior pole of
the right lobe of the thyroid measuring at least 6.3 cm extending
into the superior mediastinum. Trachea and esophagus demonstrate no
significant findings.

Lungs/Pleura: Bibasilar scarring or atelectasis. Occasional tiny
pulmonary nodules as seen on prior examination, for example a 2 mm
nodule of the right upper lobe (series 7, image 29). No pleural
effusion or pneumothorax.

Upper Abdomen: No acute abnormality.

Musculoskeletal: No chest wall abnormality. No acute or significant
osseous findings.

Review of the MIP images confirms the above findings.
IMPRESSION: 1.  Negative examination for pulmonary embolism.

2.  Cardiomegaly.

3. Redemonstrated large exophytic nodule of the inferior pole of the
right lobe of the thyroid measuring at least 6.3 cm extending into
the superior mediastinum. As on prior examination, recommend
nonemergent ultrasound examination of the thyroid to further
characterize.

4. Occasional tiny pulmonary nodules as seen on prior CT. As on
prior, CT follow-up is optional for these nodules at 12 months if
high risk factors for lung cancer are present.

## 2019-06-24 IMAGING — US US THYROID
1 series · 13 of 25 positions shown · non-contrast
Comparison: Chest CT-01/10/2019

CLINICAL DATA: Incidental on CT. Thyroid nodules incidentally noted
on chest CT performed 01/10/2019

EXAM:
THYROID ULTRASOUND
TECHNIQUE: Ultrasound examination of the thyroid gland and adjacent soft
tissues was performed.

[Series 1: us thyroid · 13 of 26 slices shown]
[im 1/26]
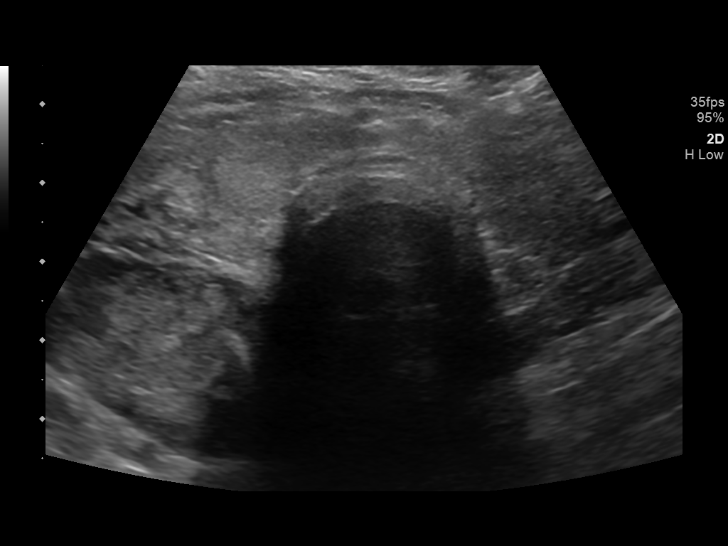
[im 3/26]
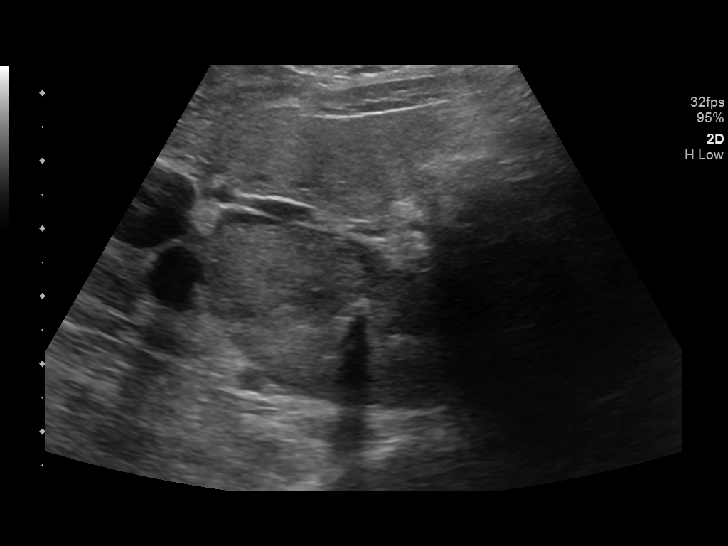
[im 5/26]
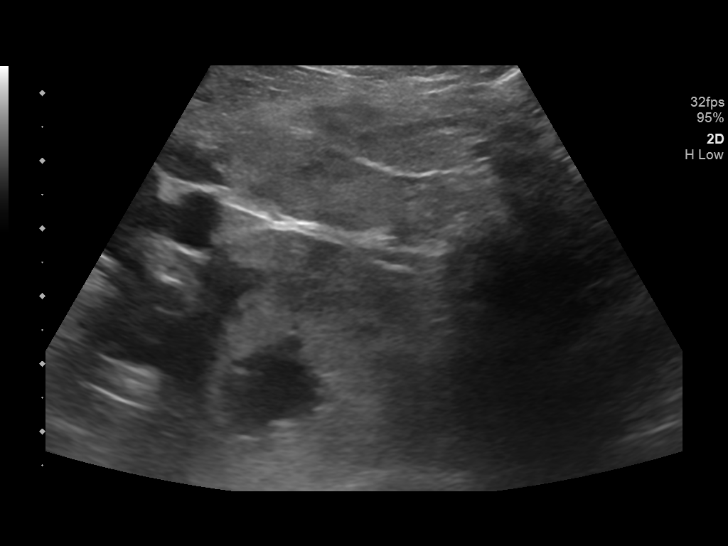
[im 7/26]
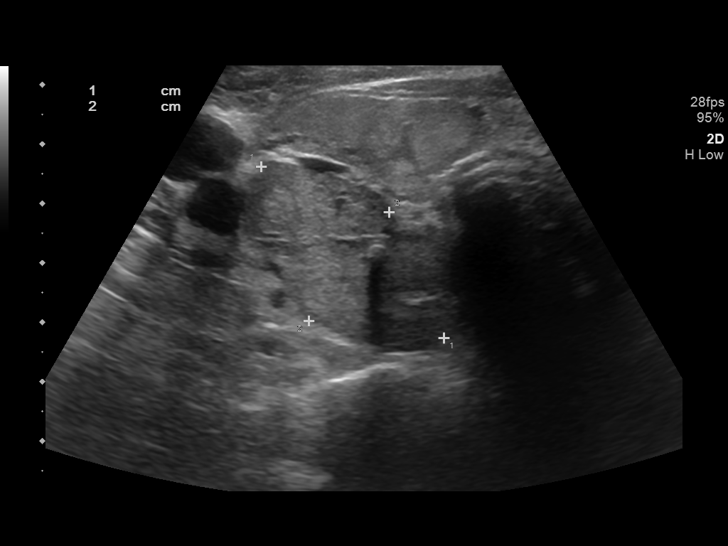
[im 9/26]
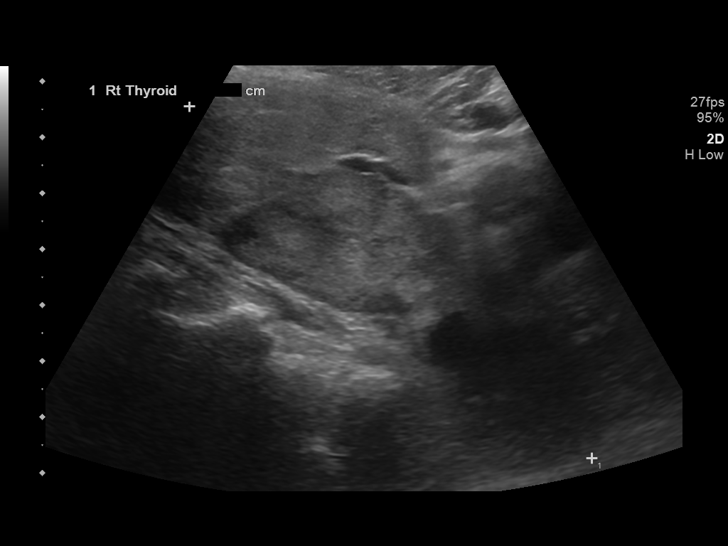
[im 11/26]
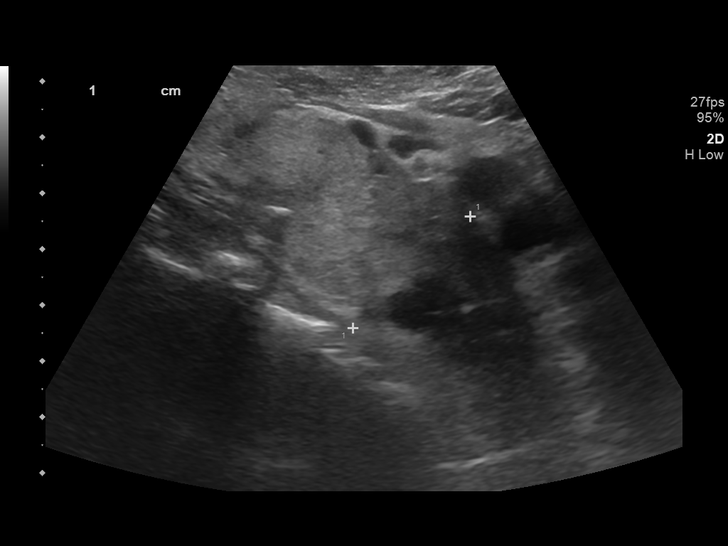
[im 13/26]
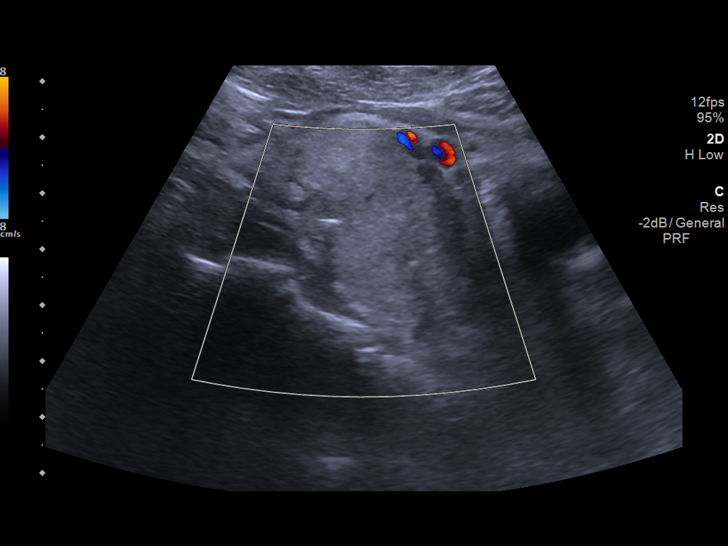
[im 15/26]
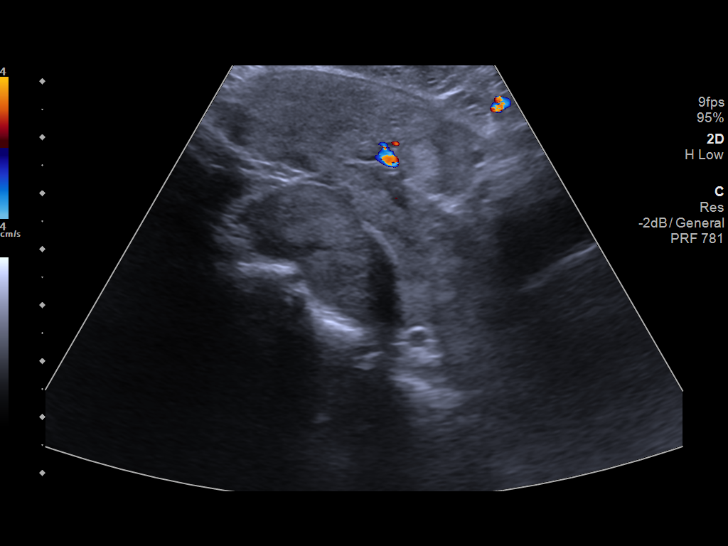
[im 17/26]
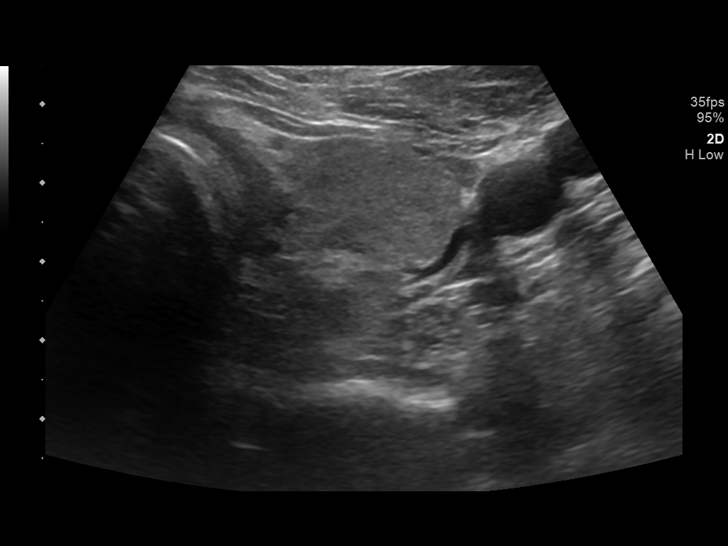
[im 19/26]
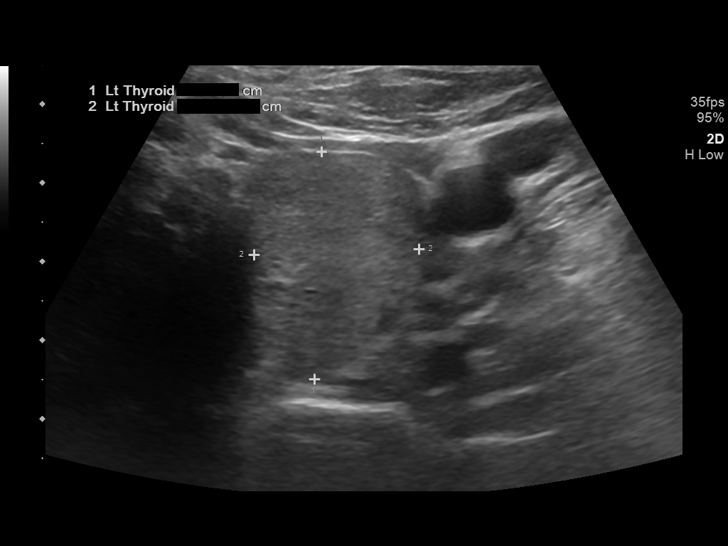
[im 21/26]
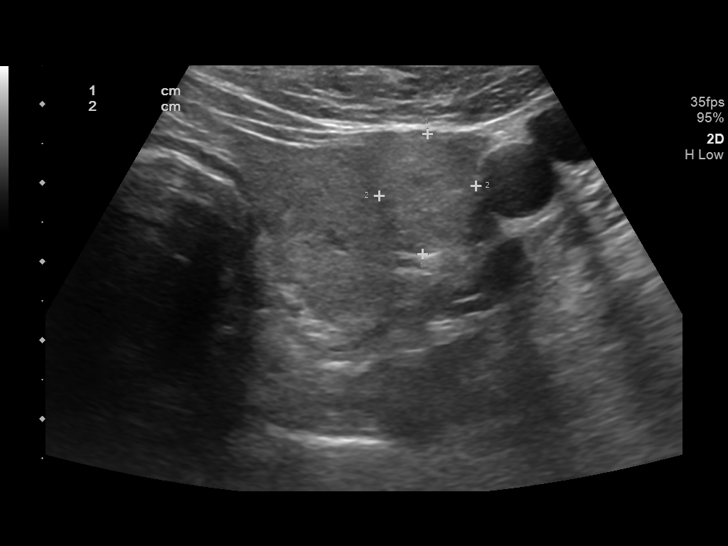
[im 23/26]
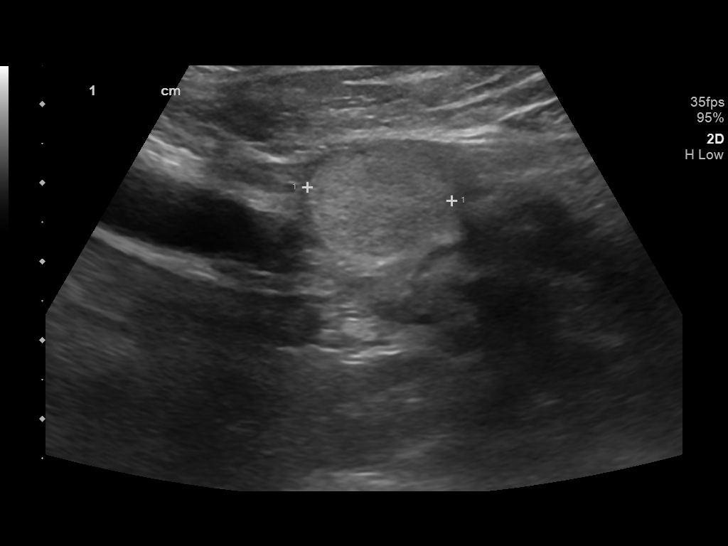
[im 26/26]
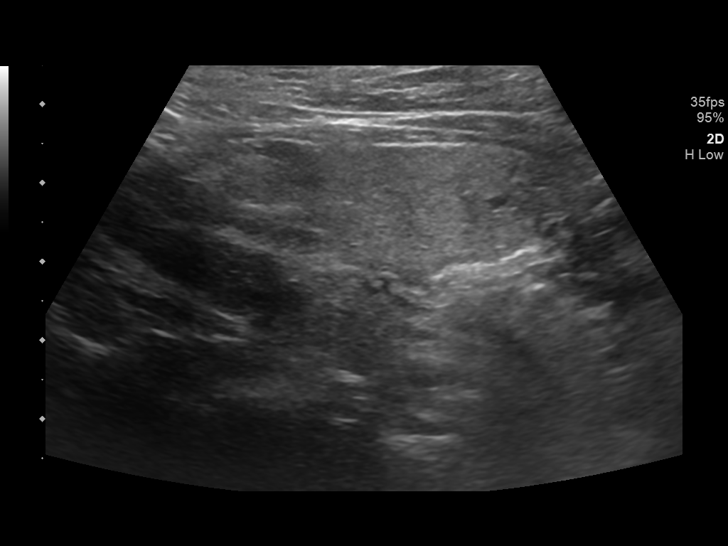

[13 of 25 positions shown; findings below may reference images not displayed]

FINDINGS: Parenchymal Echotexture: Moderately heterogenous

Isthmus: Normal in size measures 0.4 cm in diameter

Right lobe: Enlarged measuring 9.6 x 4.3 x 3.5 cm

Left lobe: Enlarged measuring 5.2 x 2.9 x 2.1 cm.

_________________________________________________________

Estimated total number of nodules >/= 1 cm: 3

Number of spongiform nodules >/=  2 cm not described below (TR1): 0

Number of mixed cystic and solid nodules >/= 1.5 cm not described
below (TR2): 0

_________________________________________________________

Nodule # 1:

Location: Right; Inferior

Maximum size: 6.0 cm; Other 2 dimensions: 3.2 x 2.9 cm

Composition: solid/almost completely solid (2)

Echogenicity: hypoechoic (2)

Shape: not taller-than-wide (0)

Margins: lobulated/irregular (2)

Echogenic foci: none (0)

ACR TI-RADS total points: 6.

ACR TI-RADS risk category: TR4 (4-6 points).

ACR TI-RADS recommendations:

**Given size (>/= 1.5 cm) and appearance, fine needle aspiration of
this moderately suspicious nodule should be considered based on
TI-RADS criteria.

_________________________________________________________

Nodule # 2:

Location: Right; Mid

Maximum size: 4.2 cm; Other 2 dimensions: 2.3 x 2.3 cm

Composition: solid/almost completely solid (2)

Echogenicity: isoechoic (1)

Shape: not taller-than-wide (0)

Margins: lobulated/irregular (2)

Echogenic foci: macrocalcifications (1)

ACR TI-RADS total points: 6.

ACR TI-RADS risk category: TR4 (4-6 points).

ACR TI-RADS recommendations:

**Given size (>/= 1.5 cm) and appearance, fine needle aspiration of
this moderately suspicious nodule should be considered based on
TI-RADS criteria.

_________________________________________________________

Nodule # 3:

Location: Left; Mid

Maximum size: 1.8 cm; Other 2 dimensions: 1.5 x 1.4 cm

Composition: solid/almost completely solid (2)

Echogenicity: hyperechoic (1)

Shape: not taller-than-wide (0)

Margins: ill-defined (0)

Echogenic foci: none (0)

ACR TI-RADS total points: 3.

ACR TI-RADS risk category: TR3 (3 points).

ACR TI-RADS recommendations:

*Given size (>/= 1.5 - 2.4 cm) and appearance, a follow-up
ultrasound in 1 year should be considered based on TI-RADS criteria.

_________________________________________________________
IMPRESSION: 1. Findings suggestive of multinodular goiter.
2. Nodules #1 and #2 both meet imaging criteria to recommend
percutaneous sampling as clinically indicated.
3. Nodule #3 meets imaging criteria to recommend a 1 year follow-up.

The above is in keeping with the ACR TI-RADS recommendations - [HOSPITAL] 4331;[DATE].

## 2019-06-29 ENCOUNTER — Telehealth (INDEPENDENT_AMBULATORY_CARE_PROVIDER_SITE_OTHER): Payer: Self-pay

## 2019-06-29 NOTE — Telephone Encounter (Signed)
FWD to PCP. Tempestt S Roberts, CMA  

## 2019-06-29 NOTE — Telephone Encounter (Signed)
Steven Daniels from [facility] calledto request medication refill for   pantoprazole (PROTONIX) 40 MG tablet   Patient uses [facility] [address] [place], [place] Phone [phone]   Please advice Steven [phone] Ext:  [phone]

## 2019-07-02 ENCOUNTER — Other Ambulatory Visit (INDEPENDENT_AMBULATORY_CARE_PROVIDER_SITE_OTHER): Payer: Self-pay | Admitting: Primary Care

## 2019-07-02 MED ORDER — PANTOPRAZOLE SODIUM 40 MG PO TBEC: 40.0000 mg | DELAYED_RELEASE_TABLET | ORAL | 1 refills | Status: DC | PRN

## 2019-07-02 NOTE — Telephone Encounter (Signed)
sent 

## 2019-07-06 ENCOUNTER — Other Ambulatory Visit: Payer: Self-pay

## 2019-07-06 ENCOUNTER — Ambulatory Visit: Payer: Self-pay | Attending: Family Medicine | Admitting: Family Medicine

## 2019-07-26 ENCOUNTER — Telehealth (INDEPENDENT_AMBULATORY_CARE_PROVIDER_SITE_OTHER): Payer: Self-pay

## 2019-07-26 MED ORDER — PANTOPRAZOLE SODIUM 40 MG PO TBEC: 40.0000 mg | DELAYED_RELEASE_TABLET | ORAL | 1 refills | Status: DC | PRN

## 2019-07-26 NOTE — Telephone Encounter (Signed)
Patient called to request refill of protonix sent to [facility]. Refill was sent. Patient is aware. Nat Christen, CMA

## 2019-07-27 ENCOUNTER — Other Ambulatory Visit (INDEPENDENT_AMBULATORY_CARE_PROVIDER_SITE_OTHER): Payer: Self-pay | Admitting: Primary Care

## 2019-07-27 ENCOUNTER — Telehealth (INDEPENDENT_AMBULATORY_CARE_PROVIDER_SITE_OTHER): Payer: Self-pay

## 2019-07-27 MED ORDER — FLUOXETINE HCL 40 MG PO CAPS: 40.0000 mg | ORAL_CAPSULE | Freq: Every day | ORAL | 3 refills | Status: AC

## 2019-07-27 NOTE — Telephone Encounter (Signed)
Patient called to request refill of Prozac. Sent to [facility]. Nat Christen, CMA

## 2019-08-21 ENCOUNTER — Telehealth (INDEPENDENT_AMBULATORY_CARE_PROVIDER_SITE_OTHER): Payer: Self-pay

## 2019-08-21 NOTE — Telephone Encounter (Signed)
Patient called to make a medication refill for   propranolol (INDERAL) 40 MG tablet   Patient uses [place], [place]   Please advice [phone]  Thank you Whitney Post

## 2019-08-21 NOTE — Telephone Encounter (Signed)
Patient needs an in person appt he is not taking his medications consisitantly

## 2019-08-21 NOTE — Telephone Encounter (Signed)
FWD to PCP. Tempestt S Roberts, CMA  

## 2019-08-22 NOTE — Telephone Encounter (Signed)
Patient unavailable for in patient appointment due to transportation issues. Nat Christen, CMA

## 2019-08-23 ENCOUNTER — Other Ambulatory Visit (INDEPENDENT_AMBULATORY_CARE_PROVIDER_SITE_OTHER): Payer: Self-pay | Admitting: Primary Care

## 2019-08-23 MED ORDER — PROPRANOLOL HCL 40 MG PO TABS: 40.0000 mg | ORAL_TABLET | Freq: Two times a day (BID) | ORAL | 3 refills | Status: AC

## 2019-11-19 ENCOUNTER — Other Ambulatory Visit (INDEPENDENT_AMBULATORY_CARE_PROVIDER_SITE_OTHER): Payer: Self-pay | Admitting: Primary Care

## 2019-11-19 NOTE — Telephone Encounter (Signed)
FWD to PCP

## 2019-11-20 ENCOUNTER — Other Ambulatory Visit (INDEPENDENT_AMBULATORY_CARE_PROVIDER_SITE_OTHER): Payer: Self-pay | Admitting: Primary Care

## 2019-12-27 ENCOUNTER — Observation Stay (HOSPITAL_COMMUNITY)
Admission: AD | Admit: 2019-12-27 | Discharge: 2019-12-28 | Disposition: A | Discharge status: Home / Self Care | Payer: No Typology Code available for payment source | Attending: Psychiatry | Admitting: Psychiatry

## 2019-12-27 DIAGNOSIS — F1014 Alcohol abuse with alcohol-induced mood disorder: Secondary | ICD-10-CM | POA: Diagnosis present

## 2019-12-27 DIAGNOSIS — Z79899 Other long term (current) drug therapy: Secondary | ICD-10-CM | POA: Insufficient documentation

## 2019-12-27 DIAGNOSIS — Z888 Allergy status to other drugs, medicaments and biological substances status: Secondary | ICD-10-CM | POA: Insufficient documentation

## 2019-12-27 DIAGNOSIS — R45851 Suicidal ideations: Secondary | ICD-10-CM | POA: Insufficient documentation

## 2019-12-27 DIAGNOSIS — F22 Delusional disorders: Secondary | ICD-10-CM | POA: Insufficient documentation

## 2019-12-27 DIAGNOSIS — Z8249 Family history of ischemic heart disease and other diseases of the circulatory system: Secondary | ICD-10-CM | POA: Insufficient documentation

## 2019-12-27 DIAGNOSIS — Z880 Allergy status to penicillin: Secondary | ICD-10-CM | POA: Insufficient documentation

## 2019-12-27 DIAGNOSIS — F319 Bipolar disorder, unspecified: Secondary | ICD-10-CM | POA: Insufficient documentation

## 2019-12-27 DIAGNOSIS — J45909 Unspecified asthma, uncomplicated: Secondary | ICD-10-CM | POA: Insufficient documentation

## 2019-12-27 DIAGNOSIS — F17210 Nicotine dependence, cigarettes, uncomplicated: Secondary | ICD-10-CM | POA: Insufficient documentation

## 2019-12-27 DIAGNOSIS — F4310 Post-traumatic stress disorder, unspecified: Secondary | ICD-10-CM | POA: Insufficient documentation

## 2019-12-27 DIAGNOSIS — I10 Essential (primary) hypertension: Secondary | ICD-10-CM | POA: Insufficient documentation

## 2019-12-27 DIAGNOSIS — F1024 Alcohol dependence with alcohol-induced mood disorder: Secondary | ICD-10-CM | POA: Insufficient documentation

## 2019-12-27 DIAGNOSIS — Z20822 Contact with and (suspected) exposure to covid-19: Secondary | ICD-10-CM | POA: Insufficient documentation

## 2019-12-27 DIAGNOSIS — F10239 Alcohol dependence with withdrawal, unspecified: Principal | ICD-10-CM | POA: Insufficient documentation

## 2019-12-27 LAB — RESPIRATORY PANEL BY RT PCR (FLU A&B, COVID)
Influenza A by PCR: NEGATIVE
Influenza B by PCR: NEGATIVE
SARS Coronavirus 2 by RT PCR: NEGATIVE

## 2019-12-27 NOTE — BH Assessment (Addendum)
Assessment Note  Steven Daniels is an 59 y.o. male.  Pt presented to [facility] as a walk in voluntarily for alcohol use, suicidal thoughts. Pt states that he recently relapsed a few days ago after being clean for over a year. Pt states that he drank 5 40 oz beers and used meth (about $40 worth) . Pt states that he drinks about 4 to 6 40 oz beers daily. Pt reports passive suicidal ideation, he states, " Im tired of my situation, I cant seem to get well no matter what I do, If I leave here I am not safe to myself". Pt states that if he leaves hospital he is just going to keep drinking which would put him in a dangerous situation he feels. Pt denies HI and self injurious behaviors but does report active AVH, says he hears people, music playing as well see's critters. Pt states he only hallucinates when he is abusing drugs and alcohol. Pt also reports constant paranoia, thinking someone is out to get him as well as decreased grooming. Pt reports he was released from [facility] 2 days ago for episode , he was paranoid and hallucinating as well as suicidal. Pt reports that he sleeps a lot during the day when he is not drinking and is severely depressed. He reports symptoms of isolation, irritability, anxiety, feelings of worthlessness and hopelessness. Pt states that he has been isolating self and was staying at [facility] but relapsed there. Pt states that he wish he could attend AA meetings in person, its been difficult with COVID doing virtual meetings. Pt reports he currently has provider at [facility] for therapy but has not psychiatrist. Pt states that he takes Prozac, Neurtoin and blood pressure pills. Pt states that he is prescribed meds everytime he is discharged from a hospital but does not have permament provider. Pt also stats he has been to several rehab places last few months. Pt reports no past SI attempts. Pt has previous psychatric inpatient treatment history was last admitted to [facility] 0 04/2019 for  similar presentation. At this time pt feels that he can not keep himself safe due to constant urge to drink.  Pt referred for assessment by [facility], per [facility] staff [place], pt can return to [facility] after being medically cleared and receiving assessment at [facility].  Diagnosis: Alcohol-induced depressive disorder, With moderate or severe use disorder, Bipolar Disorder 1  Past Medical History:  Past Medical History:  Diagnosis Date  . Alcohol abuse   . Bipolar affective disorder ([place])   . Depression   . Hard of hearing   . Heart murmur    per pt 12/28/15  . Hepatitis C   . Hypertension   . Seizures ([place])     Past Surgical History:  Procedure Laterality Date  . HIP FRACTURE SURGERY Right    x 3  . KNEE SURGERY Right     Family History:  Family History  Problem Relation Age of Onset  . Other Mother        brain tumor behind left ear  . Alzheimer's disease Mother   . Skin cancer Father        melatomia  . Breast cancer Maternal Grandmother   . Heart disease Paternal Uncle   . Heart disease Cousin        fathers side  . Colon cancer Neg Hx   . Esophageal cancer Neg Hx     Social History:  reports that he has been smoking cigarettes.  He has been smoking about 1.00 pack per day. He has never used smokeless tobacco. He reports current alcohol use. He reports previous drug use. Drug: Cocaine.  Additional Social History:  Alcohol / Drug Use Pain Medications: see MAR Prescriptions: see MAR Over the Counter: see MAR History of alcohol / drug use?: Yes Substance #1 Name of Substance 1: meht  CIWA: CIWA-Ar BP: 139/87 Pulse Rate: (!) 50 COWS:    Allergies:  Allergies  Allergen Reactions  . Other Shortness Of Breath    Reaction to cat dander  . Apple Swelling and Other (See Comments)    Raw apples cause gum swelling  . Cherry Swelling and Other (See Comments)    Raw cherries cause gum swelling  . Plum Pulp Swelling and Other (See Comments)    Raw plums cause gum  swelling  . Lisinopril Hives and Rash  . Penicillins Rash    Allergic reaction as a 59 year old Has patient had a PCN reaction causing immediate rash, facial/tongue/throat swelling, SOB or lightheadedness with hypotension: Yes Has patient had a PCN reaction causing severe rash involving mucus membranes or skin necrosis: No Has patient had a PCN reaction that required hospitalization: No Has patient had a PCN reaction occurring within the last 10 years: No If all of the above answers are "NO", then may proceed with Cephalosporin use.    Home Medications: (Not in a hospital admission)   OB/GYN Status:  No LMP for male patient.  General Assessment Data Location of Assessment: [facility] Assessment Services TTS Assessment: In system Is this a Tele or Face-to-Face Assessment?: Face-to-Face Is this an Initial Assessment or a Re-assessment for this encounter?: Initial Assessment Patient Accompanied by:: N/A Language Other than English: No What gender do you identify as?: Male Marital status: Single Pregnancy Status: No Living Arrangements: Alone Can pt return to current living arrangement?: No Admission Status: Voluntary Is patient capable of signing voluntary admission?: Yes Referral Source: Self/Family/Friend     Crisis Care Plan Living Arrangements: Alone Legal Guardian: (self) Name of Psychiatrist: none Name of Therapist: [facility]  Education Status Is patient currently in school?: No Is the patient employed, unemployed or receiving disability?: Unemployed  Risk to self with the past 6 months Suicidal Ideation: Yes-Currently Present Has patient been a risk to self within the past 6 months prior to admission? : Yes Suicidal Intent: No Has patient had any suicidal intent within the past 6 months prior to admission? : No Is patient at risk for suicide?: No Suicidal Plan?: No Has patient had any suicidal plan within the past 6 months prior to admission? : Yes Access to Means:  Yes Specify Access to Suicidal Means: alcohol  Risk to Others within the past 6 months Homicidal Ideation: No Does patient have any lifetime risk of violence toward others beyond the six months prior to admission? : No Thoughts of Harm to Others: No Current Homicidal Intent: No Current Homicidal Plan: No Access to Homicidal Means: No Identified Victim: NA History of harm to others?: No Assessment of Violence: None Noted Violent Behavior Description: NA Does patient have access to weapons?: No Criminal Charges Pending?: No Does patient have a court date: No Is patient on probation?: No  Psychosis Hallucinations: Auditory, Visual Delusions: None noted  Mental Status Report Appearance/Hygiene: Unremarkable       Disposition: Adaku, Anike, FNP recommends overnight observation reassess in the morning. Per [facility] pt roomed at [facility] room 203.    On Site Evaluation [id number] Lylie Blacklock,  LCSWA Reviewed with Physician:  Renaye Rakers, FNP  Claria Dice Janayah Zavada 12/27/2019 9:01 PM

## 2019-12-27 NOTE — H&P (Signed)
BH Observation Unit Provider Admission PAA/H&P  Patient Identification: Steven Daniels MRN:  [id number] Date of Evaluation:  12/27/2019 Chief Complaint:  ETOH use disorder; MDD disorder Principal Diagnosis: Alcohol abuse with alcohol-induced mood disorder (HCC) Diagnosis:  Principal Problem:   Alcohol abuse with alcohol-induced mood disorder (HCC)  History of Present Illness:   Steven Daniels is an 59 y.o. male who presents voluntarily to [facility] with complaints of alcohol abuse and suicidal ideation. 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 He reports his last use was meth worth $40 and about 4-6 40 oz beers (daily use). Pt states "I'm tired of my situation and can't seem to get well no matter what I do".  Pt states that he if let go from the hospital, he will continue drinking and he knows this will put him in a dangerous situation. Pt was jus discharged from [facility] 2 days ago for similar complaints; he was also paranoid and hallucinating. Pt reports that [facility] ago and [facility]. He endorses current suicidal ideation without a plan. He denies HI. Pt reports he only sees and hears things when he is using drugs and alcohol. He denies any history of self harm or suicidal attempt. He endorses paranoia, thinks someone is out to get him. Pt reports he feels depressed, anxious, hopeless, helpless, irritable and isolates himself. Pt states he has been been doing well before COVID and he could not attend AA meetings in person but virtually which caused him to relapse. Pt states he sees he sees a therapist through [facility] but has no psychiatrist. Pt has a history of inpatient psychiatric admission, he was in [facility] 04/2019. Pt states he does not think he will get accepted back into sober living due to his behavior. Pt has  a referral for assessment by [facility], per [facility], pt can return to [facility] after being medically cleared and receiving assessment at [facility]. He states he cannot contract for safety at this time.   During evaluation is sitting; he is alert/oriented x 4; calm/cooperative; and mood is depressed/anxious congruent with affect. Pt is speaking in a clear tone at moderate volume, and normal pace; with good eye contact. His thought process is coherent and relevant; There is no indication that he is currently responding to internal/external stimuli or experiencing delusional thought content.  Associated Signs/Symptoms: Depression Symptoms:  depressed mood, feelings of worthlessness/guilt, hopelessness, suicidal thoughts without plan, anxiety, (Hypo) Manic Symptoms:  Impulsivity, Anxiety Symptoms:  Excessive Worry, Psychotic Symptoms:  Hallucinations: Auditory Visual PTSD Symptoms: Hypervigilance:  Yes Total Time spent with patient: 30 minutes  Past Psychiatric History: Yes  Is the patient at risk to self? Yes.    Has the patient been a risk to self in the past 6 months? No.  Has the patient been a risk to self within the distant past? No.  Is the patient a risk to others? No.  Has the patient been a risk to others in the past 6 months? No.  Has the patient been a risk to others within the distant past? No.   Prior Inpatient Therapy: Prior Inpatient Therapy: Yes Prior Therapy Dates: (06/ 2020) Prior Therapy Facilty/Provider(s): [facility] Reason for Treatment: alcohol abuse Prior Outpatient Therapy: Prior Outpatient Therapy: Yes Prior Therapy Dates: (various 2020) Prior Therapy Facilty/Provider(s): ([facility]) Reason for Treatment: alcohol abuse Does patient have an ACCT team?: No Does patient have Intensive In-House Services?  : No Does patient have [facility] services? : No Does patient have P4CC services?: No  Alcohol Screening:   Substance Abuse History in the last 12  months:  Yes.   Consequences of Substance Abuse: DT's: tremors, hallucination Loss of housing Previous Psychotropic Medications: Prozac Psychological Evaluations: Yes  Past Medical History:  Past Medical History:  Diagnosis Date  . Alcohol abuse   . Bipolar affective disorder (HCC)   . Depression   . Hard of hearing   . Heart murmur    per pt 12/28/15  . Hepatitis C   . Hypertension   . Seizures (HCC)     Past Surgical History:  Procedure Laterality Date  . HIP FRACTURE SURGERY Right    x 3  . KNEE SURGERY Right    Family History:  Family History  Problem Relation Age of Onset  . Other Mother        brain tumor behind left ear  . Alzheimer's disease Mother   . Skin cancer Father        melatomia  . Breast cancer Maternal Grandmother   . Heart disease Paternal Uncle   . Heart disease Cousin        fathers side  . Colon cancer Neg Hx   . Esophageal cancer Neg Hx    Family Psychiatric History: Unknown Tobacco Screening:   Social History:  Social History   Substance and Sexual Activity  Alcohol Use Yes   Comment: patient states he is in rehab     Social History   Substance and Sexual Activity  Drug Use Not Currently  . Types: Cocaine   Comment: crack, heroin -snort last use 12/27/2017    Additional Social History: Marital status: Single    Pain Medications: see MAR Prescriptions: see MAR Over the Counter: see MAR History of alcohol / drug use?: Yes Name of Substance 1: meht                  Allergies:   Allergies  Allergen Reactions  . Other Shortness Of Breath    Reaction to cat dander  . Apple Swelling and Other (See Comments)    Raw apples cause gum swelling  . Cherry Swelling and Other (See Comments)    Raw cherries cause gum swelling  . Plum Pulp Swelling and Other (See Comments)    Raw plums cause gum swelling  . Lisinopril Hives and Rash  . Penicillins Rash    Allergic reaction as a 59 year old Has patient had a PCN reaction  causing immediate rash, facial/tongue/throat swelling, SOB or lightheadedness with hypotension: Yes Has patient had a PCN reaction causing severe rash involving mucus membranes or skin necrosis: No Has patient had a PCN reaction that required hospitalization: No Has patient had a PCN reaction occurring within the last 10 years: No If all of the above answers are "NO", then may proceed with Cephalosporin use.   Lab Results: No results found for this or any previous visit (from the past 48 hour(s)).  Blood Alcohol level:  Lab Results  Component Value Date   ETH 105 (H) 05/08/2019   ETH 28 (H) 02/18/2018    Metabolic Disorder Labs:  Lab Results  Component Value Date   HGBA1C 6.0 (H) 05/10/2019   MPG 125.5 05/10/2019   MPG 114.02 02/20/2018   No results found for: PROLACTIN Lab Results  Component Value Date   CHOL 192 02/20/2018   TRIG 142 02/20/2018   HDL 79 02/20/2018   CHOLHDL 2.4 02/20/2018   VLDL 28 02/20/2018   LDLCALC 85 02/20/2018   LDLCALC 124 (H) 12/30/2015    Current Medications: No current facility-administered medications for this encounter.   PTA Medications: Medications Prior to Admission  Medication Sig Dispense Refill Last Dose  . albuterol (PROVENTIL HFA;VENTOLIN HFA) 108 (90 Base) MCG/ACT inhaler Inhale 2 puffs into the lungs 4 (four) times daily as needed for wheezing or shortness of breath. 1 Inhaler 3   . FLUoxetine (PROZAC) 40 MG capsule Take 1 capsule (40 mg total) by mouth daily. 30 capsule 3   . gabapentin (NEURONTIN) 300 MG capsule Take 1 capsule (300 mg total) by mouth 3 (three) times daily. 90 capsule 0   . Multiple Vitamin-Folic Acid TABS Take 1 tablet by mouth daily with lunch.      . nicotine (NICODERM CQ - DOSED IN MG/24 HOURS) 21 mg/24hr patch Place 1 patch (21 mg total) onto the skin daily. 28 patch 0   . pantoprazole (PROTONIX) 40 MG tablet TAKE 1 TABLET BY MOUTH DAILY AS NEEDED 30 tablet 1   . propranolol (INDERAL) 40 MG tablet Take 1  tablet (40 mg total) by mouth 2 (two) times daily. 60 tablet 3   . thiamine (VITAMIN B-1) 100 MG tablet Take 100 mg by mouth daily.       Musculoskeletal: Strength & Muscle Tone: within normal limits Gait & Station: normal Patient leans: N/A  Psychiatric Specialty Exam: Physical Exam  Constitutional: He is oriented to person, place, and time. He appears well-developed and well-nourished.  HENT:  Head: Normocephalic.  Eyes: Pupils are equal, round, and reactive to light.  Respiratory: Effort normal.  Musculoskeletal:        General: Normal range of motion.     Cervical back: Normal range of motion.  Neurological: He is alert and oriented to person, place, and time.  Skin: Skin is warm and dry.  Psychiatric: His speech is normal. His mood appears anxious. He is actively hallucinating. Cognition and memory are normal. He expresses impulsivity. He exhibits a depressed mood. He expresses suicidal ideation. Plan of homicide: none.    Review of Systems  Psychiatric/Behavioral: Positive for hallucinations and suicidal ideas. Negative for agitation, confusion, self-injury and sleep disturbance. The patient is nervous/anxious. The patient is not hyperactive.   All other systems reviewed and are negative.   Blood pressure 139/87, pulse (!) 50, temperature 98 F (36.7 C), temperature source Oral, resp. rate 18, SpO2 94 %.There is no height or weight on file to calculate BMI.  General Appearance: Casual  Eye Contact:  Good  Speech:  Normal Rate  Volume:  Normal  Mood:  Anxious, Depressed and Hopeless  Affect:  Congruent and Depressed  Thought Process:  Coherent and Descriptions of Associations: Intact  Orientation:  Full (Time, Place, and Person)  Thought Content:  Hallucinations: Auditory Visual  Suicidal Thoughts:  Yes.  without intent/plan  Homicidal Thoughts:  No  Memory:  Recent;   Fair  Judgement:  Fair  Insight:  Fair  Psychomotor Activity:  Normal  Concentration:   Concentration: Good  Recall:  Fair  Fund of Knowledge:  Good  Language:  Good  Akathisia:  No  Handed:  Right  AIMS (if indicated):     Assets:  Communication Skills Desire for Improvement Social Support  ADL's:  Intact  Cognition:  WNL  Sleep:  Disposition: Recommend overnight observation for stabilization and monitoring. Supportive therapy provided about ongoing stressors.  Treatment Plan Summary: Daily contact with patient to assess and evaluate symptoms and progress in treatment and Medication management  Observation Level/Precautions:  15 minute checks Laboratory:  Chemistry Profile UDS Psychotherapy:   Medications:   Consultations:   Discharge Concerns:   Estimated LOS: Other:      Mliss Fritz, NP 1/28/202111:16 PM

## 2019-12-28 ENCOUNTER — Ambulatory Visit (HOSPITAL_COMMUNITY): Payer: Self-pay

## 2019-12-28 ENCOUNTER — Encounter (HOSPITAL_COMMUNITY): Payer: Self-pay | Admitting: Behavioral Health

## 2019-12-28 ENCOUNTER — Other Ambulatory Visit: Payer: Self-pay

## 2019-12-28 LAB — COMPREHENSIVE METABOLIC PANEL
ALT: 24 U/L (ref 0–44)
AST: 21 U/L (ref 15–41)
Albumin: 3.7 g/dL (ref 3.5–5.0)
Alkaline Phosphatase: 103 U/L (ref 38–126)
Anion gap: 9 (ref 5–15)
BUN: 9 mg/dL (ref 6–20)
CO2: 34 mmol/L — ABNORMAL HIGH (ref 22–32)
Calcium: 8.9 mg/dL (ref 8.9–10.3)
Chloride: 98 mmol/L (ref 98–111)
Creatinine, Ser: 0.58 mg/dL — ABNORMAL LOW (ref 0.61–1.24)
GFR calc Af Amer: >60 mL/min (ref >60)
GFR calc non Af Amer: >60 mL/min (ref >60)
Glucose, Bld: 117 mg/dL — ABNORMAL HIGH (ref 70–99)
Potassium: 3.4 mmol/L — ABNORMAL LOW (ref 3.5–5.1)
Sodium: 141 mmol/L (ref 135–145)
Total Bilirubin: 0.3 mg/dL (ref 0.3–1.2)
Total Protein: 7.1 g/dL (ref 6.5–8.1)

## 2019-12-28 LAB — LIPID PANEL
Cholesterol: 207 mg/dL — ABNORMAL HIGH (ref 0–200)
HDL: 57 mg/dL (ref >40)
LDL Cholesterol: 124 mg/dL — ABNORMAL HIGH (ref 0–99)
Total CHOL/HDL Ratio: 3.6 RATIO
Triglycerides: 128 mg/dL (ref <150)
VLDL: 26 mg/dL (ref 0–40)

## 2019-12-28 LAB — HEPATIC FUNCTION PANEL
ALT: 25 U/L (ref 0–44)
AST: 21 U/L (ref 15–41)
Albumin: 3.8 g/dL (ref 3.5–5.0)
Alkaline Phosphatase: 104 U/L (ref 38–126)
Bilirubin, Direct: 0.1 mg/dL (ref 0.0–0.2)
Indirect Bilirubin: 1.1 mg/dL — ABNORMAL HIGH (ref 0.3–0.9)
Total Bilirubin: 1.2 mg/dL (ref 0.3–1.2)
Total Protein: 7 g/dL (ref 6.5–8.1)

## 2019-12-28 LAB — HEMOGLOBIN A1C
Hgb A1c MFr Bld: 5.7 % — ABNORMAL HIGH (ref 4.8–5.6)
Mean Plasma Glucose: 116.89 mg/dL

## 2019-12-28 LAB — MAGNESIUM: Magnesium: 2.2 mg/dL (ref 1.7–2.4)

## 2019-12-28 LAB — ETHANOL: Alcohol, Ethyl (B): <10 mg/dL (ref <10)

## 2019-12-28 LAB — TSH: TSH: 0.641 u[IU]/mL (ref 0.350–4.500)

## 2019-12-28 MED ORDER — HYDROXYZINE HCL 25 MG PO TABS
25.0000 mg | ORAL_TABLET | Freq: Three times a day (TID) | ORAL | Status: DC | PRN
  Filled 2019-12-28: qty 1

## 2019-12-28 MED ORDER — MAGNESIUM HYDROXIDE 400 MG/5ML PO SUSP: 30.0000 mL | Freq: Every day | ORAL | Status: DC | PRN

## 2019-12-28 MED ORDER — TRAZODONE HCL 50 MG PO TABS: 50.0000 mg | ORAL_TABLET | Freq: Every evening | ORAL | Status: DC | PRN

## 2019-12-28 MED ORDER — ALUM & MAG HYDROXIDE-SIMETH 200-200-20 MG/5ML PO SUSP: 30.0000 mL | ORAL | Status: DC | PRN

## 2019-12-28 MED ORDER — ACETAMINOPHEN 325 MG PO TABS: 650.0000 mg | ORAL_TABLET | Freq: Four times a day (QID) | ORAL | Status: DC | PRN

## 2019-12-28 NOTE — Progress Notes (Signed)
Pt discharged to lobby. Pt was stable and appreciative at that time. All papers  were given and valuables returned. Verbal understanding expressed. Denies SI/HI and A/VH. Pt given opportunity to express concerns and ask questions. °

## 2019-12-28 NOTE — Patient Outreach (Signed)
CPSS met with the patient in order to provide substance use recovery support and provide information for substance use recovery resources. Patient reports a history of alcohol and methamphetamine use. Patient's EtOH test was negative. Patient's UDS was not completed. Patient has a long history of being connected to substance use treatment options. Patient states he has been to [place], [place] and [facility] in the past for substance use treatment. Patient has struggled with hope regarding maintaining long-term substance use recovery abstinence due to a long history relapsing. Patient also has struggled with attending 12-step recovery meetings due to [place] concerns and online 12-step meetings not being as beneficial for the patient's substance use recovery process like in-person 12-step meetings. CPSS provided motivational interviewing and highlighted the patient's strengths when it comes to the patient's substance use recovery process. Patient also plans to follow up with outpatient [facility] in [place] and is already set up with substance use recovery housing. CPSS also provided the patient with additional substance use recovery resources including residential/outpatient substance use treatment center list, [facility] AA/NA online meeting list, and CPSS contact information. CPSS strongly encouraged the patient to follow up with CPSS if needed for further substance use recovery support.

## 2019-12-28 NOTE — Progress Notes (Signed)
Patient ID: Steven Daniels, male   DOB: 01-Jul-1961, 59 y.o.   MRN: [id number] Pt brought to the unit by another nurse. Pt appeared calm and cooperative. Pt asked for drink which was providered. Pt resting in bed.

## 2019-12-28 NOTE — Discharge Summary (Signed)
Physician Discharge Summary Note  Patient:  Steven Daniels is an 59 y.o., male MRN:  [id number] DOB:  Jul 28, 1961 Patient phone:  [phone] (home)  Patient address:   [place] [address],  Total Time spent with patient: 45 minutes  Date of Admission:  12/27/2019 Date of Discharge: 12/28/2019  Reason for Admission:   Steven Daniels an 59 y.o.male who presents voluntarily to [facility] with complaints of alcohol abuse and suicidal ideation. 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 He reports his last use was meth worth $40 and about 4-6 40 oz beers (daily use). Pt states "I'm tired of my situation and can't seem to get well no matter what I do".  Pt states that he if let go from the hospital, he will continue drinking and he knows this will put him in a dangerous situation. Pt was jus discharged from [facility] 2 days ago for similar complaints; he was also paranoid and hallucinating. Pt reports that [facility] and [facility] he currently lives. He endorses current suicidal ideation without a plan. He denies HI. Pt reports he only sees and hears things when he is using drugs and alcohol. He denies any history of self harm or suicidal attempt. He endorses paranoia, thinks someone is out to get him. Pt reports he feels depressed, anxious, hopeless, helpless, irritable and isolates himself. Pt states he has been been doing well before COVID and he could not attend AA meetings in person but virtually which caused him to relapse. Pt states he sees he sees a therapist through [facility] but has no psychiatrist. Pt has a history of inpatient psychiatric admission, he was in [facility] 04/2019. Pt states he does not think he will get accepted back into sober living due to his behavior. Pt has a referral for assessment by [facility], per  [facility], pt can return to [facility] after being medically cleared and receiving assessment at [facility]. He states he cannot contract for safety at this time.   During evaluation is sitting; he is alert/oriented x 4; calm/cooperative; and mood is depressed/anxious congruent with affect. Pt is speaking in a clear tone at moderate volume, and normal pace; with good eye contact. His thought process is coherent and relevant; There is no indication that he is currently responding to internal/external stimuli or experiencing delusional thought content. Principal Problem: Alcohol abuse with alcohol-induced mood disorder [facility]) Discharge Diagnoses: Principal Problem:   Alcohol abuse with alcohol-induced mood disorder [facility])   Past Psychiatric History:last here 04/2019  Past Medical History:  Past Medical History:  Diagnosis Date  . Alcohol abuse   . Bipolar affective disorder ([place])   . Depression   . Hard of hearing   . Heart murmur    per pt 12/28/15  . Hepatitis C   . Hypertension   . Seizures ([place])     Past Surgical History:  Procedure Laterality Date  . HIP FRACTURE SURGERY Right    x 3  . KNEE SURGERY Right    Family History:  Family History  Problem Relation Age of Onset  . Other Mother        brain tumor behind left ear  . Alzheimer's disease Mother   . Skin cancer Father        melatomia  . Breast cancer Maternal  Grandmother   . Heart disease Paternal Uncle   . Heart disease Cousin        fathers side  . Colon cancer Neg Hx   . Esophageal cancer Neg Hx    Family Psychiatric  History: see chart Social History:  Social History   Substance and Sexual Activity  Alcohol Use Yes   Comment: patient states he is in rehab     Social History   Substance and Sexual Activity  Drug Use Not Currently  . Types: Cocaine   Comment: crack, heroin -snort last use 12/27/2017    Social History   Socioeconomic History  . Marital status: Single    Spouse name: Not on file   . Number of children: 0  . Years of education: Not on file  . Highest education level: Not on file  Occupational History  . Occupation: disabled  Tobacco Use  . Smoking status: Current Every Day Smoker    Packs/day: 1.00    Types: Cigarettes  . Smokeless tobacco: Never Used  Substance and Sexual Activity  . Alcohol use: Yes    Comment: patient states he is in rehab  . Drug use: Not Currently    Types: Cocaine    Comment: crack, heroin -snort last use 12/27/2017  . Sexual activity: Not Currently    Birth control/protection: None  Other Topics Concern  . Not on file  Social History Narrative   Divorced, no children   Veteran of 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  States intention to quit currently started opiates after her first hip surgery   He is a smoker   11/23/2017      Social Determinants of Health   Financial Resource Strain:   . Difficulty of Paying Living Expenses: Not on file  Food Insecurity:   . Worried About Programme researcher, broadcasting/film/video in the Last Year: Not on file  . Ran Out of Food in the Last Year: Not on file  Transportation Needs:   . Lack of Transportation (Medical): Not on file  . Lack of Transportation (Non-Medical): Not on file  Physical Activity:   . Days of Exercise per Week: Not on file  . Minutes of Exercise per Session: Not on file  Stress:   . Feeling of Stress : Not on file  Social Connections:   . Frequency of Communication with Friends and Family: Not on file  . Frequency of Social Gatherings with Friends and Family: Not on file  . Attends Religious Services: Not on file  . Active Member of Clubs or Organizations: Not on file  . Attends Banker Meetings: Not on file  . Marital Status: Not on file    Hospital Course:   Patient was admitted to the observation unit and monitored overnight.  He  acknowledged auditory hallucinations due to relapsing on methamphetamines but stated "they are going away now and will be gone by the afternoon" when evaluated on 1/29.  Patient's had multiple admissions here and elsewhere with chronic suicidal thoughts chronic substance abuse states he did not follow-up like he should have been relapsed but he states "I am not going to hurt myself" but acknowledges that he is always had suicidal thoughts due to his frustration and drinking.  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  He has baseline hypertension.  Again can contract fully and not meeting criteria for inpatient care    Physical Findings: AIMS: Facial and Oral Movements Muscles of Facial Expression: None, normal Lips and Perioral Area: None, normal Jaw: None, normal Tongue: None, normal,Extremity Movements Upper (arms, wrists, hands, fingers): None, normal Lower (legs, knees, ankles, toes): None, normal, Trunk Movements Neck, shoulders, hips: None, normal, Overall Severity Severity of abnormal movements (highest score from questions above): None, normal Incapacitation due to abnormal movements: None, normal Patient's awareness of abnormal movements (rate only patient's report): No Awareness, Dental Status Current problems with teeth and/or dentures?: No Does patient usually wear dentures?: No  CIWA:  CIWA-Ar Total: 0 COWS:  COWS Total Score: 0  Musculoskeletal: Strength & Muscle Tone: within normal limits Gait & Station: normal Patient leans: N/A  Psychiatric Specialty Exam: Physical Exam  Review of Systems  Blood pressure (!) 144/94, pulse 87, temperature 97.6 F (36.4 C), resp. rate 18, height 6\' 1"  (1.854 m), weight 117 kg, SpO2 96 %.Body mass index is 34.04 kg/m.  General Appearance: Casual  Eye Contact:  Good  Speech:  Clear and Coherent  Volume:  Normal  Mood:  Dysphoric   Affect:  Constricted  Thought Process:  Coherent and Goal Directed  Orientation:  Full (Time, Place, and Person)  Thought Content:  Hallucinations: Auditory-states "they are going away" and will be gone by the afternoon  Suicidal Thoughts:  No intent-  states "I always have suicidal thoughts because I am tired of drinking, but I am not going to do anything to myself"  Homicidal Thoughts:  No  Memory:  Immediate;   Fair Recent;   Good Remote;   Fair  Judgement:  Fair  Insight:  Fair  Psychomotor Activity:  Normal  Concentration:  Concentration: Fair and Attention Span: Fair  Recall:  of Knowledge:  Fair  Language:  Fair  Akathisia:  Negative  Handed:  Right  AIMS (if indicated):     Assets:  Communication Skills Desire for Improvement  ADL's:  Intact  Cognition:  WNL  Sleep:           Has this patient used any form of tobacco in the last 30 days? (Cigarettes, Smokeless Tobacco, Cigars, and/or Pipes) Yes, No  Blood Alcohol level:  Lab Results  Component Value Date   ETH <10 12/28/2019   ETH 105 (H) 05/08/2019    Metabolic Disorder Labs:  Lab Results  Component Value Date   HGBA1C 5.7 (H) 12/28/2019   MPG 116.89 12/28/2019   MPG 125.5 05/10/2019   No results found for: PROLACTIN Lab Results  Component Value Date   CHOL 207 (H) 12/28/2019   TRIG 128 12/28/2019   HDL 57 12/28/2019   CHOLHDL 3.6 12/28/2019   VLDL 26 12/28/2019   LDLCALC 124 (H) 12/28/2019   LDLCALC 85 02/20/2018    See Psychiatric Specialty Exam and Suicide Risk Assessment completed by Attending Physician prior to discharge.  Discharge destination:  Home  Is patient on multiple antipsychotic therapies at discharge:  No   Has Patient had three or more failed trials of antipsychotic monotherapy by history:  No  Recommended Plan for Multiple Antipsychotic Therapies: NA   Allergies as of 12/28/2019      Reactions   Other Shortness Of Breath   Reaction to cat dander   Apple  Swelling, Other (See Comments)   Raw apples cause gum swelling   Cherry Swelling,  Other (See Comments)   Raw cherries cause gum swelling   Plum Pulp Swelling, Other (See Comments)   Raw plums cause gum swelling   Lisinopril Hives, Rash   Penicillins Rash   Allergic reaction as a 59 year old Has patient had a PCN reaction causing immediate rash, facial/tongue/throat swelling, SOB or lightheadedness with hypotension: Yes Has patient had a PCN reaction causing severe rash involving mucus membranes or skin necrosis: No Has patient had a PCN reaction that required hospitalization: No Has patient had a PCN reaction occurring within the last 10 years: No If all of the above answers are "NO", then may proceed with Cephalosporin use.      Medication List    TAKE these medications     Indication  albuterol 108 (90 Base) MCG/ACT inhaler Commonly known as: VENTOLIN HFA Inhale 2 puffs into the lungs 4 (four) times daily as needed for wheezing or shortness of breath.  Indication: Asthma   FLUoxetine 40 MG capsule Commonly known as: PROZAC Take 1 capsule (40 mg total) by mouth daily.  Indication: Major Depressive Disorder   gabapentin 300 MG capsule Commonly known as: NEURONTIN Take 1 capsule (300 mg total) by mouth 3 (three) times daily.  Indication: Alcohol Withdrawal Syndrome   Multiple Vitamin-Folic Acid Tabs Take 1 tablet by mouth daily with lunch.  Indication: Supplementation   nicotine 21 mg/24hr patch Commonly known as: NICODERM CQ - dosed in mg/24 hours Place 1 patch (21 mg total) onto the skin daily.  Indication: Nicotine Addiction   pantoprazole 40 MG tablet Commonly known as: PROTONIX TAKE 1 TABLET BY MOUTH DAILY AS NEEDED  Indication: Indigestion   propranolol 40 MG tablet Commonly known as: INDERAL Take 1 tablet (40 mg total) by mouth 2 (two) times daily.  Indication: High Blood Pressure Disorder   thiamine 100 MG tablet Commonly known as: VITAMIN B-1 Take 100  mg by mouth daily.  Indication: Supplementation      SignedMalvin Johns, MD 12/28/2019, 10:14 AM

## 2019-12-28 NOTE — BH Assessment (Signed)
[facility] Assessment Progress Note  Per Malvin Johns, MD, this pt does not require psychiatric hospitalization at this time.  Pt is to be discharged from the [facility] Observation Unit with outpatient referrals.  Pt lists an address in [place], [place].  Notes indicate that pt receives outpatient therapy from [facility].  Discharge instructions advise pt to continue treatment with the current therapist.  For psychiatry pt is advised to follow up with [facility] in [place] or with the [facility] if he has VA benefits.  Pt would also benefit from seeing Peer Support Specialists, and a peer support consult has been ordered for pt.  Pt's nurse has been notified.  Doylene Canning, MA Triage Specialist [phone]

## 2019-12-28 NOTE — Progress Notes (Signed)
Patient ID: Steven Daniels, male   DOB: 1961/03/20, 59 y.o.   MRN: [id number] Pt A&O x 4, presents with SI and alcohol abuse & Meth use.  Pt reports he drinks 4-6, 40 oz beers daily.  Pt reports he is tired of his situation and feels he is not safe by himself.  Pt calm & cooperative, interactive with staff.  Pt referred by [facility] for medical clearance and to return to [facility] for treatment.  Skin search completed, monitoring for safety, pending transfer to [address].

## 2019-12-28 NOTE — Plan of Care (Signed)
[facility] Observation Crisis Plan  Reason for Crisis Plan:  Crisis Stabilization   Plan of Care:  Referral for Inpatient Hospitalization  Family Support:      Current Living Environment:  Living Arrangements: Alone  Insurance:   Hospital Account    Name Acct ID Class Status Primary Coverage   [place], [place] [id number] BEHAVIORAL HEALTH OBSERVATION Open None        Guarantor Account (for Hospital Account [id number])    Name Relation to Pt Service Area Active? Acct Type   Simone Curia Self [facility] Yes [facility]   Address Phone       [address] [place], [place] [address] [phone](H)          Coverage Information (for Hospital Account [id number])    Not on file      Legal Guardian:  Legal Guardian: (self)  Primary Care Provider:  Grayce Sessions, NP  Current Outpatient Providers:  Gwinda Passe, NP  Psychiatrist:  Name of Psychiatrist: none  Counselor/Therapist:  Name of Therapist: [facility]  Compliant with Medications:  Yes  Additional Information:   Tasia Catchings 1/29/20211:29 AM

## 2019-12-28 NOTE — Discharge Instructions (Signed)
For your behavioral health needs, you are advised to continue treatment with your current outpatient therapist.  For medication management, you may benefit from seeing a psychiatrist.  Contact [facility] at your earliest opportunity to schedule an intake appointment:   [facility]  [address] [place], [place] [address]  [phone]  If you qualify for VA benefits, another option is the [facility]:       [facility]      [address] [place], [place] [address]      [phone]

## 2020-02-28 DEATH — deceased
